# Patient Record
Sex: Female | Born: 1940 | Race: Black or African American | Hispanic: No | Smoking: Former smoker
Health system: Southern US, Community
[De-identification: ages and names within clinical notes are randomized; demographics above are authoritative.]

## PROBLEM LIST (undated history)

## (undated) DIAGNOSIS — R05 Cough: Secondary | ICD-10-CM

## (undated) DIAGNOSIS — F0390 Unspecified dementia without behavioral disturbance: Secondary | ICD-10-CM

## (undated) DIAGNOSIS — T464X5A Adverse effect of angiotensin-converting-enzyme inhibitors, initial encounter: Secondary | ICD-10-CM

## (undated) DIAGNOSIS — M1990 Unspecified osteoarthritis, unspecified site: Secondary | ICD-10-CM

## (undated) DIAGNOSIS — I639 Cerebral infarction, unspecified: Secondary | ICD-10-CM

## (undated) DIAGNOSIS — R911 Solitary pulmonary nodule: Secondary | ICD-10-CM

## (undated) DIAGNOSIS — I10 Essential (primary) hypertension: Secondary | ICD-10-CM

## (undated) DIAGNOSIS — N179 Acute kidney failure, unspecified: Secondary | ICD-10-CM

## (undated) DIAGNOSIS — E785 Hyperlipidemia, unspecified: Secondary | ICD-10-CM

## (undated) DIAGNOSIS — R058 Other specified cough: Secondary | ICD-10-CM

## (undated) DIAGNOSIS — N390 Urinary tract infection, site not specified: Secondary | ICD-10-CM

## (undated) HISTORY — PX: FRACTURE SURGERY: SHX138

## (undated) HISTORY — DX: Essential (primary) hypertension: I10

---

## 1958-09-06 HISTORY — PX: BREAST LUMPECTOMY: SHX2

## 2004-05-17 ENCOUNTER — Emergency Department (HOSPITAL_COMMUNITY): Admission: EM | Admit: 2004-05-17 | Discharge: 2004-05-17 | Payer: Self-pay | Admitting: Emergency Medicine

## 2004-05-20 ENCOUNTER — Emergency Department (HOSPITAL_COMMUNITY): Admission: EM | Admit: 2004-05-20 | Discharge: 2004-05-20 | Payer: Self-pay | Admitting: Emergency Medicine

## 2004-06-18 ENCOUNTER — Encounter: Admission: RE | Admit: 2004-06-18 | Discharge: 2004-07-09 | Payer: Self-pay | Admitting: Orthopedic Surgery

## 2009-11-03 ENCOUNTER — Emergency Department (HOSPITAL_COMMUNITY): Admission: EM | Admit: 2009-11-03 | Discharge: 2009-11-03 | Payer: Self-pay | Admitting: Family Medicine

## 2012-10-09 ENCOUNTER — Emergency Department (INDEPENDENT_AMBULATORY_CARE_PROVIDER_SITE_OTHER)
Admission: EM | Admit: 2012-10-09 | Discharge: 2012-10-09 | Disposition: A | Discharge status: Home / Self Care | Payer: Medicare Other | Source: Home / Self Care | Attending: Emergency Medicine | Admitting: Emergency Medicine

## 2012-10-09 ENCOUNTER — Encounter (HOSPITAL_COMMUNITY): Payer: Self-pay | Admitting: Emergency Medicine

## 2012-10-09 ENCOUNTER — Emergency Department (INDEPENDENT_AMBULATORY_CARE_PROVIDER_SITE_OTHER): Payer: Medicare Other

## 2012-10-09 DIAGNOSIS — J329 Chronic sinusitis, unspecified: Secondary | ICD-10-CM

## 2012-10-09 DIAGNOSIS — J4489 Other specified chronic obstructive pulmonary disease: Secondary | ICD-10-CM

## 2012-10-09 DIAGNOSIS — J449 Chronic obstructive pulmonary disease, unspecified: Secondary | ICD-10-CM

## 2012-10-09 MED ORDER — AMOXICILLIN 500 MG PO CAPS: 500.0000 mg | ORAL_CAPSULE | Freq: Two times a day (BID) | ORAL | Status: DC

## 2012-10-09 MED ORDER — ALBUTEROL SULFATE HFA 108 (90 BASE) MCG/ACT IN AERS: 1.0000 | INHALATION_SPRAY | RESPIRATORY_TRACT | Status: DC | PRN

## 2012-10-09 NOTE — ED Notes (Signed)
Pt c/o cold sx x2 weeks Sx include: productive cough, chest congestion, nauseas, runny nose, nasal congestion Denies: f/v/d Smokes 1-2 cigarettes per day  She is alert w/no signs of acute distress.

## 2012-10-09 NOTE — ED Provider Notes (Signed)
History     CSN: [id number]  Arrival date & time 10/09/12  1022   None     Chief Complaint  Patient presents with  . URI    (Consider location/radiation/quality/duration/timing/severity/associated sxs/prior treatment) HPI Comments: Pt reports doesn't have regular doctor, doesn't usually seek health care, and doesn't have any health problems.  Cough, "cold" for 2.5 weeks. Smoker.  Patient is a 72 y.o. female presenting with cough.  Cough This is a new problem. Episode onset: 2.5 weeks ago. The problem occurs every few minutes. The problem has been gradually worsening. The cough is productive of sputum (sometimes clear, sometimes purulent). There has been no fever. Associated symptoms include rhinorrhea, shortness of breath and wheezing. Pertinent negatives include no chest pain, no chills, no ear congestion, no ear pain, no headaches and no sore throat. She has tried nothing for the symptoms. She is a smoker.    History reviewed. No pertinent past medical history.  History reviewed. No pertinent past surgical history.  History reviewed. No pertinent family history.  History  Substance Use Topics  . Smoking status: Current Every Day Smoker    Types: Cigarettes  . Smokeless tobacco: Not on file  . Alcohol Use: Yes    OB History    Grav Para Term Preterm Abortions TAB SAB Ect Mult Living                  Review of Systems  Constitutional: Negative for fever and chills.  HENT: Positive for congestion, rhinorrhea and sinus pressure. Negative for ear pain, sore throat and postnasal drip.   Respiratory: Positive for cough, shortness of breath and wheezing.   Cardiovascular: Negative for chest pain.  Neurological: Negative for headaches.    Allergies  Review of patient's allergies indicates no known allergies.  Home Medications   Current Outpatient Rx  Name  Route  Sig  Dispense  Refill  . ALBUTEROL SULFATE HFA 108 (90 BASE) MCG/ACT IN AERS   Inhalation   Inhale 1-2  puffs into the lungs every 4 (four) hours as needed for wheezing. Please dispense with spacer and teach patient to use   1 Inhaler   0   . AMOXICILLIN 500 MG PO CAPS   Oral   Take 1 capsule (500 mg total) by mouth 2 (two) times daily.   20 capsule   0     BP 140/75  Pulse 88  Temp 98.4 F (36.9 C) (Oral)  Resp 22  SpO2 93%  Physical Exam  Constitutional: She appears well-developed and well-nourished. No distress.  HENT:  Right Ear: External ear normal.  Left Ear: External ear normal.  Nose: Rhinorrhea present. No mucosal edema. Right sinus exhibits maxillary sinus tenderness. Right sinus exhibits no frontal sinus tenderness. Left sinus exhibits maxillary sinus tenderness. Left sinus exhibits no frontal sinus tenderness.  Mouth/Throat: Oropharynx is clear and moist.       B ear canals with cerumen, unable to see TMs  Cardiovascular: Normal rate and regular rhythm.   Pulmonary/Chest: No respiratory distress. She has rhonchi in the right upper field, the right middle field, the right lower field, the left upper field and the left lower field.    ED Course  Procedures (including critical care time)  Labs Reviewed - No data to display Dg Chest 2 View  10/09/2012  *RADIOLOGY REPORT*  Clinical Data: Cough.  Smoker.  CHEST - 2 VIEW  Comparison: None.  Findings: Trachea is midline.  Heart size normal.  Transverse aorta  appears prominent.  Lungs are hyperinflated but grossly clear.  No pleural fluid.  Old left rib fractures.  IMPRESSION: COPD without acute finding.   Original Report Authenticated By: Leanna Battles, M.D.      1. Sinusitis   2. COPD (chronic obstructive pulmonary disease)       MDM  Pt to find a pcp and f/u about COPD.         Cathlyn Parsons, NP 10/09/12 [phone]

## 2012-10-09 NOTE — ED Provider Notes (Signed)
Medical screening examination/treatment/procedure(s) were performed by non-physician practitioner and as supervising physician I was immediately available for consultation/collaboration.  Leslee Home, M.D.   Reuben Likes, MD 10/09/12 2225

## 2012-10-25 ENCOUNTER — Other Ambulatory Visit (HOSPITAL_COMMUNITY): Payer: Self-pay | Admitting: Internal Medicine

## 2012-10-26 ENCOUNTER — Ambulatory Visit (HOSPITAL_COMMUNITY): Admission: RE | Admit: 2012-10-26 | Payer: Medicare Other | Source: Ambulatory Visit

## 2012-10-30 ENCOUNTER — Encounter (HOSPITAL_COMMUNITY): Payer: Self-pay

## 2012-10-30 ENCOUNTER — Ambulatory Visit (HOSPITAL_COMMUNITY)
Admission: RE | Admit: 2012-10-30 | Discharge: 2012-10-30 | Disposition: A | Discharge status: Home / Self Care | Payer: Medicare Other | Source: Ambulatory Visit | Attending: Internal Medicine | Admitting: Internal Medicine

## 2012-10-30 DIAGNOSIS — R05 Cough: Secondary | ICD-10-CM | POA: Insufficient documentation

## 2012-10-30 DIAGNOSIS — R634 Abnormal weight loss: Secondary | ICD-10-CM | POA: Insufficient documentation

## 2012-10-30 DIAGNOSIS — R059 Cough, unspecified: Secondary | ICD-10-CM | POA: Insufficient documentation

## 2012-10-30 DIAGNOSIS — R911 Solitary pulmonary nodule: Secondary | ICD-10-CM | POA: Insufficient documentation

## 2012-10-30 LAB — COMPREHENSIVE METABOLIC PANEL
ALT: 6 U/L (ref 0–35)
AST: 17 U/L (ref 0–37)
Albumin: 3.7 g/dL (ref 3.5–5.2)
Alkaline Phosphatase: 64 U/L (ref 39–117)
BUN: 15 mg/dL (ref 6–23)
CO2: 25 mEq/L (ref 19–32)
Creatinine, Ser: 0.78 mg/dL (ref 0.50–1.10)
Glucose, Bld: 79 mg/dL (ref 70–99)
Total Bilirubin: 0.5 mg/dL (ref 0.3–1.2)

## 2012-10-30 LAB — CBC
HCT: 33.7 % — ABNORMAL LOW (ref 36.0–46.0)
Hemoglobin: 11.6 g/dL — ABNORMAL LOW (ref 12.0–15.0)
MCV: 81.8 fL (ref 78.0–100.0)
RDW: 14.9 % (ref 11.5–15.5)
WBC: 3.2 10*3/uL — ABNORMAL LOW (ref 4.0–10.5)

## 2012-10-30 LAB — LIPID PANEL
Triglycerides: 53 mg/dL (ref <150)
VLDL: 11 mg/dL (ref 0–40)

## 2012-10-30 MED ORDER — IOHEXOL 300 MG/ML  SOLN
80.0000 mL | Freq: Once | INTRAMUSCULAR | Status: AC | PRN
  Administered 2012-10-30: 80 mL via INTRAVENOUS

## 2012-11-03 ENCOUNTER — Encounter: Payer: Self-pay | Admitting: Internal Medicine

## 2012-11-03 ENCOUNTER — Ambulatory Visit (INDEPENDENT_AMBULATORY_CARE_PROVIDER_SITE_OTHER): Payer: Medicare Other | Admitting: Internal Medicine

## 2012-11-03 VITALS — BP 120/80 | HR 57 | Temp 97.0°F | Ht 64.0 in | Wt 116.0 lb

## 2012-11-03 DIAGNOSIS — I10 Essential (primary) hypertension: Secondary | ICD-10-CM

## 2012-11-03 DIAGNOSIS — R059 Cough, unspecified: Secondary | ICD-10-CM

## 2012-11-03 DIAGNOSIS — R911 Solitary pulmonary nodule: Secondary | ICD-10-CM

## 2012-11-03 MED ORDER — LOSARTAN POTASSIUM 50 MG PO TABS: 50.0000 mg | ORAL_TABLET | Freq: Every day | ORAL | Status: DC

## 2012-11-03 NOTE — Patient Instructions (Addendum)
Stop lisinopril  Losartan 50 mg one daily in place of lisinopril  - ok to break in half if too light headed standing  Please schedule a follow up visit in 3 months but call sooner if needed with pft's and cxr

## 2012-11-03 NOTE — Progress Notes (Signed)
  Subjective:    Patient ID: Kendra Nixon, female    DOB: November 03, 1940   MRN: [id number]  HPI  79 yobf quit smoking 10/27/12 p onset of cough Feb 2014 referred by Dr Lovell Sheehan 11/03/2012 to pulmonary clinic for ct chest which was done to w/u the cough and showed a RUL nodule   11/03/2012 1st pulmonary eval cc acute onset cough early February 2013 rx with purulent sputum (never bloody)  But since abx rx developed dry hacking cough while on lisinopril which was subsequently started though not sure of the timing. Now has sense of constant daytime tickle but not a nocturnal complaint. Not limited  By sob but not very active  No obvious daytime variabilty or assoc sob  cp or chest tightness, subjective wheeze overt sinus or hb symptoms. No unusual exp hx or h/o childhood pna/ asthma or premature birth to her knowledge.   Sleeping ok without nocturnal  or early am exacerbation  of respiratory  c/o's or need for noct saba. Also denies any obvious fluctuation of symptoms with weather or environmental changes or other aggravating or alleviating factors except as outlined above   Review of Systems  Constitutional: Positive for unexpected weight change. Negative for fever and chills.  HENT: Positive for rhinorrhea. Negative for ear pain, nosebleeds, congestion, sore throat, sneezing, trouble swallowing, dental problem, voice change, postnasal drip and sinus pressure.   Eyes: Negative for visual disturbance.  Respiratory: Positive for cough. Negative for choking and shortness of breath.   Cardiovascular: Negative for chest pain and leg swelling.  Gastrointestinal: Negative for vomiting, abdominal pain and diarrhea.  Genitourinary: Negative for difficulty urinating.  Musculoskeletal: Negative for arthralgias.  Skin: Negative for rash.  Neurological: Negative for tremors, syncope and headaches.  Hematological: Does not bruise/bleed easily.       Objective:   Physical Exam  amb thin bf nad Wt Readings  from Last 3 Encounters:  11/03/12 116 lb (52.617 kg)    HEENT mild turbinate edema.  Oropharynx no thrush or excess pnd or cobblestoning.  No JVD or cervical adenopathy. Mild accessory muscle hypertrophy. Trachea midline, nl thryroid. Chest was hyperinflated by percussion with diminished breath sounds and moderate increased exp time without wheeze. Hoover sign positive at mid inspiration. Regular rate and rhythm without murmur gallop or rub or increase P2 or edema.  Abd: no hsm, nl excursion. Ext warm without cyanosis or clubbing.    CT 10/26/11 1. Irregular scar like density in the right apex is noted. I  would advise short-term follow-up examination in 3 months to  confirm stability of this abnormality.  2. 4 mm right upper lobe nodule. Attention on follow-up exam is  advised.        Assessment & Plan:

## 2012-11-04 NOTE — Assessment & Plan Note (Signed)
-   See CT chest  10/26/11 not viz on plain cxr  Although there are clearly abnormalities on CT scan, they should probably be considered "microscopic" since not obvious on plain cxr .     In the setting of obvious "macroscopic" health issues,  I am very reluctatnt to embark on an invasive w/u at this point but will arrange consevative  follow up and in the meantime see what we can do to address the patient's subjective concerns (persistent daytime dry cough, discussed separately)

## 2012-11-04 NOTE — Assessment & Plan Note (Signed)
ACE inhibitors are problematic in  pts with airway complaints because  even experienced pulmonologists can't always distinguish ace effects from copd/asthma.  By themselves they don't actually cause a problem, much like oxygen can't by itself start a fire, but they certainly serve as a powerful catalyst or enhancer for any "fire"  or inflammatory process in the upper airway, be it caused by an ET  tube or more commonly reflux (especially in the obese or pts with known GERD or who are on biphoshonates).    In the era of ARB near equivalency until we have a better handle on the reversibility of the airway problem, it just makes sense to avoid ACEI  entirely in the short run and then decide later, having established a level of airway control using a reasonable limited regimen, whether to add back ace but even then being very careful to observe the pt for worsening airway control and number of meds used/ needed to control symptoms.    See instructions for specific recommendations which were reviewed directly with the patient who was given a copy with highlighter outlining the key components.

## 2012-11-04 NOTE — Assessment & Plan Note (Addendum)
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   These are the same pts (now being labeled as having "irritable larynx syndrome" by some cough centers) who not infrequently have a history of having failed to tolerate ace inhibitors (as may be the case here)  dry powder inhalers or biphosphonates or report having atypical reflux symptoms that don't respond to standard doses of PPI , and are easily confused as having aecopd or asthma flares by even experienced allergists/ pulmonologists.   Will try off ace first then regroup

## 2013-01-10 ENCOUNTER — Telehealth: Payer: Self-pay | Admitting: Internal Medicine

## 2013-01-10 NOTE — Telephone Encounter (Signed)
Called pt x's 3 to make next ov per recall.  Pt never returned calls.  Mailed recall letter 01/10/13. Kendra Nixon °

## 2013-01-29 ENCOUNTER — Emergency Department (HOSPITAL_COMMUNITY): Payer: Medicare Other

## 2013-01-29 ENCOUNTER — Encounter (HOSPITAL_COMMUNITY): Payer: Self-pay | Admitting: Emergency Medicine

## 2013-01-29 ENCOUNTER — Observation Stay (HOSPITAL_COMMUNITY)
Admission: EM | Admit: 2013-01-29 | Discharge: 2013-01-31 | Disposition: A | Discharge status: Home / Self Care | Payer: Medicare Other | Attending: Internal Medicine | Admitting: Internal Medicine

## 2013-01-29 DIAGNOSIS — E785 Hyperlipidemia, unspecified: Secondary | ICD-10-CM | POA: Insufficient documentation

## 2013-01-29 DIAGNOSIS — I498 Other specified cardiac arrhythmias: Secondary | ICD-10-CM | POA: Insufficient documentation

## 2013-01-29 DIAGNOSIS — Z8673 Personal history of transient ischemic attack (TIA), and cerebral infarction without residual deficits: Secondary | ICD-10-CM

## 2013-01-29 DIAGNOSIS — Z9181 History of falling: Secondary | ICD-10-CM | POA: Insufficient documentation

## 2013-01-29 DIAGNOSIS — R911 Solitary pulmonary nodule: Secondary | ICD-10-CM | POA: Diagnosis present

## 2013-01-29 DIAGNOSIS — R001 Bradycardia, unspecified: Secondary | ICD-10-CM | POA: Diagnosis present

## 2013-01-29 DIAGNOSIS — I440 Atrioventricular block, first degree: Secondary | ICD-10-CM | POA: Diagnosis present

## 2013-01-29 DIAGNOSIS — F17200 Nicotine dependence, unspecified, uncomplicated: Secondary | ICD-10-CM | POA: Insufficient documentation

## 2013-01-29 DIAGNOSIS — I10 Essential (primary) hypertension: Secondary | ICD-10-CM | POA: Diagnosis present

## 2013-01-29 DIAGNOSIS — I491 Atrial premature depolarization: Secondary | ICD-10-CM | POA: Diagnosis present

## 2013-01-29 DIAGNOSIS — R55 Syncope and collapse: Principal | ICD-10-CM | POA: Diagnosis present

## 2013-01-29 HISTORY — DX: Adverse effect of angiotensin-converting-enzyme inhibitors, initial encounter: T46.4X5A

## 2013-01-29 HISTORY — DX: Solitary pulmonary nodule: R91.1

## 2013-01-29 HISTORY — DX: Hyperlipidemia, unspecified: E78.5

## 2013-01-29 HISTORY — DX: Other specified cough: R05.8

## 2013-01-29 HISTORY — DX: Cough: R05

## 2013-01-29 LAB — CBC WITH DIFFERENTIAL/PLATELET
Eosinophils Absolute: 0.1 10*3/uL (ref 0.0–0.7)
Eosinophils Relative: 2 % (ref 0–5)
HCT: 40.2 % (ref 36.0–46.0)
Hemoglobin: 13.7 g/dL (ref 12.0–15.0)
MCHC: 34.1 g/dL (ref 30.0–36.0)
MCV: 83.4 fL (ref 78.0–100.0)
Monocytes Absolute: 0.3 10*3/uL (ref 0.1–1.0)
Platelets: 153 10*3/uL (ref 150–400)
RDW: 14.5 % (ref 11.5–15.5)

## 2013-01-29 LAB — COMPREHENSIVE METABOLIC PANEL
ALT: 7 U/L (ref 0–35)
AST: 19 U/L (ref 0–37)
Albumin: 3.5 g/dL (ref 3.5–5.2)
BUN: 23 mg/dL (ref 6–23)
Calcium: 9.7 mg/dL (ref 8.4–10.5)
Glucose, Bld: 92 mg/dL (ref 70–99)
Total Bilirubin: 0.3 mg/dL (ref 0.3–1.2)

## 2013-01-29 LAB — TROPONIN I: Troponin I: <0.3 ng/mL (ref <0.30)

## 2013-01-29 LAB — PROTIME-INR: INR: 0.96 (ref 0.00–1.49)

## 2013-01-29 MED ORDER — ASPIRIN EC 81 MG PO TBEC
81.0000 mg | DELAYED_RELEASE_TABLET | Freq: Every day | ORAL | Status: DC
  Administered 2013-01-30 – 2013-01-31 (×2): 81 mg via ORAL
  Filled 2013-01-29 (×2): qty 1

## 2013-01-29 MED ORDER — SODIUM CHLORIDE 0.9 % IJ SOLN: 3.0000 mL | Freq: Two times a day (BID) | INTRAMUSCULAR | Status: DC

## 2013-01-29 MED ORDER — ATORVASTATIN CALCIUM 20 MG PO TABS: 20.0000 mg | ORAL_TABLET | Freq: Every day | ORAL | Status: DC

## 2013-01-29 MED ORDER — ASPIRIN 81 MG PO CHEW: 81.0000 mg | CHEWABLE_TABLET | Freq: Once | ORAL | Status: DC

## 2013-01-29 MED ORDER — LOSARTAN POTASSIUM 50 MG PO TABS: 50.0000 mg | ORAL_TABLET | Freq: Every day | ORAL | Status: DC

## 2013-01-29 MED ORDER — ASPIRIN 325 MG PO TABS
325.0000 mg | ORAL_TABLET | Freq: Once | ORAL | Status: AC
  Administered 2013-01-29: 325 mg via ORAL
  Filled 2013-01-29: qty 1

## 2013-01-29 MED ORDER — ENOXAPARIN SODIUM 40 MG/0.4ML ~~LOC~~ SOLN
40.0000 mg | SUBCUTANEOUS | Status: DC
  Administered 2013-01-30: 40 mg via SUBCUTANEOUS
  Filled 2013-01-29 (×2): qty 0.4

## 2013-01-29 MED ORDER — SODIUM CHLORIDE 0.9 % IV SOLN: 1000.0000 mL | INTRAVENOUS | Status: DC

## 2013-01-29 MED ORDER — SODIUM CHLORIDE 0.9 % IJ SOLN
3.0000 mL | Freq: Two times a day (BID) | INTRAMUSCULAR | Status: DC
  Administered 2013-01-29 – 2013-01-30 (×2): 3 mL via INTRAVENOUS

## 2013-01-29 MED ORDER — ENOXAPARIN SODIUM 40 MG/0.4ML ~~LOC~~ SOLN
40.0000 mg | SUBCUTANEOUS | Status: DC
  Administered 2013-01-29: 40 mg via SUBCUTANEOUS
  Filled 2013-01-29: qty 0.4

## 2013-01-29 MED ORDER — SODIUM CHLORIDE 0.9 % IV SOLN: 250.0000 mL | INTRAVENOUS | Status: DC | PRN

## 2013-01-29 MED ORDER — SODIUM CHLORIDE 0.9 % IJ SOLN: 3.0000 mL | INTRAMUSCULAR | Status: DC | PRN

## 2013-01-29 MED ORDER — ATORVASTATIN CALCIUM 20 MG PO TABS
20.0000 mg | ORAL_TABLET | Freq: Every day | ORAL | Status: DC
  Administered 2013-01-30 – 2013-01-31 (×3): 20 mg via ORAL
  Filled 2013-01-29 (×3): qty 1

## 2013-01-29 MED ORDER — SODIUM CHLORIDE 0.9 % IV SOLN
1000.0000 mL | INTRAVENOUS | Status: DC
  Administered 2013-01-29: 1000 mL via INTRAVENOUS

## 2013-01-29 NOTE — ED Notes (Signed)
Pt was sitting outside and had a syncopal episode.  Neighbor reports she fell over and was shaking.  Unsure about LOC.  Pt does not remember the event. Unsure of medications and history.

## 2013-01-29 NOTE — ED Provider Notes (Signed)
History     CSN: [id number]  Arrival date & time 01/29/13  1501   None     Chief Complaint  Patient presents with  . Near Syncope    (Consider location/radiation/quality/duration/timing/severity/associated sxs/prior treatment) Patient is a 72 y.o. female presenting with syncope. The history is provided by the patient. No language interpreter was used.  Loss of Consciousness Episode history:  Single (Patient is a 72 year old woman who says that she was going down some stairs, got" half blind", and "went out.". Neighbors called EMS and she was brought to the EP. She says initially she felt nausea when she woke up but now feels all right.) Most recent episode:  Today Duration: Unknown duration of unconsciousness. Timing:  Unable to specify Progression:  Resolved Chronicity:  New Context comment:  No known precipitating cause. Witnessed: Neighbors first noticed her fainting spell, but did not accompany her to the hospital.   Relieved by:  Nothing Worsened by:  Nothing tried Associated symptoms: no chest pain, no fever and no palpitations   Risk factors comment:  Patient was transported to [facility] by EMS.   Past Medical History  Diagnosis Date  . Hypertension     History reviewed. No pertinent past surgical history.  Family History  Problem Relation Age of Onset  . Breast cancer Sister     History  Substance Use Topics  . Smoking status: Former Smoker -- 0.25 packs/day for 30 years    Types: Cigarettes    Quit date: 10/27/2012  . Smokeless tobacco: Never Used  . Alcohol Use: No    OB History   Grav Para Term Preterm Abortions TAB SAB Ect Mult Living                  Review of Systems  Constitutional: Negative for fever and chills.  HENT: Negative.   Eyes: Negative.   Respiratory: Negative.   Cardiovascular: Positive for syncope. Negative for chest pain and palpitations.       Syncope  Gastrointestinal: Negative.   Genitourinary: Negative.    Musculoskeletal: Negative.   Skin: Negative.   Neurological: Positive for syncope.  Psychiatric/Behavioral: Negative.     Allergies  Review of patient's allergies indicates no known allergies.  Home Medications   Current Outpatient Rx  Name  Route  Sig  Dispense  Refill  . albuterol (PROVENTIL HFA;VENTOLIN HFA) 108 (90 BASE) MCG/ACT inhaler   Inhalation   Inhale 1-2 puffs into the lungs every 4 (four) hours as needed for wheezing. Please dispense with spacer and teach patient to use   1 Inhaler   0   . losartan (COZAAR) 50 MG tablet   Oral   Take 1 tablet (50 mg total) by mouth daily.   30 tablet   11     BP 142/76  Pulse 65  Resp 17  Physical Exam  Nursing note and vitals reviewed. Constitutional: She is oriented to person, place, and time.  Slender elderly woman in no distress. She has no recall of her syncope.  HENT:  Head: Normocephalic and atraumatic.  Right Ear: External ear normal.  Left Ear: External ear normal.  Mouth/Throat: Oropharynx is clear and moist.  Eyes: Conjunctivae and EOM are normal. Pupils are equal, round, and reactive to light.  Neck: Normal range of motion. Neck supple.  Cardiovascular: Normal rate, regular rhythm and normal heart sounds.   Pulmonary/Chest: Effort normal and breath sounds normal.  Abdominal: Soft. Bowel sounds are normal.  Musculoskeletal: Normal range  of motion.  Neurological: She is alert and oriented to person, place, and time.  No sensory or motor deficit.  Skin: Skin is warm and dry.  Psychiatric: She has a normal mood and affect. Her behavior is normal.    ED Course  Procedures (including critical care time)  3:25 PM  Date: 01/29/2013  Rate: 65  Rhythm: normal sinus rhythm  QRS Axis: left  Intervals: PR prolonged QRS:  Poor R wave progression in precordial leads suggests possible old anterior myocardial infarction.   ST/T Wave abnormalities: normal  Conduction Disutrbances:none  Narrative Interpretation:  Abnormal EKG  Old EKG Reviewed: none available   3:39 PM Pt was seen and had physical exam.  Lab workup was ordered.   7:28 PM Results for orders placed during the hospital encounter of 01/29/13  CBC WITH DIFFERENTIAL      Result Value Range   WBC 5.0  4.0 - 10.5 K/uL   RBC 4.82  3.87 - 5.11 MIL/uL   Hemoglobin 13.7  12.0 - 15.0 g/dL   HCT 40.9  81.1 - 91.4 %   MCV 83.4  78.0 - 100.0 fL   MCH 28.4  26.0 - 34.0 pg   MCHC 34.1  30.0 - 36.0 g/dL   RDW 78.2  95.6 - 21.3 %   Platelets 153  150 - 400 K/uL   Neutrophils Relative % 74  43 - 77 %   Neutro Abs 3.7  1.7 - 7.7 K/uL   Lymphocytes Relative 18  12 - 46 %   Lymphs Abs 0.9  0.7 - 4.0 K/uL   Monocytes Relative 6  3 - 12 %   Monocytes Absolute 0.3  0.1 - 1.0 K/uL   Eosinophils Relative 2  0 - 5 %   Eosinophils Absolute 0.1  0.0 - 0.7 K/uL   Basophils Relative 0  0 - 1 %   Basophils Absolute 0.0  0.0 - 0.1 K/uL  COMPREHENSIVE METABOLIC PANEL      Result Value Range   Sodium 140  135 - 145 mEq/L   Potassium 4.1  3.5 - 5.1 mEq/L   Chloride 102  96 - 112 mEq/L   CO2 25  19 - 32 mEq/L   Glucose, Bld 92  70 - 99 mg/dL   BUN 23  6 - 23 mg/dL   Creatinine, Ser 0.86  0.50 - 1.10 mg/dL   Calcium 9.7  8.4 - 57.8 mg/dL   Total Protein 7.2  6.0 - 8.3 g/dL   Albumin 3.5  3.5 - 5.2 g/dL   AST 19  0 - 37 U/L   ALT 7  0 - 35 U/L   Alkaline Phosphatase 84  39 - 117 U/L   Total Bilirubin 0.3  0.3 - 1.2 mg/dL   GFR calc non Af Amer 80 (*) >90 mL/min   GFR calc Af Amer >90  >90 mL/min  PROTIME-INR      Result Value Range   Prothrombin Time 12.7  11.6 - 15.2 seconds   INR 0.96  0.00 - 1.49  POCT I-STAT TROPONIN I      Result Value Range   Troponin i, poc 0.00  0.00 - 0.08 ng/mL   Comment 3            Dg Chest 2 View  01/29/2013   *RADIOLOGY REPORT*  Clinical Data: Near-syncope.  CHEST - 2 VIEW  Comparison: 10/09/2012.  Findings: The cardiac silhouette, mediastinal and hilar contours are within normal limits and  stable.  There is  tortuosity of the thoracic aorta.  Stable apical scarring changes on the right.  No acute pulmonary findings.  No pleural effusion.  The bony thorax is intact.  IMPRESSION: No acute cardiopulmonary findings.   Original Report Authenticated By: Rudie Meyer, M.D.   Ct Head Wo Contrast  01/29/2013   *RADIOLOGY REPORT*  Clinical Data: Near-syncope  CT HEAD WITHOUT CONTRAST  Technique:  Contiguous axial images were obtained from the base of the skull through the vertex without contrast.  Comparison: None.  Findings: No evidence of parenchymal hemorrhage or extra-axial fluid collection. No mass lesion, mass effect, or midline shift.  No CT evidence of acute infarction.  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  Extensive small vessel ischemic changes.  Mild intracranial atherosclerosis.  The visualized paranasal sinuses are essentially clear. The mastoid air cells are unopacified.  No evidence of calvarial fracture.  IMPRESSION: No evidence of acute intracranial abnormality.  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  Atrophy with extensive small vessel ischemic changes.   Original Report Authenticated By: Charline Bills, M.D.   7:28 PM Lab tests were reassuringly negative.  Pt is awake and alert, with no complaints.  I will ask unassigned medicine to admit her for observation post syncope.  7:40 PM Case discussed with Dr. Dierdre Searles, who will see pt.  1. Syncope           Carleene Cooper III, MD 01/29/13 [phone]

## 2013-01-29 NOTE — H&P (Signed)
Date: 01/29/2013               Patient Name:  Kendra Nixon MRN: [id number]  DOB: 02-05-1941 Age / Sex: 72 y.o., female   PCP: [name], MD              Medical Service: Internal Medicine Teaching Service              Attending Physician: Dr. Rogelia Boga    First Contact: Dr. Shirlee Latch Pager: [phone]  Second Contact: Dr. Dierdre Searles Pager: [phone]            After Hours (After 5p/  First Contact Pager: [phone]  weekends / holidays): Second Contact Pager: [phone]      Chief Complaint: Syncope  History of Present Illness: Patient is a 72 y.o. female with a PMHx of hypertension, who presents to [facility] for evaluation of syncope. Patient indicates that she was outside at a cookout for approximately 2 hours (she ate well, however, did not drink any fluids during that time). Pt went upstairs to put some items into her apartment. She walked downstairs, started to experience some blurriness of vision without dizziness, lightheadedness, chest pain, palpitations, shortness of breath. The patient sat down and was found to have passed out for approximately 2 minutes, noted by friends. Immediately after she came to, the patient felt back to her baseline. After the episode, the patient experienced some nausea without vomiting, chest pain, shortness of breath. She has otherwise been well without fevers, chills.   Review of Systems: Constitutional:  denies fever, chills, diaphoresis, appetite change and fatigue.  HEENT: admits to blurriness of vision. Denies eye pain, hearing loss, ear pain, ear discharge, nasal congestion, sore throat.  Respiratory: denies SOB, DOE, cough, chest tightness, and wheezing.  Cardiovascular: denies chest pain, palpitations and leg swelling.  Gastrointestinal: admits to nausea. Denies vomiting, abdominal pain, diarrhea, constipation, blood in stool.  Genitourinary: denies dysuria, urgency, frequency, hematuria, flank pain.  Musculoskeletal: admits to chronic  arthralgias in her BL knees and ankles (recently increased her exercise) Denies myalgias, back pain, joint swelling, and gait problem.   Skin: denies rash and wound.  Neurological: admits to syncope on day of admission without recurrence. Denies dizziness, seizures, weakness, light-headedness, numbness and headaches.   Hematological: denies easy bruising, personal or family bleeding history.  Psychiatric: denies anxiety, depression, suicidal / homicidal ideation.    Current Outpatient Medications: Medication Sig  . losartan (COZAAR) 50 MG tablet Take 50 mg by mouth daily.    Allergies: No Known Allergies   Past Medical History: Past Medical History  Diagnosis Date  . Hypertension   . ACE-inhibitor cough   . History of CVA (cerebrovascular accident)     Old left basal ganglion lacunar infarct noted per CT head (01/2013)  . Solitary lung nodule     4 mm right upper lobe nodule  . Hyperlipidemia LDL goal < 100     Past Surgical History: Past Surgical History  Procedure Laterality Date  . Breast lumpectomy Bilateral age 23    Family History: Family History  Problem Relation Age of Onset  . Breast cancer Sister 58  . Hypertension    . Alzheimer's disease Maternal Grandmother   . Alzheimer's disease Maternal Aunt     Social History: History   Social History  . Marital Status: Single    Spouse Name: N/A    Number of Children: 3  . Years of Education: 11th grade   Occupational History  .  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 Smoking status: Former Smoker -- 0.25 packs/day for 30 years    Types: Cigarettes    Quit date: 10/27/2012  . Smokeless tobacco: Never Used  . Alcohol Use: No  . Drug Use: No  . Sexually Active: Not on file   Other Topics Concern  . Not on file   Social History Narrative   Lives in [place] alone in an independent living center. Is able to perform all ADLS and IADLs  independently.     Vital Signs: Blood pressure 139/78, pulse 56, temperature 98.3 F (36.8 C), temperature source Oral, resp. rate 18, height 5\' 6"  (1.676 m), weight 114 lb 10.2 oz (52 kg), SpO2 98.00%.  Physical Exam: General: Vital signs reviewed and noted. Well-developed, well-nourished, in no acute distress; alert, appropriate and cooperative throughout examination. Family at bedside  Head: Normocephalic, atraumatic.  Eyes: PERRL, EOMI, No signs of anemia or jaundince.  Nose: Mucous membranes moist, not inflammed, nonerythematous.  Throat: Oropharynx nonerythematous, no exudate appreciated.   Neck: No deformities, masses, or tenderness noted. Supple, No carotid Bruits, no JVD.  Lungs:  Normal respiratory effort. Clear to auscultation BL without crackles or wheezes.  Heart: RRR. S1 and S2 normal without gallop, murmur, or rubs.  Abdomen:  BS normoactive. Soft, Nondistended, non-tender.  No masses or organomegaly.  Extremities: No pretibial edema.  Neurologic: A&O X3, CN II - XII are grossly intact. Motor strength is 5/5 in the all 4 extremities, Sensations intact to light touch, Cerebellar signs negative.  Skin: No visible rashes, scars.    Lab results:  CURRENT LABS: CBC:    Component Value Date/Time   WBC 5.0 01/29/2013 1714   HGB 13.7 01/29/2013 1714   HCT 40.2 01/29/2013 1714   PLT 153 01/29/2013 1714   MCV 83.4 01/29/2013 1714   NEUTROABS 3.7 01/29/2013 1714   LYMPHSABS 0.9 01/29/2013 1714   MONOABS 0.3 01/29/2013 1714   EOSABS 0.1 01/29/2013 1714   BASOSABS 0.0 01/29/2013 1714     Metabolic Panel:    Component Value Date/Time   NA 140 01/29/2013 1714   K 4.1 01/29/2013 1714   CL 102 01/29/2013 1714   CO2 25 01/29/2013 1714   BUN 23 01/29/2013 1714   CREATININE 0.79 01/29/2013 1714   GLUCOSE 92 01/29/2013 1714   CALCIUM 9.7 01/29/2013 1714   AST 19 01/29/2013 1714   ALT 7 01/29/2013 1714   ALKPHOS 84 01/29/2013 1714   BILITOT 0.3 01/29/2013 1714   PROT 7.2 01/29/2013 1714    ALBUMIN 3.5 01/29/2013 1714     Troponin (Point of Care Test)  Recent Labs  01/29/13 1737  TROPIPOC 0.00    Recent Labs Lab 01/29/13 2116  TROPONINI <0.30  No results found for this basename: PROBNP,  in the last 168 hours   HISTORICAL LABS: Lipid Panel Lab Results  Component Value Date   CHOL 268* 10/30/2012   HDL 68 10/30/2012   LDLCALC 189* 10/30/2012   TRIG 53 10/30/2012   CHOLHDL 3.9 10/30/2012    Lab Results  Component Value Date   TSH 1.573 10/30/2012     Imaging results:   Dg Chest 2 View (01/29/2013) - No acute cardiopulmonary findings.   Original Report Authenticated By: Rudie Meyer, M.D.   Ct Head Wo Contrast (01/29/2013) -  No evidence of acute intracranial abnormality.  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  Atrophy with extensive small vessel ischemic changes.   Original Report Authenticated By: Charline Bills, M.D.    Assessment & Plan:  Pt is a 72 y.o. yo female with a PMHx of hypertension, apparent remote left lacunar BG infarct per CT who was admitted on 01/29/2013 with episodes of syncope on the day of admission.     Principal Problem:   Syncope Active Problems:   Solitary pulmonary nodule   Hypertension   Sinus bradycardia   History of CVA (cerebrovascular accident)   First degree heart block    1) Syncope - exact cause unclear, likely secondary to orthostasis sustained due to volume depletion (no fluid intake on day of admission, outside for several hours, eating a lot of high salt foods) while still taking her BP medication (apparently was noted by EMS to be hypotensive). However, pt is also noted to have significant asymptomatic bradycardia during the ED course (to 48 bpm) with 1st degree AV block noted on admission - which of concern. Therefore, will need to be monitored overnight on telemetry to assess for progression of this heart block. Lastly, given her age, she may have baseline mild autonomic instability that may have been worsened 2/2  volume depletion / hypotension.  Plan: - Admit to telemetry. - Cycle cardiac enzymes. - Check AM EKG. - Orthostatic vital signs. - Given 1 L IVF in ED, continued on 125 cc/hr, will now dc as pt becoming hypertensive.  2) First degree heart block and asymptomatic bradycardia - unknown baseline, not on any medications to contribute to this. May be related to age, however, with her recent syncopal event, we must monitor closely for progression of heart block. - Avoid AV nodal blocking agents. - Consider cardiology consult if dynamic changes. - Check 2D echo.   3) Hypertension - hypotension noted on EMS arrival (per nursing note). Given initial concern for volume depletion, will hold antihypertensive initially, monitor closely. - DC IVF for now. - If remains elevated after IVF discontinued, will consider resume her ARB. - Hold any AV nodal blocking agents in setting of #2.  4) Apparent history of CVA - CT head shows remote left BG lacunar infarcts and extensive microvascular ischemic changes, although pt is unaware of this history and does not have significant residual deficits - however, she does have to intermittently use a cane. - Start aspirin. - Start statin therapy, particularly given recent LDL of 168. - Prior to discharge, consider PT / OT  to evaluate baseline deficits, need for equipment (as pt does not live on 1st floor, occasionally requires cane, lives alone) - defer until evaluation of #2. - Will need to discuss the CT findings with the pt tomorrow and her willingness to continue statin/ aspirin at discharge.  5) History of RUL solitary lung nodule - has been evaluated by Dr. Sherene Sires as recently as 10/2012 with decision to defer invasive workup unless large health issues arise. Pulmonary status stable per pt. - Continue followup with Dr. Sherene Sires.     DVT PPX - low molecular weight heparin  CODE STATUS - Full  CONSULTS PLACED - None  DISPO - Disposition is deferred at this  time, awaiting evaluation of cause of syncope.   Anticipated discharge in approximately 1-2 day(s).   The patient does have a current PCP (Harvette Velora Heckler, MD) and does not need an [facility] hospital follow-up appointment after discharge.    Is the [facility] hospital follow-up appointment a one-time only appointment? not applicable.  Does the patient have  transportation limitations that hinder transportation to clinic appointments? no.   Signed: Dow Adolph, MD  PGY-1, Internal Medicine Resident Pager: [phone] (7PM-7AM) 01/29/2013, 11:52 PM

## 2013-01-29 NOTE — ED Notes (Signed)
Attempted to call report x 1  

## 2013-01-30 DIAGNOSIS — R55 Syncope and collapse: Principal | ICD-10-CM

## 2013-01-30 DIAGNOSIS — I491 Atrial premature depolarization: Secondary | ICD-10-CM | POA: Diagnosis present

## 2013-01-30 LAB — URINALYSIS, ROUTINE W REFLEX MICROSCOPIC
Glucose, UA: NEGATIVE mg/dL
Ketones, ur: NEGATIVE mg/dL
Leukocytes, UA: NEGATIVE
Protein, ur: NEGATIVE mg/dL

## 2013-01-30 LAB — COMPREHENSIVE METABOLIC PANEL
ALT: 7 U/L (ref 0–35)
AST: 17 U/L (ref 0–37)
Alkaline Phosphatase: 72 U/L (ref 39–117)
CO2: 22 mEq/L (ref 19–32)
Calcium: 8.9 mg/dL (ref 8.4–10.5)
Chloride: 106 mEq/L (ref 96–112)
GFR calc Af Amer: >90 mL/min (ref >90)
GFR calc non Af Amer: 85 mL/min — ABNORMAL LOW (ref >90)
Glucose, Bld: 92 mg/dL (ref 70–99)
Sodium: 138 mEq/L (ref 135–145)
Total Bilirubin: 0.3 mg/dL (ref 0.3–1.2)

## 2013-01-30 LAB — CBC
Hemoglobin: 12.3 g/dL (ref 12.0–15.0)
MCH: 28.5 pg (ref 26.0–34.0)
MCHC: 34.5 g/dL (ref 30.0–36.0)
Platelets: 153 10*3/uL (ref 150–400)
RDW: 14.5 % (ref 11.5–15.5)

## 2013-01-30 LAB — TROPONIN I: Troponin I: <0.3 ng/mL (ref <0.30)

## 2013-01-30 NOTE — Evaluation (Signed)
Occupational Therapy Evaluation Patient Details Name: Selda Jalbert MRN: [id number] DOB: 12/28/1940 Today's Date: 01/30/2013 Time: 4098-1191 OT Time Calculation (min): 24 min  OT Assessment / Plan / Recommendation Clinical Impression  Pt is a 72 yr old female admitted after syncopal episode of unknown origin.  Pt currently resides in a senior living apartment at [facility].  She presents at a modified independent to independent level for selfcare tasks and functional transfers.  Pt did not exhibit any episodes of dizziness or unsteadiness with simulated activites.  No further OT needs.    OT Assessment  Patient does not need any further OT services    Follow Up Recommendations  No OT follow up       Equipment Recommendations  None recommended by OT          Precautions / Restrictions Precautions Precautions: None Restrictions Weight Bearing Restrictions: No   Pertinent Vitals/Pain Pt with no report of pain O2 sats 99% and HR 66 BPM    ADL  Eating/Feeding: Simulated;Independent Where Assessed - Eating/Feeding: Edge of bed Grooming: Simulated;Independent Where Assessed - Grooming: Unsupported standing Upper Body Bathing: Simulated Where Assessed - Upper Body Bathing: Unsupported sitting Lower Body Bathing: Modified independent Where Assessed - Lower Body Bathing: Unsupported sit to stand Upper Body Dressing: Simulated;Independent Where Assessed - Upper Body Dressing: Unsupported sitting Lower Body Dressing: Simulated;Modified independent Where Assessed - Lower Body Dressing: Unsupported sit to stand Toilet Transfer: Performed;Modified independent Toilet Transfer Method: Other (comment) (ambulate without assistive device) Toilet Transfer Equipment: Comfort height toilet;Grab bars Toileting - Clothing Manipulation and Hygiene: Simulated;Modified independent Where Assessed - Toileting Clothing Manipulation and Hygiene: Sit to stand from 3-in-1 or toilet Tub/Shower Transfer:  Simulated;Modified independent Tub/Shower Transfer Method: Other (comment) (stepping over edge of simulated tub) Equipment Used: Gait belt Transfers/Ambulation Related to ADLs: Pt overall modified independent for mobility without use of assistive device.  Moves slightly slower secondary to not having her shoes on and just the gripper socks per her report. ADL Comments: Pt overall independent to modified independent for selfcare tasks and functional transfers.  Has handicapped apartment with grab bars at toilet and in the tub.  Pt with no symptoms of dizziness or syncope during session.  Feel she is at her functional baseline for selfcare tasks.         Visit Information  Last OT Received On: 01/30/13 Assistance Needed: +1    Subjective Data  Subjective: I just want to get out of here.   Prior Functioning     Home Living Lives With: Other (Comment) [facility]) Available Help at Discharge: Other (Comment) (has a friend but nobody consistently avaiable) Type of Home: Apartment Home Access: Elevator Home Layout: One level Bathroom Shower/Tub: Engineer, manufacturing systems: Standard Home Adaptive Equipment: Grab bars in shower;Straight cane Additional Comments: occasionally used cane but not often Prior Function Level of Independence: Independent Able to Take Stairs?: No Driving: No Vocation: Retired Musician: No difficulties Dominant Hand: Left         Vision/Perception Vision - History Baseline Vision: Wears glasses only for reading Patient Visual Report: No change from baseline Vision - Assessment Eye Alignment: Within Functional Limits Vision Assessment: Vision not tested Perception Perception: Within Functional Limits Praxis Praxis: Intact   Cognition  Cognition Arousal/Alertness: Awake/alert Behavior During Therapy: WFL for tasks assessed/performed Overall Cognitive Status: Within Functional Limits for tasks assessed     Extremity/Trunk Assessment Right Upper Extremity Assessment RUE ROM/Strength/Tone: Within functional levels RUE Sensation: WFL - Light  Touch RUE Coordination: WFL - gross/fine motor Left Upper Extremity Assessment LUE ROM/Strength/Tone: Within functional levels LUE Sensation: WFL - Light Touch LUE Coordination: WFL - gross/fine motor Trunk Assessment Trunk Assessment: Normal     Mobility Bed Mobility Bed Mobility: Supine to Sit Supine to Sit: 6: Modified independent (Device/Increase time);HOB elevated Transfers Transfers: Sit to Stand;Stand to Sit Sit to Stand: 6: Modified independent (Device/Increase time);With upper extremity assist;From toilet Stand to Sit: 6: Modified independent (Device/Increase time);With upper extremity assist;To toilet        Balance Balance Balance Assessed: Yes Static Standing Balance Static Standing - Balance Support: No upper extremity supported Static Standing - Level of Assistance: 7: Independent Dynamic Standing Balance Dynamic Standing - Balance Support: No upper extremity supported Dynamic Standing - Level of Assistance: 6: Modified independent (Device/Increase time) High Level Balance High Level Balance Activites: Backward walking;Sudden stops;Head turns High Level Balance Comments: Pt able to retrieve items from the floor without difficulty.    End of Session OT - End of Session Equipment Utilized During Treatment: Gait belt Activity Tolerance: Patient tolerated treatment well Patient left: in bed;with call bell/phone within reach;with family/visitor present Nurse Communication: Mobility status  GO Functional Assessment Tool Used: clinical judgement Functional Limitation: Self care Self Care Current Status (W1191): 0 percent impaired, limited or restricted Self Care Goal Status (Y7829): 0 percent impaired, limited or restricted Self Care Discharge Status (F6213): 0 percent impaired, limited or restricted   Chenoa Luddy OTR/L Pager  number [phone] 01/30/2013, 2:42 PM

## 2013-01-30 NOTE — Progress Notes (Signed)
Subjective: Pt denies lightheadedness or dizziness.  She denies chest pain or sob. She is feeling better and ambulated in the hallway with OT without any issues.  She is hungry and ready to go home.  Objective: Vital signs in last 24 hours: Filed Vitals:   01/30/13 0501 01/30/13 0503 01/30/13 0506 01/30/13 1433  BP: 124/82 125/79 134/77   Pulse: 68 86 80 66  Temp:      TempSrc:      Resp:      Height:      Weight:      SpO2:    99%   Weight change:   Intake/Output Summary (Last 24 hours) at 01/30/13 1659 Last data filed at 01/30/13 1218  Gross per 24 hour  Intake    360 ml  Output    500 ml  Net   -140 ml   Vitals reviewed. General: resting in bed, NAD, alert and oriented x 3  HEENT: [place]/at, no scleral icterus Cardiac: RRR, no rubs, murmurs or gallops Pulm: clear to auscultation bilaterally, no wheezes, rales, or rhonchi Abd: soft, nontender, nondistended, BS present Ext: warm and well perfused, no pedal edema Neuro: alert and oriented X3, grossly neurologically intact moving all 4 extremities   Lab Results: Basic Metabolic Panel:  Recent Labs Lab 01/29/13 1714 01/30/13 0500  NA 140 138  K 4.1 4.0  CL 102 106  CO2 25 22  GLUCOSE 92 92  BUN 23 19  CREATININE 0.79 0.66  CALCIUM 9.7 8.9   Liver Function Tests:  Recent Labs Lab 01/29/13 1714 01/30/13 0500  AST 19 17  ALT 7 7  ALKPHOS 84 72  BILITOT 0.3 0.3  PROT 7.2 6.3  ALBUMIN 3.5 3.1*   CBC:  Recent Labs Lab 01/29/13 1714 01/30/13 0500  WBC 5.0 3.5*  NEUTROABS 3.7  --   HGB 13.7 12.3  HCT 40.2 35.7*  MCV 83.4 82.8  PLT 153 153   Cardiac Enzymes:  Recent Labs Lab 01/29/13 2116 01/30/13 0519 01/30/13 0800  TROPONINI <0.30 <0.30 <0.30   Coagulation:  Recent Labs Lab 01/29/13 1714  LABPROT 12.7  INR 0.96   Urinalysis:  Recent Labs Lab 01/30/13 0322  COLORURINE YELLOW  LABSPEC 1.027  PHURINE 6.0  GLUCOSEU NEGATIVE  HGBUR NEGATIVE  BILIRUBINUR SMALL*  KETONESUR NEGATIVE   PROTEINUR NEGATIVE  UROBILINOGEN 1.0  NITRITE NEGATIVE  LEUKOCYTESUR NEGATIVE   Misc. Labs: None   Micro Results: No results found for this or any previous visit (from the past 240 hour(s)). Studies/Results: Dg Chest 2 View  01/29/2013   *RADIOLOGY REPORT*  Clinical Data: Near-syncope.  CHEST - 2 VIEW  Comparison: 10/09/2012.  Findings: The cardiac silhouette, mediastinal and hilar contours are within normal limits and stable.  There is tortuosity of the thoracic aorta.  Stable apical scarring changes on the right.  No acute pulmonary findings.  No pleural effusion.  The bony thorax is intact.  IMPRESSION: No acute cardiopulmonary findings.   Original Report Authenticated By: Rudie Meyer, M.D.   Ct Head Wo Contrast  01/29/2013   *RADIOLOGY REPORT*  Clinical Data: Near-syncope  CT HEAD WITHOUT CONTRAST  Technique:  Contiguous axial images were obtained from the base of the skull through the vertex without contrast.  Comparison: None.  Findings: No evidence of parenchymal hemorrhage or extra-axial fluid collection. No mass lesion, mass effect, or midline shift.  No CT evidence of acute infarction.  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  Extensive small vessel ischemic changes.  Mild intracranial atherosclerosis.  The visualized paranasal sinuses are essentially clear. The mastoid air cells are unopacified.  No evidence of calvarial fracture.  IMPRESSION: No evidence of acute intracranial abnormality.  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  Atrophy with extensive small vessel ischemic changes.   Original Report Authenticated By: Charline Bills, M.D.   Medications:  Scheduled Meds: . aspirin EC  81 mg Oral Daily  . atorvastatin  20 mg Oral q1800  . enoxaparin (LOVENOX) injection  40 mg Subcutaneous Q24H  . sodium chloride  3 mL Intravenous Q12H   Continuous Infusions:  PRN Meds:.sodium chloride, sodium chloride Assessment/Plan: Principal Problem:   Syncope Active Problems:    Solitary pulmonary nodule   Hypertension   Sinus bradycardia   History of CVA (cerebrovascular accident)   First degree heart block   PAC (premature atrial contraction)  1. Syncope  -Etiology unclear, could be secondary to arrhythmia as patient had ekg with 1st degree AV block, blocked PACs, short burst of atrial tachycardia vs atrial flutter 3:1 block.  Orthostatics negative. -will monitor via telemetry -Dr. Mayford Knife with cardiology consulted and following. She has pending 2D echo and will be NPO for nuclear stress test in the am.  She will need lifewatch monitor for 4 weeks for arrhythmias  2. First degree heart block and asymptomatic bradycardia  - unknown baseline.  - Avoid AV nodal blocking agents.  - cardiology consulted and made recommendations.  See #1  3. Hypertension history with hypotension  - hypotension noted on EMS arrival (per nursing note).  Holding antihypertensives (i.e Cozaar 50 mg qd) -Monitor VS.  BP is trending back up 131/80.    4. Apparent history of basal ganglia infarcts - CT head shows remote left BG lacunar infarcts and extensive microvascular ischemic changes -aspirin 81 mg qd, Lipitor 20 mg qhs  (recent LDL of 168) -PT/OT consulted   5. History of RUL solitary lung nodule  - has been evaluated by Dr. Sherene Sires as recently as 10/2012 with decision to defer invasive workup unless large health issues arise. Pulmonary status stable per pt.  - Continue followup with Dr. Sherene Sires  6. Tobacco abuse -smoking cessation   7. F/E/N -cardiac diet then NPO s/p MN for nuclear stress test   8. DVT Px -Lovenox    Dispo: Disposition is deferred at this time, awaiting improvement of current medical problems.  Anticipated discharge in approximately 1 day(s).   The patient does have a current PCP Della Goo C, MD), therefore will not be requiring OPC follow-up after discharge.   Unknown if the patient has transportation limitations that hinder transportation to  clinic appointments.  .Services Needed at time of discharge: Y = Yes, Blank = No PT: None   OT:   RN:   Equipment:   Other:     LOS: 1 day   Annett Gula [phone] 01/30/2013, 4:59 PM

## 2013-01-30 NOTE — Consult Note (Signed)
CARDIOLOGY CONSULT NOTE   Kendra Nixon MRN: [id number] DOB/AGE: 74-Dec-1942 74 y.o. Admit date: 01/29/2013  Referring Physician:  Dr. Marlana Salvage- Thad Ranger  Primary Cardiologist: unassigned  Reason for consultation:  Syncope, bradycardia  HPI:  Pt is a 72 yo woman prior smoker with HTN, CVA, HLD who presents with syncope.  She was at a BBQ today and went to sit down in between 2 people with her food.  After sitting, she doesn't remember anything but waking up and the ambulance was there.  On waking, she did feel as if she had some vomit that wanted to come up, but she did not actually throw up.  She felt normal today and ate breakfast and lunch.  She was not standing for prolonged periods today.  She was told by the people sitting next to her that she was out for about 2 mins and she was sweaty.  She denies any chest pain, SOB, PND, orthopnea, palpitations.  She felt normal when she came to.  She states that for the past month she has had an episode of lightheadedness about once per week.  She usually just lays in bed and it improves.  She has never had syncope prior to today.  She denies any cardiac hx.    Review of systems: A review of 10 organ systems was done and is negative except as stated above in HPI  Past Medical History  Diagnosis Date  . Hypertension   . ACE-inhibitor cough   . History of CVA (cerebrovascular accident)     Old left basal ganglion lacunar infarct noted per CT head (01/2013)  . Solitary lung nodule     4 mm right upper lobe nodule  . Hyperlipidemia LDL goal < 100    Past Surgical History  Procedure Laterality Date  . Breast lumpectomy Bilateral age 60   History   Social History  . Marital Status: Single    Spouse Name: N/A    Number of Children: 3  . Years of Education: 11th grade   Occupational History  . 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 Smoking status: Former Smoker -- 0.25 packs/day  for 30 years    Types: Cigarettes    Quit date: 10/27/2012  . Smokeless tobacco: Never Used  . Alcohol Use: No  . Drug Use: No  . Sexually Active: Not on file   Other Topics Concern  . Not on file   Social History Narrative   Lives in [place] alone in an independent living center. Is able to perform all ADLS and IADLs independently.    Family History  Problem Relation Age of Onset  . Breast cancer Sister 49  . Hypertension    . Alzheimer's disease Maternal Grandmother   . Alzheimer's disease Maternal Aunt      No Known Allergies  Prescriptions prior to admission  Medication Sig Dispense Refill  . losartan (COZAAR) 50 MG tablet Take 50 mg by mouth daily.        Current facility-administered medications:0.9 %  sodium chloride infusion, 250 mL, Intravenous, PRN, Maitri S Kalia-Reynolds, DO;  aspirin EC tablet 81 mg, 81 mg, Oral, Daily, Maitri S Kalia-Reynolds, DO;  atorvastatin (LIPITOR) tablet 20 mg, 20 mg, Oral, q1800, Maitri S Kalia-Reynolds, DO, 20 mg at 01/30/13 0025;  enoxaparin (LOVENOX) injection 40 mg, 40 mg, Subcutaneous, Q24H, Maitri S Kalia-Reynolds, DO sodium chloride 0.9 % injection 3 mL,  3 mL, Intravenous, Q12H, Maitri S Kalia-Reynolds, DO, 3 mL at 01/29/13 2349;  sodium chloride 0.9 % injection 3 mL, 3 mL, Intravenous, PRN, Priscella Mann, DO  Physical Exam: Blood pressure 139/78, pulse 56, temperature 98.3 F (36.8 C), temperature source Oral, resp. rate 18, height 5\' 6"  (1.676 m), weight 52 kg (114 lb 10.2 oz), SpO2 98.00%.; Body mass index is 18.51 kg/(m^2). Temp:  [97.9 F (36.6 C)-98.3 F (36.8 C)] 98.3 F (36.8 C) (05/26 2334) Pulse Rate:  [55-81] 56 (05/26 2334) Resp:  [13-21] 18 (05/26 2334) BP: (134-180)/(74-103) 139/78 mmHg (05/26 2334) SpO2:  [96 %-100 %] 98 % (05/26 2334) Weight:  [52 kg (114 lb 10.2 oz)] 52 kg (114 lb 10.2 oz) (05/26 2200)   Intake/Output Summary (Last 24 hours) at 01/30/13 0258 Last data filed at 01/29/13 2238   Gross per 24 hour  Intake      0 ml  Output    300 ml  Net   -300 ml   General: NAD Heent: MMM Neck: No JVD  CV:RRR, no m Lungs: Clear to auscultation bilaterally with normal respiratory effort Abdomen: Soft, nontender, nondistended Extremities: No clubbing or cyanosis.  No pedal edema Skin: Intact without lesions or rashes  Neurologic: Alert and oriented x 3, grossly nonfocal  Psych: Normal mood and affect    Labs:  Recent Labs  01/29/13 2116  TROPONINI <0.30   Lab Results  Component Value Date   WBC 5.0 01/29/2013   HGB 13.7 01/29/2013   HCT 40.2 01/29/2013   MCV 83.4 01/29/2013   PLT 153 01/29/2013    Recent Labs Lab 01/29/13 1714  NA 140  K 4.1  CL 102  CO2 25  BUN 23  CREATININE 0.79  CALCIUM 9.7  PROT 7.2  BILITOT 0.3  ALKPHOS 84  ALT 7  AST 19  GLUCOSE 92   Lab Results  Component Value Date   CHOL 268* 10/30/2012   HDL 68 10/30/2012   LDLCALC 189* 10/30/2012   TRIG 53 10/30/2012       EKG:  Sinus, prolonged PR, wenckebach, narrow QRS Tele at 3 am with sinus, rate 59 Tele prior to that with wenckebach  Radiology:  Dg Chest 2 View  01/29/2013   *RADIOLOGY REPORT*  Clinical Data: Near-syncope.  CHEST - 2 VIEW  Comparison: 10/09/2012.  Findings: The cardiac silhouette, mediastinal and hilar contours are within normal limits and stable.  There is tortuosity of the thoracic aorta.  Stable apical scarring changes on the right.  No acute pulmonary findings.  No pleural effusion.  The bony thorax is intact.  IMPRESSION: No acute cardiopulmonary findings.   Original Report Authenticated By: Rudie Meyer, M.D.   Ct Head Wo Contrast  01/29/2013   *RADIOLOGY REPORT*  Clinical Data: Near-syncope  CT HEAD WITHOUT CONTRAST  Technique:  Contiguous axial images were obtained from the base of the skull through the vertex without contrast.  Comparison: None.  Findings: No evidence of parenchymal hemorrhage or extra-axial fluid collection. No mass lesion, mass effect, or  midline shift.  No CT evidence of acute infarction.  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  Extensive small vessel ischemic changes.  Mild intracranial atherosclerosis.  The visualized paranasal sinuses are essentially clear. The mastoid air cells are unopacified.  No evidence of calvarial fracture.  IMPRESSION: No evidence of acute intracranial abnormality.  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  Atrophy with extensive small vessel ischemic changes.   Original Report Authenticated By: Charline Bills,  M.D.    ASSESSMENT:  Pt is a 72 yo woman prior smoker with HTN, CVA, HLD who presents with syncope.  EKG shows wenckebach, current tele with sinus brady.  RECOMMENDATIONS:  - check echo in am - trend enzymes - monitor on tele - daily EKGs - avoid AV nodal blockers - if continues to have wenckebach and no other arrhythmia, no acute indication for pacemaker - pt has had some recent episodes of lightheadedness prior to this and so she should be sent out with an extended cardiac monitor  Thank you for this consultation.  Recs relayed to primary team.  Will continue to follow.  Signed: Hilary Hertz, MD Cardiology Fellow 01/30/2013, 2:58 AM

## 2013-01-30 NOTE — Progress Notes (Signed)
PT Cancellation Note  Patient Details Name: Kendra Nixon MRN: [id number] DOB: 07-23-1941   Cancelled Treatment:    Reason Eval/Treat Not Completed: Other (comment) had noted order date to start tomorrow.  Spoke with resident who wanted to see pt prior to being seen for therapy today.  Noted OT note with pt able to mobilize independently.  Spoke with OT who agrees okay to defer PT eval due to pt independent.  Please re-order PT if acute needs identified.  Thanks   Shirlie Enck,CYNDI 01/30/2013, 3:11 PM

## 2013-01-30 NOTE — Progress Notes (Addendum)
SUBJECTIVE:  No further syncope - tele reviewed and no evidence of wenkebach block only blocked PAC's and one episode of what appears to be atrial tachcyardia with 3:1 block  OBJECTIVE:   Vitals:   Filed Vitals:   01/30/13 0500 01/30/13 0501 01/30/13 0503 01/30/13 0506  BP: 131/80 124/82 125/79 134/77  Pulse: 70 68 86 80  Temp: 98.3 F (36.8 C)     TempSrc: Oral     Resp: 18     Height:      Weight: 52 kg (114 lb 10.2 oz)     SpO2: 100%      I&O's:   Intake/Output Summary (Last 24 hours) at 01/30/13 4098 Last data filed at 01/29/13 2238  Gross per 24 hour  Intake      0 ml  Output    300 ml  Net   -300 ml   TELEMETRY: Reviewed telemetry pt in NSR with blocked PAC's and one episode of brief atrial tachcyardia with 3:1 block    PHYSICAL EXAM General: Well developed, well nourished, in no acute distress Head: Eyes PERRLA, No xanthomas.   Normal cephalic and atramatic  Lungs:   Clear bilaterally to auscultation and percussion. Heart:   HRRR S1 S2 Pulses are 2+ & equal. Abdomen: Bowel sounds are positive, abdomen soft and non-tender without masses  Extremities:   No clubbing, cyanosis or edema.  DP +1 Neuro: Alert and oriented X 3. Psych:  Good affect, responds appropriately   LABS: Basic Metabolic Panel:  Recent Labs  11/91/47 1714 01/30/13 0500  NA 140 138  K 4.1 4.0  CL 102 106  CO2 25 22  GLUCOSE 92 92  BUN 23 19  CREATININE 0.79 0.66  CALCIUM 9.7 8.9   Liver Function Tests:  Recent Labs  01/29/13 1714 01/30/13 0500  AST 19 17  ALT 7 7  ALKPHOS 84 72  BILITOT 0.3 0.3  PROT 7.2 6.3  ALBUMIN 3.5 3.1*   No results found for this basename: LIPASE, AMYLASE,  in the last 72 hours CBC:  Recent Labs  01/29/13 1714 01/30/13 0500  WBC 5.0 3.5*  NEUTROABS 3.7  --   HGB 13.7 12.3  HCT 40.2 35.7*  MCV 83.4 82.8  PLT 153 153   Cardiac Enzymes:  Recent Labs  01/29/13 2116 01/30/13 0519 01/30/13 0800  TROPONINI <0.30 <0.30 <0.30   Coag Panel:    Lab Results  Component Value Date   INR 0.96 01/29/2013    RADIOLOGY: Dg Chest 2 View  01/29/2013   *RADIOLOGY REPORT*  Clinical Data: Near-syncope.  CHEST - 2 VIEW  Comparison: 10/09/2012.  Findings: The cardiac silhouette, mediastinal and hilar contours are within normal limits and stable.  There is tortuosity of the thoracic aorta.  Stable apical scarring changes on the right.  No acute pulmonary findings.  No pleural effusion.  The bony thorax is intact.  IMPRESSION: No acute cardiopulmonary findings.   Original Report Authenticated By: Rudie Meyer, M.D.   Ct Head Wo Contrast  01/29/2013   *RADIOLOGY REPORT*  Clinical Data: Near-syncope  CT HEAD WITHOUT CONTRAST  Technique:  Contiguous axial images were obtained from the base of the skull through the vertex without contrast.  Comparison: None.  Findings: No evidence of parenchymal hemorrhage or extra-axial fluid collection. No mass lesion, mass effect, or midline shift.  No CT evidence of acute infarction.  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  Extensive small vessel ischemic changes.  Mild intracranial atherosclerosis.  The visualized  paranasal sinuses are essentially clear. The mastoid air cells are unopacified.  No evidence of calvarial fracture.  IMPRESSION: No evidence of acute intracranial abnormality.  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  Atrophy with extensive small vessel ischemic changes.   Original Report Authenticated By: Charline Bills, M.D.      ASSESSMENT:  1.  Syncope ? Etiology - heart monitor has shown blocked PAC's and no AV block.  There was one short burst of atrial tachycardia vs. Atrial flutter with 3:1 block 2.  History of CVA 3.  HTN  PLAN:   1.  Check 2D echo to assess LVF 2.  NPO after midnight for nuclear stress test 3.  Will need outpt Lifewatch monitor for 4 weeks to assess for arrhythmias  Quintella Reichert, MD  01/30/2013  9:39 AM

## 2013-01-30 NOTE — H&P (Signed)
Internal Medicine Teaching Service Attending Note Date: 01/30/2013  Patient name: Kendra Nixon  Medical record number: [id number]  Date of birth: [birth year]   I have seen and evaluated Kendra Nixon and discussed their care with the Residency Team. Kendra Nixon was admitted for a syncope which is a new problem for her. She was in her usual state of health and was at a cook out. She had on a sweater as it was a little cool. She went upstairs (elevator) to her appt and came back down and sat down and "passed out." She did not fall out of her chair but slumped onto the lady beside her. She states she then came to and they said she was out for couple of min and was sweating. Afterwards, she felt nauseated and wanted to throw up bit didn't. She has had no CP, SOB, weakness, HA. She wants to return home.  Physical Exam: Blood pressure 134/77, pulse 80, temperature 98.3 F (36.8 C), temperature source Oral, resp. rate 18, height 5\' 6"  (1.676 m), weight 114 lb 10.2 oz (52 kg), SpO2 100.00%. General appearance: alert, cooperative, appears stated age and no distress Head: Normocephalic, without obvious abnormality, atraumatic Eyes: negative Lungs: clear to auscultation bilaterally Heart: regular rate and rhythm, S1, S2 normal, no murmur, click, rub or gallop and no rub Abdomen: soft, non-tender; bowel sounds normal; no masses,  no organomegaly Extremities: extremities normal, atraumatic, no cyanosis or edema Pulses: 2+ and symmetric Skin: Skin color, texture, turgor normal. No rashes or lesions Neurologic: Grossly normal  Lab results: Results for orders placed during the hospital encounter of 01/29/13 (from the past 24 hour(s))  CBC WITH DIFFERENTIAL     Status: None   Collection Time    01/29/13  5:14 PM      Result Value Range   WBC 5.0  4.0 - 10.5 K/uL   RBC 4.82  3.87 - 5.11 MIL/uL   Hemoglobin 13.7  12.0 - 15.0 g/dL   HCT 08.6  57.8 - 46.9 %   MCV 83.4  78.0 - 100.0 fL   MCH 28.4  26.0 - 34.0  pg   MCHC 34.1  30.0 - 36.0 g/dL   RDW 62.9  52.8 - 41.3 %   Platelets 153  150 - 400 K/uL   Neutrophils Relative % 74  43 - 77 %   Neutro Abs 3.7  1.7 - 7.7 K/uL   Lymphocytes Relative 18  12 - 46 %   Lymphs Abs 0.9  0.7 - 4.0 K/uL   Monocytes Relative 6  3 - 12 %   Monocytes Absolute 0.3  0.1 - 1.0 K/uL   Eosinophils Relative 2  0 - 5 %   Eosinophils Absolute 0.1  0.0 - 0.7 K/uL   Basophils Relative 0  0 - 1 %   Basophils Absolute 0.0  0.0 - 0.1 K/uL  COMPREHENSIVE METABOLIC PANEL     Status: Abnormal   Collection Time    01/29/13  5:14 PM      Result Value Range   Sodium 140  135 - 145 mEq/L   Potassium 4.1  3.5 - 5.1 mEq/L   Chloride 102  96 - 112 mEq/L   CO2 25  19 - 32 mEq/L   Glucose, Bld 92  70 - 99 mg/dL   BUN 23  6 - 23 mg/dL   Creatinine, Ser 2.44  0.50 - 1.10 mg/dL   Calcium 9.7  8.4 - 01.0 mg/dL   Total Protein  7.2  6.0 - 8.3 g/dL   Albumin 3.5  3.5 - 5.2 g/dL   AST 19  0 - 37 U/L   ALT 7  0 - 35 U/L   Alkaline Phosphatase 84  39 - 117 U/L   Total Bilirubin 0.3  0.3 - 1.2 mg/dL   GFR calc non Af Amer 80 (*) >90 mL/min   GFR calc Af Amer >90  >90 mL/min  PROTIME-INR     Status: None   Collection Time    01/29/13  5:14 PM      Result Value Range   Prothrombin Time 12.7  11.6 - 15.2 seconds   INR 0.96  0.00 - 1.49  POCT I-STAT TROPONIN I     Status: None   Collection Time    01/29/13  5:37 PM      Result Value Range   Troponin i, poc 0.00  0.00 - 0.08 ng/mL   Comment 3           TROPONIN I     Status: None   Collection Time    01/29/13  9:16 PM      Result Value Range   Troponin I <0.30  <0.30 ng/mL  URINALYSIS, ROUTINE W REFLEX MICROSCOPIC     Status: Abnormal   Collection Time    01/30/13  3:22 AM      Result Value Range   Color, Urine YELLOW  YELLOW   APPearance CLEAR  CLEAR   Specific Gravity, Urine 1.027  1.005 - 1.030   pH 6.0  5.0 - 8.0   Glucose, UA NEGATIVE  NEGATIVE mg/dL   Hgb urine dipstick NEGATIVE  NEGATIVE   Bilirubin Urine SMALL  (*) NEGATIVE   Ketones, ur NEGATIVE  NEGATIVE mg/dL   Protein, ur NEGATIVE  NEGATIVE mg/dL   Urobilinogen, UA 1.0  0.0 - 1.0 mg/dL   Nitrite NEGATIVE  NEGATIVE   Leukocytes, UA NEGATIVE  NEGATIVE  COMPREHENSIVE METABOLIC PANEL     Status: Abnormal   Collection Time    01/30/13  5:00 AM      Result Value Range   Sodium 138  135 - 145 mEq/L   Potassium 4.0  3.5 - 5.1 mEq/L   Chloride 106  96 - 112 mEq/L   CO2 22  19 - 32 mEq/L   Glucose, Bld 92  70 - 99 mg/dL   BUN 19  6 - 23 mg/dL   Creatinine, Ser 4.09  0.50 - 1.10 mg/dL   Calcium 8.9  8.4 - 81.1 mg/dL   Total Protein 6.3  6.0 - 8.3 g/dL   Albumin 3.1 (*) 3.5 - 5.2 g/dL   AST 17  0 - 37 U/L   ALT 7  0 - 35 U/L   Alkaline Phosphatase 72  39 - 117 U/L   Total Bilirubin 0.3  0.3 - 1.2 mg/dL   GFR calc non Af Amer 85 (*) >90 mL/min   GFR calc Af Amer >90  >90 mL/min  CBC     Status: Abnormal   Collection Time    01/30/13  5:00 AM      Result Value Range   WBC 3.5 (*) 4.0 - 10.5 K/uL   RBC 4.31  3.87 - 5.11 MIL/uL   Hemoglobin 12.3  12.0 - 15.0 g/dL   HCT 91.4 (*) 78.2 - 95.6 %   MCV 82.8  78.0 - 100.0 fL   MCH 28.5  26.0 - 34.0 pg   MCHC 34.5  30.0 - 36.0 g/dL   RDW 09.8  11.9 - 14.7 %   Platelets 153  150 - 400 K/uL  TROPONIN I     Status: None   Collection Time    01/30/13  5:19 AM      Result Value Range   Troponin I <0.30  <0.30 ng/mL  TROPONIN I     Status: None   Collection Time    01/30/13  8:00 AM      Result Value Range   Troponin I <0.30  <0.30 ng/mL    Imaging results:  Dg Chest 2 View  01/29/2013   *RADIOLOGY REPORT*  Clinical Data: Near-syncope.  CHEST - 2 VIEW  Comparison: 10/09/2012.  Findings: The cardiac silhouette, mediastinal and hilar contours are within normal limits and stable.  There is tortuosity of the thoracic aorta.  Stable apical scarring changes on the right.  No acute pulmonary findings.  No pleural effusion.  The bony thorax is intact.  IMPRESSION: No acute cardiopulmonary findings.    Original Report Authenticated By: Rudie Meyer, M.D.   Ct Head Wo Contrast  01/29/2013   *RADIOLOGY REPORT*  Clinical Data: Near-syncope  CT HEAD WITHOUT CONTRAST  Technique:  Contiguous axial images were obtained from the base of the skull through the vertex without contrast.  Comparison: None.  Findings: No evidence of parenchymal hemorrhage or extra-axial fluid collection. No mass lesion, mass effect, or midline shift.  No CT evidence of acute infarction.  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  Extensive small vessel ischemic changes.  Mild intracranial atherosclerosis.  The visualized paranasal sinuses are essentially clear. The mastoid air cells are unopacified.  No evidence of calvarial fracture.  IMPRESSION: No evidence of acute intracranial abnormality.  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  Atrophy with extensive small vessel ischemic changes.   Original Report Authenticated By: Charline Bills, M.D.    Assessment and Plan: I agree with the formulated Assessment and Plan with the following changes:   1. Syncope - This is a new problem. Likely cause is arrythmia and Dr Mayford Knife is planning for stress test in AM, ECHO has been ordered, and an outpt monitor. She had transient Type I Wenkebach on admit. She has R/O for AMI with Trop I. Her Ct showed no acute changes but an old basal ganglia. There were no electrolyte abnl. Will await cardiac W/U.  2. Remote CVA - this was only seen on CT. Agree with ASA. Agree with statin since LDL was 189 in Feb 2014. Goal would be < 100 if can achieve without sig side effects.   3. Fall risk - she has had no falls but recently started an exercise program at her ILF which will be very helpful.  Likely D/C tomorrow is cards W/U OK.  Burns Spain, MD 5/27/20142:22 PM

## 2013-01-31 ENCOUNTER — Observation Stay (HOSPITAL_COMMUNITY): Payer: Medicare Other

## 2013-01-31 LAB — CBC
HCT: 35.5 % — ABNORMAL LOW (ref 36.0–46.0)
Hemoglobin: 12.3 g/dL (ref 12.0–15.0)
RBC: 4.27 MIL/uL (ref 3.87–5.11)
RDW: 14.4 % (ref 11.5–15.5)
WBC: 3.1 10*3/uL — ABNORMAL LOW (ref 4.0–10.5)

## 2013-01-31 MED ORDER — REGADENOSON 0.4 MG/5ML IV SOLN
INTRAVENOUS | Status: AC
  Filled 2013-01-31: qty 5

## 2013-01-31 MED ORDER — TECHNETIUM TC 99M SESTAMIBI GENERIC - CARDIOLITE
10.0000 | Freq: Once | INTRAVENOUS | Status: AC | PRN
Start: 2013-01-31 — End: 2013-01-31
  Administered 2013-01-31: 10 via INTRAVENOUS

## 2013-01-31 MED ORDER — TECHNETIUM TC 99M SESTAMIBI GENERIC - CARDIOLITE
30.0000 | Freq: Once | INTRAVENOUS | Status: AC | PRN
Start: 2013-01-31 — End: 2013-01-31
  Administered 2013-01-31: 30 via INTRAVENOUS

## 2013-01-31 MED ORDER — ASPIRIN 81 MG PO TBEC: 81.0000 mg | DELAYED_RELEASE_TABLET | Freq: Every day | ORAL | Status: DC

## 2013-01-31 MED ORDER — LOVASTATIN 40 MG PO TABS: 40.0000 mg | ORAL_TABLET | Freq: Every day | ORAL | Status: DC

## 2013-01-31 MED ORDER — REGADENOSON 0.4 MG/5ML IV SOLN
0.4000 mg | Freq: Once | INTRAVENOUS | Status: AC
  Administered 2013-01-31: 0.4 mg via INTRAVENOUS
  Filled 2013-01-31: qty 5

## 2013-01-31 NOTE — Progress Notes (Signed)
Physical Therapy Evaluation Patient Details Name: Kendra Nixon MRN: [id number] DOB: 10-02-1940 Today's Date: 01/31/2013 Time: 1211-1225 PT Time Calculation (min): 14 min  PT Assessment / Plan / Recommendation Clinical Impression  Pt s/p syncope at baseline status.  No PT needs.  Will sign off.     PT Assessment  Patent does not need any further PT services    Follow Up Recommendations  No PT follow up                Equipment Recommendations  None recommended by PT               Precautions / Restrictions Precautions Precautions: None Restrictions Weight Bearing Restrictions: No   Pertinent Vitals/Pain Vss, no pain      Mobility  Bed Mobility Bed Mobility: Supine to Sit Supine to Sit: 6: Modified independent (Device/Increase time);HOB elevated Transfers Sit to Stand: 6: Modified independent (Device/Increase time);With upper extremity assist;From toilet Stand to Sit: 6: Modified independent (Device/Increase time);With upper extremity assist;To toilet Ambulation/Gait Ambulation/Gait Assistance: 7: Independent Ambulation Distance (Feet): 450 Feet Assistive device: None Ambulation/Gait Assistance Details: Pt occasionally veers left and right but self corrects.  Pt states she is at baseline.   Gait Pattern: Step-through pattern;Decreased stride length;Wide base of support Gait velocity: decreased Stairs: No Wheelchair Mobility Wheelchair Mobility: No     PT Goals  N/A  Visit Information  Last PT Received On: 01/31/13 Assistance Needed: +1    Subjective Data  Subjective: "I feel so good.  I will be fine at home." Patient Stated Goal: Go home today.   Prior Functioning  Home Living Lives With: Other (Comment) [facility]) Available Help at Discharge: Other (Comment) (has a friend but nobody consistently avaiable) Type of Home: Apartment Home Access: Elevator Home Layout: One level Bathroom Shower/Tub: Engineer, manufacturing systems:  Standard Home Adaptive Equipment: Grab bars in shower;Straight cane Additional Comments: occasionally used cane but not often Prior Function Level of Independence: Independent Able to Take Stairs?: No Driving: No Vocation: Retired Musician: No difficulties Dominant Hand: Left    Cognition  Cognition Arousal/Alertness: Awake/alert Behavior During Therapy: WFL for tasks assessed/performed Overall Cognitive Status: Within Functional Limits for tasks assessed    Extremity/Trunk Assessment Right Lower Extremity Assessment RLE ROM/Strength/Tone: WFL for tasks assessed Left Lower Extremity Assessment LLE ROM/Strength/Tone: WFL for tasks assessed Trunk Assessment Trunk Assessment: Normal   Balance Static Standing Balance Static Standing - Balance Support: No upper extremity supported Static Standing - Level of Assistance: 7: Independent Dynamic Standing Balance Dynamic Standing - Balance Support: No upper extremity supported Dynamic Standing - Level of Assistance: 7: Independent Standardized Balance Assessment Standardized Balance Assessment: Dynamic Gait Index Dynamic Gait Index Level Surface: Normal Change in Gait Speed: Normal Gait with Horizontal Head Turns: Normal Gait with Vertical Head Turns: Normal Gait and Pivot Turn: Normal Step Over Obstacle: Mild Impairment Step Around Obstacles: Mild Impairment Steps: Moderate Impairment Total Score: 20 High Level Balance High Level Balance Activites: Backward walking;Sudden stops;Head turns High Level Balance Comments: Pt able to retrieve items from the floor without difficulty.   End of Session PT - End of Session Equipment Utilized During Treatment: Gait belt Activity Tolerance: Patient tolerated treatment well Patient left: in bed;with call bell/phone within reach Nurse Communication: Mobility status  GP Functional Assessment Tool Used: clinical judgment Functional Limitation: Mobility: Walking and  moving around Mobility: Walking and Moving Around Current Status (F6213): 0 percent impaired, limited or restricted Mobility: Walking and Moving Around Goal  Status [phone]): 0 percent impaired, limited or restricted Mobility: Walking and Moving Around Discharge Status [phone]): 0 percent impaired, limited or restricted   Kendra Nixon,Kendra Nixon 01/31/2013, 12:47 PM [facility] [phone] (pager)

## 2013-01-31 NOTE — Progress Notes (Signed)
  I discussed with Dr. Eldridge Dace who reviewed patient's myoview and 2D echo. Dr. Eldridge Dace indicated that patient can be discharged home tonight from cardiology standpoint and follow up with [facility] cardiology for the lifewatch monitor as outpatient. Dr. Eldridge Dace agrees with the discharge plan to start ASA and Statin, and continue home ARB. Patient will follow up with her PCP as well.   Of note, I have personally reviewed patient's rhythm monitor since her admission and No arrhythmia or significant pause (> 3 secs) are noted.

## 2013-01-31 NOTE — Progress Notes (Signed)
  Echocardiogram 2D Echocardiogram has been performed.  [name] 01/31/2013, 11:15 AM

## 2013-01-31 NOTE — Progress Notes (Signed)
Call report received from radiology regarding stress test results. Dr Eldridge Dace made aware.

## 2013-01-31 NOTE — Progress Notes (Addendum)
SUBJECTIVE:  No further syncope or lightheadedness.  No chest pain.  Did well with stress test today.  OBJECTIVE:   Vitals:   Filed Vitals:   01/31/13 0851 01/31/13 0903 01/31/13 0905 01/31/13 0907  BP: 168/78 185/104 152/81 150/80  Pulse: 55 69 87 83  Temp:      TempSrc:      Resp:      Height:      Weight:      SpO2:       I&O's:   Intake/Output Summary (Last 24 hours) at 01/31/13 1029 Last data filed at 01/30/13 1218  Gross per 24 hour  Intake    360 ml  Output      0 ml  Net    360 ml        PHYSICAL EXAM General: Well developed, well nourished, in no acute distress Head:  Normal cephalic and atramatic  Lungs:   Clear bilaterally to auscultation and percussion. Heart:   HRRR S1 S2  Abdomen:  abdomen soft and non-tender Msk:   Normal strength and tone for age. Extremities:   No  edema.   Neuro: Alert and oriented X 3. Psych:  Normal affect, responds appropriately   LABS: Basic Metabolic Panel:  Recent Labs  16/10/96 1714 01/30/13 0500  NA 140 138  K 4.1 4.0  CL 102 106  CO2 25 22  GLUCOSE 92 92  BUN 23 19  CREATININE 0.79 0.66  CALCIUM 9.7 8.9   Liver Function Tests:  Recent Labs  01/29/13 1714 01/30/13 0500  AST 19 17  ALT 7 7  ALKPHOS 84 72  BILITOT 0.3 0.3  PROT 7.2 6.3  ALBUMIN 3.5 3.1*   No results found for this basename: LIPASE, AMYLASE,  in the last 72 hours CBC:  Recent Labs  01/29/13 1714 01/30/13 0500 01/31/13 0505  WBC 5.0 3.5* 3.1*  NEUTROABS 3.7  --   --   HGB 13.7 12.3 12.3  HCT 40.2 35.7* 35.5*  MCV 83.4 82.8 83.1  PLT 153 153 156   Cardiac Enzymes:  Recent Labs  01/29/13 2116 01/30/13 0519 01/30/13 0800  TROPONINI <0.30 <0.30 <0.30   BNP: No components found with this basename: POCBNP,  D-Dimer: No results found for this basename: DDIMER,  in the last 72 hours Hemoglobin A1C: No results found for this basename: HGBA1C,  in the last 72 hours Fasting Lipid Panel: No results found for this basename:  CHOL, HDL, LDLCALC, TRIG, CHOLHDL, LDLDIRECT,  in the last 72 hours Thyroid Function Tests: No results found for this basename: TSH, T4TOTAL, FREET3, T3FREE, THYROIDAB,  in the last 72 hours Anemia Panel: No results found for this basename: VITAMINB12, FOLATE, FERRITIN, TIBC, IRON, RETICCTPCT,  in the last 72 hours Coag Panel:   Lab Results  Component Value Date   INR 0.96 01/29/2013    RADIOLOGY: Dg Chest 2 View  01/29/2013   *RADIOLOGY REPORT*  Clinical Data: Near-syncope.  CHEST - 2 VIEW  Comparison: 10/09/2012.  Findings: The cardiac silhouette, mediastinal and hilar contours are within normal limits and stable.  There is tortuosity of the thoracic aorta.  Stable apical scarring changes on the right.  No acute pulmonary findings.  No pleural effusion.  The bony thorax is intact.  IMPRESSION: No acute cardiopulmonary findings.   Original Report Authenticated By: Rudie Meyer, M.D.   Ct Head Wo Contrast  01/29/2013   *RADIOLOGY REPORT*  Clinical Data: Near-syncope  CT HEAD WITHOUT CONTRAST  Technique:  Contiguous  axial images were obtained from the base of the skull through the vertex without contrast.  Comparison: None.  Findings: No evidence of parenchymal hemorrhage or extra-axial fluid collection. No mass lesion, mass effect, or midline shift.  No CT evidence of acute infarction.  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  Extensive small vessel ischemic changes.  Mild intracranial atherosclerosis.  The visualized paranasal sinuses are essentially clear. The mastoid air cells are unopacified.  No evidence of calvarial fracture.  IMPRESSION: No evidence of acute intracranial abnormality.  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  Atrophy with extensive small vessel ischemic changes.   Original Report Authenticated By: Charline Bills, M.D.      ASSESSMENT: Syncope  PLAN:  Unclear etiology.  Await stress test results.  If stress test negative for ischemia, would plan on OP Lifewatch monitor to  looj for arrhythmia.  Corky Crafts., MD  01/31/2013  10:29 AM   Addendum: Echocardiogram showed normal left ventricle function.  Nuclear stress test showed the following:Stress images demonstrate subtle possible area of reversibility  involving the apical segment anteroseptal wall.  No large area of ischemia.  The patient has not had any type of anginal symptoms.  Would not pursue angiography given her lack of symptoms.  She is scheduled to receive an outpatient heart monitor for further rhythm monitoring.  Okay to discharge today from a cardiac standpoint.  Discussed with Dr. Shirlee Latch.

## 2013-01-31 NOTE — Progress Notes (Signed)
Pt provided with dc instructions and education. Pt verbalized understanding. All medications reviewed with patient. No questions at this time. IV removed with tip intact. Heart monitor cleaned and returned to front. Levonne Spiller, RN

## 2013-01-31 NOTE — Progress Notes (Signed)
Subjective: Patient denies chest pain, palpitations, lightheadedness.  She is ready for d/c though test results not complete   Objective: Vital signs in last 24 hours: Filed Vitals:   01/31/13 0905 01/31/13 0907 01/31/13 1052 01/31/13 1359  BP: 152/81 150/80 159/91 138/69  Pulse: 87 83 62 64  Temp:   97.4 F (36.3 C) 98.5 F (36.9 C)  TempSrc:      Resp:   18 18  Height:      Weight:      SpO2:   100% 100%   Weight change: 2 lb 3.3 oz (1 kg) No intake or output data in the 24 hours ending 01/31/13 1433 Vitals reviewed. General: resting in bed, NAD, alert and oriented x 3  HEENT: [place]/at, no scleral icterus Cardiac: RRR, no rubs, murmurs or gallops Pulm: clear to auscultation bilaterally, no wheezes, rales, or rhonchi Abd: soft, nontender, nondistended, BS present Ext: warm and well perfused, no pedal edema Neuro: alert and oriented X3, grossly neurologically intact moving all 4 extremities   Lab Results: Basic Metabolic Panel:  Recent Labs Lab 01/29/13 1714 01/30/13 0500  NA 140 138  K 4.1 4.0  CL 102 106  CO2 25 22  GLUCOSE 92 92  BUN 23 19  CREATININE 0.79 0.66  CALCIUM 9.7 8.9   Liver Function Tests:  Recent Labs Lab 01/29/13 1714 01/30/13 0500  AST 19 17  ALT 7 7  ALKPHOS 84 72  BILITOT 0.3 0.3  PROT 7.2 6.3  ALBUMIN 3.5 3.1*   CBC:  Recent Labs Lab 01/29/13 1714 01/30/13 0500 01/31/13 0505  WBC 5.0 3.5* 3.1*  NEUTROABS 3.7  --   --   HGB 13.7 12.3 12.3  HCT 40.2 35.7* 35.5*  MCV 83.4 82.8 83.1  PLT 153 153 156   Cardiac Enzymes:  Recent Labs Lab 01/29/13 2116 01/30/13 0519 01/30/13 0800  TROPONINI <0.30 <0.30 <0.30   Coagulation:  Recent Labs Lab 01/29/13 1714  LABPROT 12.7  INR 0.96   Urinalysis:  Recent Labs Lab 01/30/13 0322  COLORURINE YELLOW  LABSPEC 1.027  PHURINE 6.0  GLUCOSEU NEGATIVE  HGBUR NEGATIVE  BILIRUBINUR SMALL*  KETONESUR NEGATIVE  PROTEINUR NEGATIVE  UROBILINOGEN 1.0  NITRITE NEGATIVE   LEUKOCYTESUR NEGATIVE   Misc. Labs: None   Micro Results: No results found for this or any previous visit (from the past 240 hour(s)). Studies/Results: Dg Chest 2 View  01/29/2013   *RADIOLOGY REPORT*  Clinical Data: Near-syncope.  CHEST - 2 VIEW  Comparison: 10/09/2012.  Findings: The cardiac silhouette, mediastinal and hilar contours are within normal limits and stable.  There is tortuosity of the thoracic aorta.  Stable apical scarring changes on the right.  No acute pulmonary findings.  No pleural effusion.  The bony thorax is intact.  IMPRESSION: No acute cardiopulmonary findings.   Original Report Authenticated By: Rudie Meyer, M.D.   Ct Head Wo Contrast  01/29/2013   *RADIOLOGY REPORT*  Clinical Data: Near-syncope  CT HEAD WITHOUT CONTRAST  Technique:  Contiguous axial images were obtained from the base of the skull through the vertex without contrast.  Comparison: None.  Findings: No evidence of parenchymal hemorrhage or extra-axial fluid collection. No mass lesion, mass effect, or midline shift.  No CT evidence of acute infarction.  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  Extensive small vessel ischemic changes.  Mild intracranial atherosclerosis.  The visualized paranasal sinuses are essentially clear. The mastoid air cells are unopacified.  No evidence of calvarial fracture.  IMPRESSION: No  evidence of acute intracranial abnormality.  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  Atrophy with extensive small vessel ischemic changes.   Original Report Authenticated By: Charline Bills, M.D.   Nm Myocar Multi W/spect W/wall Motion / Ef  01/31/2013   *RADIOLOGY REPORT*  Clinical Data:  Hypertension.  Hyper lipidemia.  Syncope.  MYOCARDIAL IMAGING WITH SPECT (REST AND PHARMACOLOGIC-STRESS) GATED LEFT VENTRICULAR WALL MOTION STUDY LEFT VENTRICULAR EJECTION FRACTION  Standard myocardial SPECT imaging was performed after resting intravenous injection of 10. Tc-89m tetrofosmin.  Subsequently,  intravenous infusion of Lexiscan  was performed under the supervision of cardiology staff.  At peak effect of the drug, of Tc-49m tetrofosmin was injected intravenously and standard myocardial SPECT imaging was performed.  Quantitative gated imaging was also performed to evaluate left ventricular wall motion and estimated left ventricular ejection fraction.  Comparison:  Plain film chest of 01/29/2013  Findings:  Rest images demonstrate normal left ventricular cavity size.  No fixed defect.  Stress images demonstrate subtle possible area of reversibility involving the apical segment anteroseptal wall.  Equivocal decreased uptake on stress images involving the mid to basilar segment of the inferior lateral wall.  Favored to be related to diaphragmatic attenuation.  Most apparent on short axis images.  Evaluation of wall motion demonstrates normal left ventricular wall motion and thickening.  Ejection fraction is estimated at 83%.  End diastolic volume of 53 cc.  End systolic volume of  9 cc.  IMPRESSION:  1.  Possible area of reversibility involving the apical segment of the anteroseptal wall.  Mild inducible ischemia cannot be excluded. 2.  Favor diaphragmatic attenuation artifact involving the mid to basilar segment of the inferolateral wall. 3.  Normal left ventricular wall motion and thickening, with an ejection fraction estimated at 83%.   Original Report Authenticated By: Jeronimo Greaves, M.D.   Medications:  Scheduled Meds: . aspirin EC  81 mg Oral Daily  . atorvastatin  20 mg Oral q1800  . enoxaparin (LOVENOX) injection  40 mg Subcutaneous Q24H  . regadenoson      . sodium chloride  3 mL Intravenous Q12H   Continuous Infusions:  PRN Meds:.sodium chloride, sodium chloride Assessment/Plan: Principal Problem:   Syncope Active Problems:   Solitary pulmonary nodule   Hypertension   Sinus bradycardia   History of CVA (cerebrovascular accident)   First degree heart block   PAC (premature  atrial contraction)  1. Syncope  -Etiology unclear likely cardiac -5/28 Nuclear study:   1.  Possible area of reversibility involving the apical segment of the anteroseptal wall.  Mild inducible ischemia cannot be excluded. 2.  Favor diaphragmatic attenuation artifact involving the mid to basilar segment of the inferolateral wall. 3.  Normal left ventricular wall motion and thickening, with an ejection fraction estimated at 83%.  -will monitor via telemetry -Dr. Mayford Knife with cardiology consulted and following. She has pending 2D echo to be read.  She will need lifewatch monitor for 4 weeks for arrhythmias -pending further tx recs from cardiology  2. First degree heart block and asymptomatic bradycardia  - unknown baseline.  - Avoid AV nodal blocking agents.  - cardiology consulted and made recommendations.  See #1 will need further w/u  3. Hypertension history with hypotension  - hypotension noted on EMS arrival (per nursing note).  Holding antihypertensives (i.e Cozaar 50 mg qd) -Monitor VS.  BP is trending back up 138/69.    4. Apparent history of basal ganglia infarcts - CT head shows remote left BG  lacunar infarcts and extensive microvascular ischemic changes -aspirin 81 mg qd, Lipitor 20 mg qhs  (recent LDL of 168) -PT/OT consulted   5. History of RUL solitary lung nodule  - has been evaluated by Dr. Sherene Sires as recently as 10/2012 with decision to defer invasive workup unless large health issues arise. Pulmonary status stable per pt.  - Continue followup with Dr. Sherene Sires  6. Tobacco abuse -smoking cessation   7. F/E/N -cardiac diet   8. DVT Px -Lovenox    Dispo: Disposition is deferred at this time, awaiting improvement of current medical problems.  Anticipated discharge in approximately 1-2 day(s).   The patient does have a current PCP Della Goo C, MD), therefore will not be requiring OPC follow-up after discharge.   Unknown if the patient has transportation  limitations that hinder transportation to clinic appointments.  .Services Needed at time of discharge: Y = Yes, Blank = No PT: None   OT: None  RN:   Equipment:   Other:     LOS: 2 days   Annett Gula [phone] 01/31/2013, 2:33 PM

## 2013-02-02 NOTE — Discharge Summary (Signed)
Internal Medicine Teaching [facility] Discharge Note  Name: Kendra Nixon MRN: [id number] DOB: [birth year] 72 y.o.  Date of Admission: 01/29/2013  3:01 PM Date of Discharge: 01/31/2013 Attending Physician: Dr. Rogelia Boga  Discharge Diagnosis: 1. Syncope  2. First degree heart block and asymptomatic bradycardia  3. Hypertension history with hypotension  4. Apparent history of basal ganglia infarcts  5. History of right upper lung solitary lung nodule  6. Tobacco abuse   Discharge Medications:   Medication List    TAKE these medications       aspirin 81 MG EC tablet  Take 1 tablet (81 mg total) by mouth daily.     losartan 50 MG tablet  Commonly known as:  COZAAR  Take 50 mg by mouth daily.     lovastatin 40 MG tablet  Commonly known as:  MEVACOR  Take 1 tablet (40 mg total) by mouth at bedtime.        Disposition and follow-up:   [name] was discharged from [facility] in stable condition.  At the hospital follow up visit please address  1) further syncope work up via cardiology  2) medication compliance   Follow-up Appointments:     Follow-up Information   Follow up with [name], MD In 1 week.   Contact information:   [address] [place] [address] [phone]       Follow up with Corky Crafts., MD. Call in 1 week. (please call Dr. Eldridge Dace office for follow up appts. )    Contact information:   [address] [place] [address] [phone]        Consultations: Treatment Team:  Quintella Reichert, MD-[facility]   Procedures Performed:  Dg Chest 2 View  01/29/2013   *RADIOLOGY REPORT*  Clinical Data: Near-syncope.  CHEST - 2 VIEW  Comparison: 10/09/2012.  Findings: The cardiac silhouette, mediastinal and hilar contours are within normal limits and stable.  There is tortuosity of the thoracic aorta.  Stable apical scarring changes on the right.  No acute pulmonary  findings.  No pleural effusion.  The bony thorax is intact.  IMPRESSION: No acute cardiopulmonary findings.   Original Report Authenticated By: Rudie Meyer, M.D.   Ct Head Wo Contrast  01/29/2013   *RADIOLOGY REPORT*  Clinical Data: Near-syncope  CT HEAD WITHOUT CONTRAST  Technique:  Contiguous axial images were obtained from the base of the skull through the vertex without contrast.  Comparison: None.  Findings: No evidence of parenchymal hemorrhage or extra-axial fluid collection. No mass lesion, mass effect, or midline shift.  No CT evidence of acute infarction.  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  Extensive small vessel ischemic changes.  Mild intracranial atherosclerosis.  The visualized paranasal sinuses are essentially clear. The mastoid air cells are unopacified.  No evidence of calvarial fracture.  IMPRESSION: No evidence of acute intracranial abnormality.  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  Atrophy with extensive small vessel ischemic changes.   Original Report Authenticated By: Charline Bills, M.D.   Nm Myocar Multi W/spect W/wall Motion / Ef  01/31/2013   *RADIOLOGY REPORT*  Clinical Data:  Hypertension.  Hyper lipidemia.  Syncope.  MYOCARDIAL IMAGING WITH SPECT (REST AND PHARMACOLOGIC-STRESS) GATED LEFT VENTRICULAR WALL MOTION STUDY LEFT VENTRICULAR EJECTION FRACTION  Standard myocardial SPECT imaging was performed after resting intravenous injection of 10. Tc-32m tetrofosmin.  Subsequently, intravenous infusion of Lexiscan  was performed under the supervision of cardiology staff.  At  peak effect of the drug, of Tc-54m tetrofosmin was injected intravenously and standard myocardial SPECT imaging was performed.  Quantitative gated imaging was also performed to evaluate left ventricular wall motion and estimated left ventricular ejection fraction.  Comparison:  Plain film chest of 01/29/2013  Findings:  Rest images demonstrate normal left ventricular cavity size.  No fixed  defect.  Stress images demonstrate subtle possible area of reversibility involving the apical segment anteroseptal wall.  Equivocal decreased uptake on stress images involving the mid to basilar segment of the inferior lateral wall.  Favored to be related to diaphragmatic attenuation.  Most apparent on short axis images.  Evaluation of wall motion demonstrates normal left ventricular wall motion and thickening.  Ejection fraction is estimated at 83%.  End diastolic volume of 53 cc.  End systolic volume of  9 cc.  IMPRESSION:  1.  Possible area of reversibility involving the apical segment of the anteroseptal wall.  Mild inducible ischemia cannot be excluded. 2.  Favor diaphragmatic attenuation artifact involving the mid to basilar segment of the inferolateral wall. 3.  Normal left ventricular wall motion and thickening, with an ejection fraction estimated at 83%.   Original Report Authenticated By: Jeronimo Greaves, M.D.    2D Echo:  01/31/13   Study Conclusions  - Left ventricle: The cavity size was normal. There was mild focal basal hypertrophy of the septum. Systolic function was vigorous. The estimated ejection fraction was in the range of 65% to 70%. Wall motion was normal; there were no regional wall motion abnormalities. - Aortic valve: Trivial regurgitation. - Mitral valve: Moderate prolapse, involving the anterior leaflet. Mild regurgitation. - Pulmonary arteries: PA peak pressure: 33mm Hg (S).   Cardiac Cath: none   Admission HPI:  Chief Complaint: Syncope  History of Present Illness:  Patient is a 72 y.o. female with a PMHx of hypertension, who presents to [facility] for evaluation of syncope. Patient indicates that she was outside at a cookout for approximately 2 hours (she ate well, however, did not drink any fluids during that time). Pt went upstairs to put some items into her apartment. She walked downstairs, started to experience some blurriness of vision without dizziness, lightheadedness,  chest pain, palpitations, shortness of breath. The patient sat down and was found to have passed out for approximately 2 minutes, noted by friends. Immediately after she came to, the patient felt back to her baseline. After the episode, the patient experienced some nausea without vomiting, chest pain, shortness of breath. She has otherwise been well without fevers, chills.  Review of Systems:  Constitutional:  denies fever, chills, diaphoresis, appetite change and fatigue.   HEENT:  admits to blurriness of vision.  Denies eye pain, hearing loss, ear pain, ear discharge, nasal congestion, sore throat.   Respiratory:  denies SOB, DOE, cough, chest tightness, and wheezing.   Cardiovascular:  denies chest pain, palpitations and leg swelling.   Gastrointestinal:  admits to nausea.  Denies vomiting, abdominal pain, diarrhea, constipation, blood in stool.   Genitourinary:  denies dysuria, urgency, frequency, hematuria, flank pain.   Musculoskeletal:  admits to chronic arthralgias in her BL knees and ankles (recently increased her exercise)  Denies myalgias, back pain, joint swelling, and gait problem.   Skin:  denies rash and wound.   Neurological:  admits to syncope on day of admission without recurrence.  Denies dizziness, seizures, weakness, light-headedness, numbness and headaches.   Hematological:  denies easy bruising, personal or family bleeding history.   Psychiatric:  denies anxiety, depression, suicidal / homicidal ideation.    Vital Signs:  Blood pressure 139/78, pulse 56, temperature 98.3 F (36.8 C), temperature source Oral, resp. rate 18, height 5\' 6"  (1.676 m), weight 114 lb 10.2 oz (52 kg), SpO2 98.00%.  Physical Exam:  General:  Vital signs reviewed and noted. Well-developed, well-nourished, in no acute distress; alert, appropriate and cooperative throughout examination. Family at bedside   Head:  Normocephalic, atraumatic.   Eyes:  PERRL, EOMI, No signs of anemia or jaundince.    Nose:  Mucous membranes moist, not inflammed, nonerythematous.   Throat:  Oropharynx nonerythematous, no exudate appreciated.   Neck:  No deformities, masses, or tenderness noted. Supple, No carotid Bruits, no JVD.   Lungs:  Normal respiratory effort. Clear to auscultation BL without crackles or wheezes.   Heart:  RRR. S1 and S2 normal without gallop, murmur, or rubs.   Abdomen:  BS normoactive. Soft, Nondistended, non-tender. No masses or organomegaly.   Extremities:  No pretibial edema.   Neurologic:  A&O X3, CN II - XII are grossly intact. Motor strength is 5/5 in the all 4 extremities, Sensations intact to light touch, Cerebellar signs negative.   Skin:  No visible rashes, scars.      Hospital Course by problem list: 1. Syncope  2. First degree heart block and asymptomatic bradycardia  3. Hypertension history with hypotension  4. Apparent history of basal ganglia infarcts  5. History of right upper lung solitary lung nodule  6. Tobacco abuse   1. Syncope  This a new problem.  Etiology unclear likely cardiac.  Cardiac enzymes were negative x 3.  5/28 Nuclear study with possible area of reversibility involving the apical segment of the anteroseptal wall, mild inducible ischemia cannot be excluded.  Favor diaphragmatic attenuation artifact involving the mid to basilar segment of the inferolateral wall. Normal left ventricular wall motion and thickening, with an ejection fraction estimated at 83%. Turner with cardiology consulted and followed. She had 2D echo with mild focal basal hypertrophy of the septum. Systolic function was vigorous. The estimated ejection fraction was in the range of 65% to 70%. Wall motion was normal; there were no regional wall motion abnormalities.  Aortic valve: Trivial regurgitation. Mitral valve: Moderate prolapse, involving the anterior leaflet. Mild regurgitation.  Pulmonary arteries: PA peak pressure: 33mm Hg (S).  After results cardiology (Dr. Eldridge Dace) felt  the patient was stable for discharge as she was not habing anginal symptoms so no further work up at this time with angiography.  She will need lifewatch monitor for 4 weeks for arrhythmias which will be set up outpatient.  We resumed her Cozaar and discharge on her on Aspirin 81 mg daily, Lovastatin 40 mg daily.  She will follow up with [facility] cardiology outpatient 02/09/13 at 10 am [phone].     2. First degree heart block and asymptomatic bradycardia  1st degree AV block noted on EKG and transient Wenkebach.  Also noted were blocked premature atrial contractions, short burst of atrial tachycardia versus atrial flutter 3:1 block. Orthostatics negative.  One should avoid AV nodal blocking agents.  Cardiology consulted and made recommendations for life watch monitor outpatient. See #1.  She will follow up with [facility] cardiology outpatient 02/09/13 at 10 am [phone].    3. Hypertension history with hypotension  Hypotension noted by EMS on arrival (per nursing note). Holding antihypertensives (i.e Cozaar 50 mg daily).  Blood pressure trended up by discharge.  Resumed home dose of Cozaar at discharge.  Consider lower dose in the future if patient still hypotensive.    4. Apparent history of basal ganglia infarcts  CT head shows remote left basal ganglia lacunar infarcts and extensive microvascular ischemic changes.  The patient did not know about this stroke.  She will continue Aspirin 81 mg qd, Lipitor 20 mg qhs (recent LDL of 189 10/30/12).  PT/OT consulted with no outpatient recommendations.   5. History of right upper lung solitary lung nodule  Evaluated by Dr. Sherene Sires as recently as 10/2012 with decision to defer invasive workup unless large health issues arise.  Pulmonary status stable. Continue followup with Dr. Sherene Sires outpatient.   6. Tobacco abuse  She had smoking cessation counseling prior to discharge.   She had Lovenox for DVT prophylaxis   Discharge Vitals:  BP 138/69  Pulse 64  Temp(Src) 98.5  F (36.9 C) (Oral)  Resp 18  Ht 5\' 6"  (1.676 m)  Wt 116 lb 13.5 oz (53 kg)  BMI 18.87 kg/m2  SpO2 100%  Discharge physical exam General: resting in bed, NAD, alert and oriented x 3  HEENT: Francis/at, no scleral icterus  Cardiac: RRR, no rubs, murmurs or gallops  Pulm: clear to auscultation bilaterally, no wheezes, rales, or rhonchi  Abd: soft, nontender, nondistended, BS present  Ext: warm and well perfused, no pedal edema  Neuro: alert and oriented X3, grossly neurologically intact moving all 4 extremities   Discharge Labs:  Results for Kendra Nixon, Kendra Nixon (MRN [id number]) as of 02/02/2013 16:31  Ref. Range 01/29/2013 17:14 01/30/2013 05:00  Sodium Latest Range: 135-145 mEq/L 140 138  Potassium Latest Range: 3.5-5.1 mEq/L 4.1 4.0  Chloride Latest Range: 96-112 mEq/L 102 106  CO2 Latest Range: 19-32 mEq/L 25 22  BUN Latest Range: 6-23 mg/dL 23 19  Creatinine Latest Range: 0.50-1.10 mg/dL 5.62 1.30  Calcium Latest Range: 8.4-10.5 mg/dL 9.7 8.9  GFR calc non Af Amer Latest Range: >90 mL/min 80 (L) 85 (L)  GFR calc Af Amer Latest Range: >90 mL/min >90 >90  Glucose Latest Range: 70-99 mg/dL 92 92  Alkaline Phosphatase Latest Range: 39-117 U/L 84 72  Albumin Latest Range: 3.5-5.2 g/dL 3.5 3.1 (L)  AST Latest Range: 0-37 U/L 19 17  ALT Latest Range: 0-35 U/L 7 7  Total Protein Latest Range: 6.0-8.3 g/dL 7.2 6.3  Total Bilirubin Latest Range: 0.3-1.2 mg/dL 0.3 0.3   Results for Kendra Nixon, Kendra Nixon (MRN [id number]) as of 02/02/2013 16:31  Ref. Range 01/29/2013 17:37 01/29/2013 21:16 01/30/2013 05:19 01/30/2013 08:00  Troponin I Latest Range: <0.30 ng/mL  <0.30 <0.30 <0.30  Troponin i, poc Latest Range: 0.00-0.08 ng/mL 0.00      Results for Kendra Nixon, Kendra Nixon (MRN [id number]) as of 02/02/2013 16:31  Ref. Range 01/29/2013 17:14 01/30/2013 05:00 01/31/2013 05:05  WBC Latest Range: 4.0-10.5 K/uL 5.0 3.5 (L) 3.1 (L)  RBC Latest Range: 3.87-5.11 MIL/uL 4.82 4.31 4.27  Hemoglobin Latest Range: 12.0-15.0 g/dL 44.0 10.2  72.5  HCT Latest Range: 36.0-46.0 % 40.2 35.7 (L) 35.5 (L)  MCV Latest Range: 78.0-100.0 fL 83.4 82.8 83.1  MCH Latest Range: 26.0-34.0 pg 28.4 28.5 28.8  MCHC Latest Range: 30.0-36.0 g/dL 36.6 44.0 34.7  RDW Latest Range: 11.5-15.5 % 14.5 14.5 14.4  Platelets Latest Range: 150-400 K/uL 153 153 156  Neutrophils Relative % Latest Range: 43-77 % 74    Lymphocytes Relative Latest Range: 12-46 % 18    Monocytes Relative Latest Range: 3-12 % 6    Eosinophils Relative Latest Range: 0-5 % 2    Basophils  Relative Latest Range: 0-1 % 0    NEUT# Latest Range: 1.7-7.7 K/uL 3.7    Lymphocytes Absolute Latest Range: 0.7-4.0 K/uL 0.9    Monocytes Absolute Latest Range: 0.1-1.0 K/uL 0.3    Eosinophils Absolute Latest Range: 0.0-0.7 K/uL 0.1    Basophils Absolute Latest Range: 0.0-0.1 K/uL 0.0      Results for Kendra Nixon, Kendra Nixon (MRN [id number]) as of 02/02/2013 16:31  Ref. Range 01/29/2013 17:14  Prothrombin Time Latest Range: 11.6-15.2 seconds 12.7  INR Latest Range: 0.00-1.49  0.96   Results for Kendra Nixon, Kendra Nixon (MRN [id number]) as of 02/02/2013 16:31  Ref. Range 01/30/2013 03:22  Color, Urine Latest Range: YELLOW  YELLOW  APPearance Latest Range: CLEAR  CLEAR  Specific Gravity, Urine Latest Range: 1.005-1.030  1.027  pH Latest Range: 5.0-8.0  6.0  Glucose Latest Range: NEGATIVE mg/dL NEGATIVE  Bilirubin Urine Latest Range: NEGATIVE  SMALL (A)  Ketones, ur Latest Range: NEGATIVE mg/dL NEGATIVE  Protein Latest Range: NEGATIVE mg/dL NEGATIVE  Urobilinogen, UA Latest Range: 0.0-1.0 mg/dL 1.0  Nitrite Latest Range: NEGATIVE  NEGATIVE  Leukocytes, UA Latest Range: NEGATIVE  NEGATIVE  Hgb urine dipstick Latest Range: NEGATIVE  NEGATIVE    Signed: Annett Gula 02/02/2013, 4:30 PM   Time Spent on Discharge: >30 minutes  Services Ordered on Discharge: none Equipment Ordered on Discharge: none

## 2014-06-03 ENCOUNTER — Ambulatory Visit (HOSPITAL_COMMUNITY): Payer: Medicare Other | Attending: Physician Assistant

## 2014-06-03 ENCOUNTER — Emergency Department (INDEPENDENT_AMBULATORY_CARE_PROVIDER_SITE_OTHER)
Admission: EM | Admit: 2014-06-03 | Discharge: 2014-06-03 | Disposition: A | Discharge status: Home / Self Care | Payer: Medicare Other | Source: Home / Self Care | Attending: Emergency Medicine | Admitting: Emergency Medicine

## 2014-06-03 ENCOUNTER — Encounter (HOSPITAL_COMMUNITY): Payer: Self-pay | Admitting: Emergency Medicine

## 2014-06-03 DIAGNOSIS — R05 Cough: Secondary | ICD-10-CM | POA: Diagnosis present

## 2014-06-03 DIAGNOSIS — J449 Chronic obstructive pulmonary disease, unspecified: Secondary | ICD-10-CM | POA: Diagnosis not present

## 2014-06-03 DIAGNOSIS — J441 Chronic obstructive pulmonary disease with (acute) exacerbation: Secondary | ICD-10-CM

## 2014-06-03 DIAGNOSIS — J4489 Other specified chronic obstructive pulmonary disease: Secondary | ICD-10-CM | POA: Insufficient documentation

## 2014-06-03 DIAGNOSIS — R059 Cough, unspecified: Secondary | ICD-10-CM | POA: Diagnosis present

## 2014-06-03 MED ORDER — BENZONATATE 100 MG PO CAPS: 100.0000 mg | ORAL_CAPSULE | Freq: Three times a day (TID) | ORAL | Status: DC | PRN

## 2014-06-03 MED ORDER — PREDNISONE 10 MG PO TABS: 30.0000 mg | ORAL_TABLET | Freq: Every day | ORAL | Status: DC

## 2014-06-03 MED ORDER — ALBUTEROL SULFATE HFA 108 (90 BASE) MCG/ACT IN AERS: 1.0000 | INHALATION_SPRAY | Freq: Four times a day (QID) | RESPIRATORY_TRACT | Status: DC | PRN

## 2014-06-03 NOTE — ED Notes (Signed)
Radiology called and made aware patient was on the way

## 2014-06-03 NOTE — ED Notes (Signed)
Pt  Reports  Symptoms  Of  A  Non  Productive   Cough  X  10  Days    She  Is  Ambulatory  To  Exam  Room  In  No  Acute dsrtress  Speaking in  Complete  sentances

## 2014-06-03 NOTE — ED Provider Notes (Signed)
CSN: [id number]     Arrival date & time 06/03/14  4098 History   First MD Initiated Contact with Patient 06/03/14 [phone]     Chief Complaint  Patient presents with  . Cough   (Consider location/radiation/quality/duration/timing/severity/associated sxs/prior Treatment) Patient is a 73 y.o. female presenting with cough. The history is provided by the patient.  Cough Cough characteristics:  Dry Severity:  Moderate Onset quality:  Gradual Duration:  2 weeks Progression:  Waxing and waning Chronicity:  New Smoker: yes   Associated symptoms: no chest pain, no chills, no diaphoresis, no ear fullness, no ear pain, no eye discharge, no fever, no headaches, no myalgias, no rash, no rhinorrhea, no shortness of breath, no sinus congestion, no sore throat, no weight loss and no wheezing   Associated symptoms comment:  Denies heartburn, chest pain, hemoptysis or acid reflux   Past Medical History  Diagnosis Date  . Hypertension   . ACE-inhibitor cough   . History of CVA (cerebrovascular accident)     Old left basal ganglion lacunar infarct noted per CT head (01/2013)  . Solitary lung nodule     4 mm right upper lobe nodule  . Hyperlipidemia LDL goal < 100    Past Surgical History  Procedure Laterality Date  . Breast lumpectomy Bilateral age 78   Family History  Problem Relation Age of Onset  . Breast cancer Sister 25  . Hypertension    . Alzheimer's disease Maternal Grandmother   . Alzheimer's disease Maternal Aunt    History  Substance Use Topics  . Smoking status: Former Smoker -- 0.25 packs/day for 30 years    Types: Cigarettes    Quit date: 10/27/2012  . Smokeless tobacco: Never Used  . Alcohol Use: No   OB History   Grav Para Term Preterm Abortions TAB SAB Ect Mult Living                 Review of Systems  Constitutional: Negative for fever, chills, weight loss and diaphoresis.  HENT: Negative for ear pain, rhinorrhea and sore throat.   Eyes: Negative for discharge.   Respiratory: Positive for cough. Negative for shortness of breath and wheezing.   Cardiovascular: Negative for chest pain.  Musculoskeletal: Negative for myalgias.  Skin: Negative for rash.  Neurological: Negative for headaches.    Allergies  Review of patient's allergies indicates no known allergies.  Home Medications   Prior to Admission medications   Medication Sig Start Date End Date Taking? Authorizing Provider  albuterol (PROVENTIL HFA;VENTOLIN HFA) 108 (90 BASE) MCG/ACT inhaler Inhale 1-2 puffs into the lungs every 6 (six) hours as needed for wheezing or shortness of breath. 06/03/14   [name] Viona Hosking, PA  aspirin EC 81 MG EC tablet Take 1 tablet (81 mg total) by mouth daily. 01/31/13   Annett Gula, MD  benzonatate (TESSALON) 100 MG capsule Take 1 capsule (100 mg total) by mouth 3 (three) times daily as needed for cough. 06/03/14   [name] Makinzy Cleere, PA  losartan (COZAAR) 50 MG tablet Take 50 mg by mouth daily.    Historical Provider, MD  lovastatin (MEVACOR) 40 MG tablet Take 1 tablet (40 mg total) by mouth at bedtime. 01/31/13   Annett Gula, MD  predniSONE (DELTASONE) 10 MG tablet Take 3 tablets (30 mg total) by mouth daily. X 3 days 06/03/14   [name] Faizah Kandler, PA   BP 174/92  Pulse 61  Temp(Src) 97.1 F (36.2 C) (Oral)  Resp 14  SpO2 96% Physical Exam  Nursing note and vitals reviewed. Constitutional: She is oriented to person, place, and time. She appears well-developed and well-nourished. No distress.  HENT:  Head: Normocephalic and atraumatic.  Right Ear: Hearing, tympanic membrane, external ear and ear canal normal.  Left Ear: Hearing, tympanic membrane, external ear and ear canal normal.  Nose: Nose normal.  Mouth/Throat: Uvula is midline, oropharynx is clear and moist and mucous membranes are normal.  Eyes: Conjunctivae are normal. No scleral icterus.  Neck: Normal range of motion. Neck supple.  Cardiovascular: Normal rate, regular rhythm  and normal heart sounds.   Pulmonary/Chest: Effort normal and breath sounds normal. No respiratory distress. She has no wheezes.  Abdominal: Soft. Bowel sounds are normal. She exhibits no distension. There is no tenderness.  Musculoskeletal: Normal range of motion.  Lymphadenopathy:    She has no cervical adenopathy.  Neurological: She is alert and oriented to person, place, and time.  Skin: Skin is warm and dry. No rash noted. No erythema.  Psychiatric: She has a normal mood and affect. Her behavior is normal.    ED Course  Procedures (including critical care time) Labs Review Labs Reviewed - No data to display  Imaging Review Dg Chest 2 View  06/03/2014   CLINICAL DATA:  Cough.  EXAM: CHEST  2 VIEW  COMPARISON:  01/29/2013  FINDINGS: There is hyperinflation of the lungs compatible with COPD. Heart and mediastinal contours are within normal limits. No focal opacities or effusions. No acute bony abnormality.  IMPRESSION: COPD.  No active disease.   Electronically Signed   By: Charlett Nose M.D.   On: 06/03/2014 11:28     MDM   1. COPD exacerbation    CXR unremarkable for acute disease. I suspect that this patient is have a very mild COPD exacerbation from recent URI. Will treat with short course of prednisone, bronchodilator and tessalon for cough suppression and advise close follow up with her PCP if symptoms persist or worsen. Patient understands that if symptoms become suddenly worse or severe, she is to seek treatment at her nearest ER.    [name], [place] 06/03/14 1137

## 2014-06-03 NOTE — Discharge Instructions (Signed)
Your chest xray was without evidence of pneumonia. Please use medications as prescribed for cough and if symptoms do not improve, please follow up with your doctor. If symptoms become suddenly worse or severe, please seek treatment at your nearest emergency room.  Chronic Asthmatic Bronchitis Chronic asthmatic bronchitis is a complication of persistent asthma. After a period of time with asthma, some people develop airflow obstruction that is present all the time, even when not having an asthma attack.There is also persistent inflammation of the airways, and the bronchial tubes produce more mucus. Chronic asthmatic bronchitis usually is a permanent problem with the lungs. CAUSES  Chronic asthmatic bronchitis happens most often in people who have asthma and also smoke cigarettes. Occasionally, it can happen to a person with long-standing or severe asthma even if the person is not a smoker. SIGNS AND SYMPTOMS  Chronic asthmatic bronchitis usually causes symptoms of both asthma and chronic bronchitis, including:   Coughing.  Increased sputum production.  Wheezing and shortness of breath.  Chest discomfort.  Recurring infections. DIAGNOSIS  Your health care provider will take a medical history and perform a physical exam. Chronic asthmatic bronchitis is suspected when a person with asthma has abnormal results on breathing tests (pulmonary function tests) even when breathing symptoms are at their best. Other tests, such as a chest X-ray, may be performed to rule out other conditions.  TREATMENT  Treatment involves controlling symptoms with medicine and lifestyle changes.  Your health care provider may prescribe asthma medicines, including inhaler and nebulizer medicines.  Infection can be treated with medicine to kill germs (antibiotics). Serious infections may require hospitalization. These can include:  Pneumonia.  Sinus infections.  Acute bronchitis.   Preventing infection and  hospitalization is very important. Get an influenza vaccination every year as directed by your health care provider. Ask your health care provider whether you need a pneumonia vaccine.  Ask your health care provider whether you would benefit from a pulmonary rehabilitation program. HOME CARE INSTRUCTIONS  Take medicines only as directed by your health care provider.  If you are a cigarette smoker, the most important thing that you can do is quit. Talk to your health care provider for help with quitting smoking.  Avoid pollen, dust, animal dander, molds, smoke, and other things that cause attacks.  Regular exercise is very important to help you feel better. Discuss possible exercise routines with your health care provider.  If animal dander is the cause of asthma, you may not be able to keep pets.  It is important that you:  Become educated about your medical condition.  Participate in maintaining wellness.  Seek medical care as directed. Delay in seeking medical care could cause permanent injury and may be a risk to your life. SEEK MEDICAL CARE IF:  You have wheezing and shortness of breath even if taking medicine to prevent attacks.  You have muscle aches, chest pain, or thickening of sputum.  Your sputum changes from clear or white to yellow, green, gray, or bloody. SEEK IMMEDIATE MEDICAL CARE IF:  Your usual medicines do not stop your wheezing.  You have increased coughing or shortness of breath or both.  You have increased difficulty breathing.  You have any problems from the medicine you are taking, such as a rash, itching, swelling, or trouble breathing. MAKE SURE YOU:   Understand these instructions.  Will watch your condition.  Will get help right away if you are not doing well or get worse. Document Released: 06/10/2006 Document Revised:  01/07/2014 Document Reviewed: 10/01/2013 [employer] Patient Information 2015 [place], [place]. This information is not intended  to replace advice given to you by your health care provider. Make sure you discuss any questions you have with your health care provider.  Chronic Obstructive Pulmonary Disease Chronic obstructive pulmonary disease (COPD) is a common lung condition in which airflow from the lungs is limited. COPD is a general term that can be used to describe many different lung problems that limit airflow, including both chronic bronchitis and emphysema. If you have COPD, your lung function will probably never return to normal, but there are measures you can take to improve lung function and make yourself feel better.  CAUSES   Smoking (common).   Exposure to secondhand smoke.   Genetic problems.  Chronic inflammatory lung diseases or recurrent infections. SYMPTOMS   Shortness of breath, especially with physical activity.   Deep, persistent (chronic) cough with a large amount of thick mucus.   Wheezing.   Rapid breaths (tachypnea).   Gray or bluish discoloration (cyanosis) of the skin, especially in fingers, toes, or lips.   Fatigue.   Weight loss.   Frequent infections or episodes when breathing symptoms become much worse (exacerbations).   Chest tightness. DIAGNOSIS  Your health care provider will take a medical history and perform a physical examination to make the initial diagnosis. Additional tests for COPD may include:   Lung (pulmonary) function tests.  Chest X-ray.  CT scan.  Blood tests. TREATMENT  Treatment available to help you feel better when you have COPD includes:   Inhaler and nebulizer medicines. These help manage the symptoms of COPD and make your breathing more comfortable.  Supplemental oxygen. Supplemental oxygen is only helpful if you have a low oxygen level in your blood.   Exercise and physical activity. These are beneficial for nearly all people with COPD. Some people may also benefit from a pulmonary rehabilitation program. HOME CARE  INSTRUCTIONS   Take all medicines (inhaled or pills) as directed by your health care provider.  Avoid over-the-counter medicines or cough syrups that dry up your airway (such as antihistamines) and slow down the elimination of secretions unless instructed otherwise by your health care provider.   If you are a smoker, the most important thing that you can do is stop smoking. Continuing to smoke will cause further lung damage and breathing trouble. Ask your health care provider for help with quitting smoking. He or she can direct you to community resources or hospitals that provide support.  Avoid exposure to irritants such as smoke, chemicals, and fumes that aggravate your breathing.  Use oxygen therapy and pulmonary rehabilitation if directed by your health care provider. If you require home oxygen therapy, ask your health care provider whether you should purchase a pulse oximeter to measure your oxygen level at home.   Avoid contact with individuals who have a contagious illness.  Avoid extreme temperature and humidity changes.  Eat healthy foods. Eating smaller, more frequent meals and resting before meals may help you maintain your strength.  Stay active, but balance activity with periods of rest. Exercise and physical activity will help you maintain your ability to do things you want to do.  Preventing infection and hospitalization is very important when you have COPD. Make sure to receive all the vaccines your health care provider recommends, especially the pneumococcal and influenza vaccines. Ask your health care provider whether you need a pneumonia vaccine.  Learn and use relaxation techniques to manage stress.  Learn and use controlled breathing techniques as directed by your health care provider. Controlled breathing techniques include:   Pursed lip breathing. Start by breathing in (inhaling) through your nose for 1 second. Then, purse your lips as if you were going to whistle  and breathe out (exhale) through the pursed lips for 2 seconds.   Diaphragmatic breathing. Start by putting one hand on your abdomen just above your waist. Inhale slowly through your nose. The hand on your abdomen should move out. Then purse your lips and exhale slowly. You should be able to feel the hand on your abdomen moving in as you exhale.   Learn and use controlled coughing to clear mucus from your lungs. Controlled coughing is a series of short, progressive coughs. The steps of controlled coughing are:  1. Lean your head slightly forward.  2. Breathe in deeply using diaphragmatic breathing.  3. Try to hold your breath for 3 seconds.  4. Keep your mouth slightly open while coughing twice.  5. Spit any mucus out into a tissue.  6. Rest and repeat the steps once or twice as needed. SEEK MEDICAL CARE IF:   You are coughing up more mucus than usual.   There is a change in the color or thickness of your mucus.   Your breathing is more labored than usual.   Your breathing is faster than usual.  SEEK IMMEDIATE MEDICAL CARE IF:   You have shortness of breath while you are resting.   You have shortness of breath that prevents you from:  Being able to talk.   Performing your usual physical activities.   You have chest pain lasting longer than 5 minutes.   Your skin color is more cyanotic than usual.  You measure low oxygen saturations for longer than 5 minutes with a pulse oximeter. MAKE SURE YOU:   Understand these instructions.  Will watch your condition.  Will get help right away if you are not doing well or get worse. Document Released: 06/02/2005 Document Revised: 01/07/2014 Document Reviewed: 04/19/2013 [facility] Patient Information 2015 [place], [place]. This information is not intended to replace advice given to you by your health care provider. Make sure you discuss any questions you have with your health care provider.  Cough, Adult  A cough is a  reflex that helps clear your throat and airways. It can help heal the body or may be a reaction to an irritated airway. A cough may only last 2 or 3 weeks (acute) or may last more than 8 weeks (chronic).  CAUSES Acute cough:  Viral or bacterial infections. Chronic cough:  Infections.  Allergies.  Asthma.  Post-nasal drip.  Smoking.  Heartburn or acid reflux.  Some medicines.  Chronic lung problems (COPD).  Cancer. SYMPTOMS   Cough.  Fever.  Chest pain.  Increased breathing rate.  High-pitched whistling sound when breathing (wheezing).  Colored mucus that you cough up (sputum). TREATMENT   A bacterial cough may be treated with antibiotic medicine.  A viral cough must run its course and will not respond to antibiotics.  Your caregiver may recommend other treatments if you have a chronic cough. HOME CARE INSTRUCTIONS   Only take over-the-counter or prescription medicines for pain, discomfort, or fever as directed by your caregiver. Use cough suppressants only as directed by your caregiver.  Use a cold steam vaporizer or humidifier in your bedroom or home to help loosen secretions.  Sleep in a semi-upright position if your cough is worse at night.  Rest as needed.  Stop smoking if you smoke. SEEK IMMEDIATE MEDICAL CARE IF:   You have pus in your sputum.  Your cough starts to worsen.  You cannot control your cough with suppressants and are losing sleep.  You begin coughing up blood.  You have difficulty breathing.  You develop pain which is getting worse or is uncontrolled with medicine.  You have a fever. MAKE SURE YOU:   Understand these instructions.  Will watch your condition.  Will get help right away if you are not doing well or get worse. Document Released: 02/19/2011 Document Revised: 11/15/2011 Document Reviewed: 02/19/2011 [facility] Patient Information 2015 [place], [place]. This information is not intended to replace advice given to  you by your health care provider. Make sure you discuss any questions you have with your health care provider.  How to Use an Inhaler Proper inhaler technique is very important. Good technique ensures that the medicine reaches the lungs. Poor technique results in depositing the medicine on the tongue and back of the throat rather than in the airways. If you do not use the inhaler with good technique, the medicine will not help you. STEPS TO FOLLOW IF USING AN INHALER WITHOUT AN EXTENSION TUBE 1. Remove the cap from the inhaler. 2. If you are using the inhaler for the first time, you will need to prime it. Shake the inhaler for 5 seconds and release four puffs into the air, away from your face. Ask your health care provider or pharmacist if you have questions about priming your inhaler. 3. Shake the inhaler for 5 seconds before each breath in (inhalation). 4. Position the inhaler so that the top of the canister faces up. 5. Put your index finger on the top of the medicine canister. Your thumb supports the bottom of the inhaler. 6. Open your mouth. 7. Either place the inhaler between your teeth and place your lips tightly around the mouthpiece, or hold the inhaler 1-2 inches away from your open mouth. If you are unsure of which technique to use, ask your health care provider. 8. Breathe out (exhale) normally and as completely as possible. 9. Press the canister down with your index finger to release the medicine. 10. At the same time as the canister is pressed, inhale deeply and slowly until your lungs are completely filled. This should take 4-6 seconds. Keep your tongue down. 11. Hold the medicine in your lungs for 5-10 seconds (10 seconds is best). This helps the medicine get into the small airways of your lungs. 12. Breathe out slowly, through pursed lips. Whistling is an example of pursed lips. 13. Wait at least 15-30 seconds between puffs. Continue with the above steps until you have taken the  number of puffs your health care provider has ordered. Do not use the inhaler more than your health care provider tells you. 14. Replace the cap on the inhaler. 15. Follow the directions from your health care provider or the inhaler insert for cleaning the inhaler. STEPS TO FOLLOW IF USING AN INHALER WITH AN EXTENSION (SPACER) 1. Remove the cap from the inhaler. 2. If you are using the inhaler for the first time, you will need to prime it. Shake the inhaler for 5 seconds and release four puffs into the air, away from your face. Ask your health care provider or pharmacist if you have questions about priming your inhaler. 3. Shake the inhaler for 5 seconds before each breath in (inhalation). 4. Place the open end of the spacer  onto the mouthpiece of the inhaler. 5. Position the inhaler so that the top of the canister faces up and the spacer mouthpiece faces you. 6. Put your index finger on the top of the medicine canister. Your thumb supports the bottom of the inhaler and the spacer. 7. Breathe out (exhale) normally and as completely as possible. 8. Immediately after exhaling, place the spacer between your teeth and into your mouth. Close your lips tightly around the spacer. 9. Press the canister down with your index finger to release the medicine. 10. At the same time as the canister is pressed, inhale deeply and slowly until your lungs are completely filled. This should take 4-6 seconds. Keep your tongue down and out of the way. 11. Hold the medicine in your lungs for 5-10 seconds (10 seconds is best). This helps the medicine get into the small airways of your lungs. Exhale. 12. Repeat inhaling deeply through the spacer mouthpiece. Again hold that breath for up to 10 seconds (10 seconds is best). Exhale slowly. If it is difficult to take this second deep breath through the spacer, breathe normally several times through the spacer. Remove the spacer from your mouth. 13. Wait at least 15-30 seconds  between puffs. Continue with the above steps until you have taken the number of puffs your health care provider has ordered. Do not use the inhaler more than your health care provider tells you. 14. Remove the spacer from the inhaler, and place the cap on the inhaler. 15. Follow the directions from your health care provider or the inhaler insert for cleaning the inhaler and spacer. If you are using different kinds of inhalers, use your quick relief medicine to open the airways 10-15 minutes before using a steroid if instructed to do so by your health care provider. If you are unsure which inhalers to use and the order of using them, ask your health care provider, nurse, or respiratory therapist. If you are using a steroid inhaler, always rinse your mouth with water after your last puff, then gargle and spit out the water. Do not swallow the water. AVOID:  Inhaling before or after starting the spray of medicine. It takes practice to coordinate your breathing with triggering the spray.  Inhaling through the nose (rather than the mouth) when triggering the spray. HOW TO DETERMINE IF YOUR INHALER IS FULL OR NEARLY EMPTY You cannot know when an inhaler is empty by shaking it. A few inhalers are now being made with dose counters. Ask your health care provider for a prescription that has a dose counter if you feel you need that extra help. If your inhaler does not have a counter, ask your health care provider to help you determine the date you need to refill your inhaler. Write the refill date on a calendar or your inhaler canister. Refill your inhaler 7-10 days before it runs out. Be sure to keep an adequate supply of medicine. This includes making sure it is not expired, and that you have a spare inhaler.  SEEK MEDICAL CARE IF:   Your symptoms are only partially relieved with your inhaler.  You are having trouble using your inhaler.  You have some increase in phlegm. SEEK IMMEDIATE MEDICAL CARE IF:    You feel little or no relief with your inhalers. You are still wheezing and are feeling shortness of breath or tightness in your chest or both.  You have dizziness, headaches, or a fast heart rate.  You have chills, fever, or night  sweats.  You have a noticeable increase in phlegm production, or there is blood in the phlegm. MAKE SURE YOU:   Understand these instructions.  Will watch your condition.  Will get help right away if you are not doing well or get worse. Document Released: 08/20/2000 Document Revised: 06/13/2013 Document Reviewed: 03/22/2013 [facility] Patient Information 2015 [place], [place]. This information is not intended to replace advice given to you by your health care provider. Make sure you discuss any questions you have with your health care provider.

## 2014-06-05 NOTE — ED Provider Notes (Signed)
Medical screening examination/treatment/procedure(s) were performed by non-physician practitioner and as supervising physician I was immediately available for consultation/collaboration.  Jerri Glauser, M.D.  Keisean Skowron C Irby Fails, MD 06/05/14 0757 

## 2014-08-15 ENCOUNTER — Inpatient Hospital Stay (HOSPITAL_COMMUNITY)
Admission: EM | Admit: 2014-08-15 | Discharge: 2014-08-19 | DRG: 481 | Disposition: A | Discharge status: Skilled Nursing Facility | Payer: Medicare Other | Attending: Family Medicine | Admitting: Family Medicine

## 2014-08-15 ENCOUNTER — Encounter (HOSPITAL_COMMUNITY): Payer: Self-pay | Admitting: Emergency Medicine

## 2014-08-15 ENCOUNTER — Emergency Department (HOSPITAL_COMMUNITY): Payer: Medicare Other

## 2014-08-15 DIAGNOSIS — W03XXXA Other fall on same level due to collision with another person, initial encounter: Secondary | ICD-10-CM | POA: Diagnosis present

## 2014-08-15 DIAGNOSIS — E440 Moderate protein-calorie malnutrition: Secondary | ICD-10-CM | POA: Insufficient documentation

## 2014-08-15 DIAGNOSIS — E785 Hyperlipidemia, unspecified: Secondary | ICD-10-CM | POA: Diagnosis present

## 2014-08-15 DIAGNOSIS — R911 Solitary pulmonary nodule: Secondary | ICD-10-CM | POA: Diagnosis present

## 2014-08-15 DIAGNOSIS — M79604 Pain in right leg: Secondary | ICD-10-CM | POA: Diagnosis present

## 2014-08-15 DIAGNOSIS — S7291XA Unspecified fracture of right femur, initial encounter for closed fracture: Secondary | ICD-10-CM

## 2014-08-15 DIAGNOSIS — I10 Essential (primary) hypertension: Secondary | ICD-10-CM | POA: Diagnosis not present

## 2014-08-15 DIAGNOSIS — Z8673 Personal history of transient ischemic attack (TIA), and cerebral infarction without residual deficits: Secondary | ICD-10-CM | POA: Diagnosis not present

## 2014-08-15 DIAGNOSIS — S72141A Displaced intertrochanteric fracture of right femur, initial encounter for closed fracture: Secondary | ICD-10-CM | POA: Diagnosis not present

## 2014-08-15 DIAGNOSIS — Y92129 Unspecified place in nursing home as the place of occurrence of the external cause: Secondary | ICD-10-CM | POA: Diagnosis not present

## 2014-08-15 DIAGNOSIS — W19XXXA Unspecified fall, initial encounter: Secondary | ICD-10-CM

## 2014-08-15 DIAGNOSIS — Z7982 Long term (current) use of aspirin: Secondary | ICD-10-CM | POA: Diagnosis not present

## 2014-08-15 DIAGNOSIS — Z681 Body mass index (BMI) 19 or less, adult: Secondary | ICD-10-CM

## 2014-08-15 DIAGNOSIS — Z720 Tobacco use: Secondary | ICD-10-CM | POA: Diagnosis not present

## 2014-08-15 DIAGNOSIS — S7290XA Unspecified fracture of unspecified femur, initial encounter for closed fracture: Secondary | ICD-10-CM | POA: Insufficient documentation

## 2014-08-15 DIAGNOSIS — T148XXA Other injury of unspecified body region, initial encounter: Secondary | ICD-10-CM

## 2014-08-15 DIAGNOSIS — Z01810 Encounter for preprocedural cardiovascular examination: Secondary | ICD-10-CM

## 2014-08-15 LAB — BASIC METABOLIC PANEL
ANION GAP: 13 (ref 5–15)
BUN: 15 mg/dL (ref 6–23)
CALCIUM: 9.6 mg/dL (ref 8.4–10.5)
CO2: 26 mEq/L (ref 19–32)
Chloride: 104 mEq/L (ref 96–112)
Creatinine, Ser: 0.92 mg/dL (ref 0.50–1.10)
GFR, EST AFRICAN AMERICAN: 69 mL/min — AB (ref >90)
GFR, EST NON AFRICAN AMERICAN: 59 mL/min — AB (ref >90)
Glucose, Bld: 98 mg/dL (ref 70–99)
Potassium: 4.6 mEq/L (ref 3.7–5.3)
SODIUM: 143 meq/L (ref 137–147)

## 2014-08-15 LAB — PROTIME-INR
INR: 0.96 (ref 0.00–1.49)
PROTHROMBIN TIME: 12.8 s (ref 11.6–15.2)

## 2014-08-15 LAB — CBC
HCT: 40.8 % (ref 36.0–46.0)
Hemoglobin: 14.1 g/dL (ref 12.0–15.0)
MCH: 28.8 pg (ref 26.0–34.0)
MCHC: 34.6 g/dL (ref 30.0–36.0)
MCV: 83.3 fL (ref 78.0–100.0)
PLATELETS: 183 10*3/uL (ref 150–400)
RBC: 4.9 MIL/uL (ref 3.87–5.11)
RDW: 14.5 % (ref 11.5–15.5)
WBC: 5.2 10*3/uL (ref 4.0–10.5)

## 2014-08-15 MED ORDER — HEPARIN SODIUM (PORCINE) 5000 UNIT/ML IJ SOLN
5000.0000 [IU] | Freq: Three times a day (TID) | INTRAMUSCULAR | Status: DC
  Administered 2014-08-16 – 2014-08-19 (×8): 5000 [IU] via SUBCUTANEOUS
  Filled 2014-08-15 (×13): qty 1

## 2014-08-15 MED ORDER — LOSARTAN POTASSIUM 50 MG PO TABS
50.0000 mg | ORAL_TABLET | Freq: Every day | ORAL | Status: DC
  Administered 2014-08-16 – 2014-08-19 (×4): 50 mg via ORAL
  Filled 2014-08-15 (×4): qty 1

## 2014-08-15 MED ORDER — PRAVASTATIN SODIUM 40 MG PO TABS
40.0000 mg | ORAL_TABLET | Freq: Every day | ORAL | Status: DC
  Administered 2014-08-17 – 2014-08-18 (×2): 40 mg via ORAL
  Filled 2014-08-15 (×4): qty 1

## 2014-08-15 MED ORDER — MORPHINE SULFATE 4 MG/ML IJ SOLN
4.0000 mg | Freq: Once | INTRAMUSCULAR | Status: AC
  Administered 2014-08-15: 4 mg via INTRAVENOUS
  Filled 2014-08-15: qty 1

## 2014-08-15 MED ORDER — MORPHINE SULFATE 2 MG/ML IJ SOLN: 0.5000 mg | INTRAMUSCULAR | Status: DC | PRN

## 2014-08-15 MED ORDER — ALBUTEROL SULFATE (2.5 MG/3ML) 0.083% IN NEBU: 2.5000 mg | INHALATION_SOLUTION | Freq: Four times a day (QID) | RESPIRATORY_TRACT | Status: DC | PRN

## 2014-08-15 MED ORDER — ONDANSETRON HCL 4 MG/2ML IJ SOLN
4.0000 mg | Freq: Once | INTRAMUSCULAR | Status: AC
  Administered 2014-08-15: 4 mg via INTRAVENOUS
  Filled 2014-08-15: qty 2

## 2014-08-15 MED ORDER — HYDROCODONE-ACETAMINOPHEN 5-325 MG PO TABS
1.0000 | ORAL_TABLET | Freq: Four times a day (QID) | ORAL | Status: DC | PRN
  Administered 2014-08-19: 1 via ORAL
  Administered 2014-08-19: 2 via ORAL
  Filled 2014-08-15: qty 2
  Filled 2014-08-15: qty 1

## 2014-08-15 MED ORDER — ASPIRIN EC 81 MG PO TBEC
81.0000 mg | DELAYED_RELEASE_TABLET | Freq: Every day | ORAL | Status: DC
  Filled 2014-08-15: qty 1

## 2014-08-15 NOTE — ED Notes (Signed)
[facility] reports a little over an hour ETA.
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
## 2014-08-15 NOTE — ED Notes (Signed)
MD at bedside. 
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
## 2014-08-15 NOTE — Consult Note (Signed)
I have reviewed the films and she will likely need IM nail of her right hip. [facility]  Please let me know if there are clearance issues  I will perform a full consult in the am   Kendra RanaMURPHY, Carmelia Tiner, D Cell: [phone]
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
## 2014-08-15 NOTE — ED Notes (Signed)
Pts pain 5/10. BP 172/90, Hr 92 and sats 100%
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
## 2014-08-16 ENCOUNTER — Inpatient Hospital Stay (HOSPITAL_COMMUNITY): Payer: Medicare Other

## 2014-08-16 ENCOUNTER — Inpatient Hospital Stay (HOSPITAL_COMMUNITY): Payer: Medicare Other | Admitting: Certified Registered"

## 2014-08-16 ENCOUNTER — Encounter (HOSPITAL_COMMUNITY): Payer: Self-pay | Admitting: Anesthesiology

## 2014-08-16 ENCOUNTER — Encounter (HOSPITAL_COMMUNITY)
Admission: EM | Disposition: A | Discharge status: Skilled Nursing Facility | Payer: Self-pay | Source: Home / Self Care | Attending: Family Medicine

## 2014-08-16 DIAGNOSIS — S72141S Displaced intertrochanteric fracture of right femur, sequela: Secondary | ICD-10-CM

## 2014-08-16 HISTORY — PX: FEMUR IM NAIL: SHX1597

## 2014-08-16 LAB — BASIC METABOLIC PANEL
ANION GAP: 14 (ref 5–15)
BUN: 13 mg/dL (ref 6–23)
CALCIUM: 8.8 mg/dL (ref 8.4–10.5)
CO2: 24 mEq/L (ref 19–32)
Chloride: 103 mEq/L (ref 96–112)
Creatinine, Ser: 0.81 mg/dL (ref 0.50–1.10)
GFR, EST AFRICAN AMERICAN: 80 mL/min — AB (ref >90)
GFR, EST NON AFRICAN AMERICAN: 69 mL/min — AB (ref >90)
GLUCOSE: 88 mg/dL (ref 70–99)
POTASSIUM: 4.3 meq/L (ref 3.7–5.3)
Sodium: 141 mEq/L (ref 137–147)

## 2014-08-16 LAB — APTT: APTT: 28 s (ref 24–37)

## 2014-08-16 LAB — MRSA PCR SCREENING: MRSA BY PCR: NEGATIVE

## 2014-08-16 SURGERY — INSERTION, INTRAMEDULLARY ROD, FEMUR
Anesthesia: General | Laterality: Right

## 2014-08-16 MED ORDER — HYDROCODONE-ACETAMINOPHEN 5-325 MG PO TABS: 1.0000 | ORAL_TABLET | ORAL | Status: DC | PRN

## 2014-08-16 MED ORDER — CEFAZOLIN SODIUM-DEXTROSE 2-3 GM-% IV SOLR
2.0000 g | Freq: Four times a day (QID) | INTRAVENOUS | Status: DC
  Filled 2014-08-16 (×2): qty 50

## 2014-08-16 MED ORDER — ASPIRIN EC 325 MG PO TBEC
325.0000 mg | DELAYED_RELEASE_TABLET | Freq: Every day | ORAL | Status: DC
  Administered 2014-08-17 – 2014-08-19 (×3): 325 mg via ORAL
  Filled 2014-08-16 (×4): qty 1

## 2014-08-16 MED ORDER — ASPIRIN EC 325 MG PO TBEC: 325.0000 mg | DELAYED_RELEASE_TABLET | Freq: Every day | ORAL | Status: DC

## 2014-08-16 MED ORDER — GLYCOPYRROLATE 0.2 MG/ML IJ SOLN
INTRAMUSCULAR | Status: AC
  Filled 2014-08-16: qty 4

## 2014-08-16 MED ORDER — NEOSTIGMINE METHYLSULFATE 10 MG/10ML IV SOLN
INTRAVENOUS | Status: DC | PRN
  Administered 2014-08-16: 3 mg via INTRAVENOUS

## 2014-08-16 MED ORDER — LACTATED RINGERS IV SOLN
INTRAVENOUS | Status: DC | PRN
  Administered 2014-08-16: 07:00:00 via INTRAVENOUS

## 2014-08-16 MED ORDER — PROPOFOL 10 MG/ML IV BOLUS
INTRAVENOUS | Status: AC
  Filled 2014-08-16: qty 20

## 2014-08-16 MED ORDER — NEOSTIGMINE METHYLSULFATE 10 MG/10ML IV SOLN
INTRAVENOUS | Status: AC
  Filled 2014-08-16: qty 1

## 2014-08-16 MED ORDER — GLYCOPYRROLATE 0.2 MG/ML IJ SOLN
INTRAMUSCULAR | Status: DC | PRN
  Administered 2014-08-16: 0.4 mg via INTRAVENOUS

## 2014-08-16 MED ORDER — FENTANYL CITRATE 0.05 MG/ML IJ SOLN
INTRAMUSCULAR | Status: DC | PRN
  Administered 2014-08-16 (×5): 50 ug via INTRAVENOUS

## 2014-08-16 MED ORDER — ONDANSETRON HCL 4 MG/2ML IJ SOLN
INTRAMUSCULAR | Status: DC | PRN
  Administered 2014-08-16: 4 mg via INTRAVENOUS

## 2014-08-16 MED ORDER — ROCURONIUM BROMIDE 50 MG/5ML IV SOLN
INTRAVENOUS | Status: AC
  Filled 2014-08-16: qty 1

## 2014-08-16 MED ORDER — FENTANYL CITRATE 0.05 MG/ML IJ SOLN
INTRAMUSCULAR | Status: AC
  Filled 2014-08-16: qty 5

## 2014-08-16 MED ORDER — HYDRALAZINE HCL 20 MG/ML IJ SOLN: 5.0000 mg | Freq: Four times a day (QID) | INTRAMUSCULAR | Status: DC | PRN

## 2014-08-16 MED ORDER — OXYCODONE HCL 5 MG PO TABS
5.0000 mg | ORAL_TABLET | Freq: Once | ORAL | Status: AC | PRN
  Administered 2014-08-16: 5 mg via ORAL

## 2014-08-16 MED ORDER — DEXTROSE 5 % IV SOLN
10.0000 mg | INTRAVENOUS | Status: DC | PRN
  Administered 2014-08-16: 50 ug/min via INTRAVENOUS

## 2014-08-16 MED ORDER — OXYCODONE HCL 5 MG/5ML PO SOLN: 5.0000 mg | Freq: Once | ORAL | Status: AC | PRN

## 2014-08-16 MED ORDER — ONDANSETRON HCL 4 MG/2ML IJ SOLN: 4.0000 mg | Freq: Once | INTRAMUSCULAR | Status: DC | PRN

## 2014-08-16 MED ORDER — ROCURONIUM BROMIDE 100 MG/10ML IV SOLN
INTRAVENOUS | Status: DC | PRN
  Administered 2014-08-16: 35 mg via INTRAVENOUS

## 2014-08-16 MED ORDER — ACETAMINOPHEN 650 MG RE SUPP: 650.0000 mg | Freq: Four times a day (QID) | RECTAL | Status: DC | PRN

## 2014-08-16 MED ORDER — PHENYLEPHRINE HCL 10 MG/ML IJ SOLN
INTRAMUSCULAR | Status: DC | PRN
  Administered 2014-08-16: 160 ug via INTRAVENOUS
  Administered 2014-08-16 (×2): 120 ug via INTRAVENOUS

## 2014-08-16 MED ORDER — MIDAZOLAM HCL 2 MG/2ML IJ SOLN
INTRAMUSCULAR | Status: AC
  Filled 2014-08-16: qty 2

## 2014-08-16 MED ORDER — BUPIVACAINE HCL (PF) 0.25 % IJ SOLN
INTRAMUSCULAR | Status: AC
  Filled 2014-08-16: qty 30

## 2014-08-16 MED ORDER — ACETAMINOPHEN 325 MG PO TABS
650.0000 mg | ORAL_TABLET | Freq: Four times a day (QID) | ORAL | Status: DC | PRN
  Administered 2014-08-18: 650 mg via ORAL
  Filled 2014-08-16: qty 2

## 2014-08-16 MED ORDER — PHENOL 1.4 % MT LIQD: 1.0000 | OROMUCOSAL | Status: DC | PRN

## 2014-08-16 MED ORDER — HYDROMORPHONE HCL 1 MG/ML IJ SOLN
0.2500 mg | INTRAMUSCULAR | Status: DC | PRN
  Administered 2014-08-16 (×2): 0.25 mg via INTRAVENOUS

## 2014-08-16 MED ORDER — CEFAZOLIN SODIUM-DEXTROSE 2-3 GM-% IV SOLR
2.0000 g | Freq: Three times a day (TID) | INTRAVENOUS | Status: AC
  Administered 2014-08-16 – 2014-08-17 (×3): 2 g via INTRAVENOUS
  Filled 2014-08-16 (×4): qty 50

## 2014-08-16 MED ORDER — OXYCODONE HCL 5 MG PO TABS
ORAL_TABLET | ORAL | Status: AC
  Administered 2014-08-16: 5 mg via ORAL
  Filled 2014-08-16: qty 1

## 2014-08-16 MED ORDER — HYDROMORPHONE HCL 1 MG/ML IJ SOLN
INTRAMUSCULAR | Status: AC
  Administered 2014-08-16: 0.25 mg via INTRAVENOUS
  Filled 2014-08-16: qty 1

## 2014-08-16 MED ORDER — 0.9 % SODIUM CHLORIDE (POUR BTL) OPTIME
TOPICAL | Status: DC | PRN
  Administered 2014-08-16: 1000 mL

## 2014-08-16 MED ORDER — ARTIFICIAL TEARS OP OINT
TOPICAL_OINTMENT | OPHTHALMIC | Status: DC | PRN
  Administered 2014-08-16: 1 via OPHTHALMIC

## 2014-08-16 MED ORDER — BOOST / RESOURCE BREEZE PO LIQD
1.0000 | Freq: Three times a day (TID) | ORAL | Status: DC
  Administered 2014-08-16 – 2014-08-19 (×8): 1 via ORAL

## 2014-08-16 MED ORDER — DOCUSATE SODIUM 100 MG PO CAPS: 100.0000 mg | ORAL_CAPSULE | Freq: Two times a day (BID) | ORAL | Status: DC

## 2014-08-16 MED ORDER — LIDOCAINE HCL (CARDIAC) 20 MG/ML IV SOLN
INTRAVENOUS | Status: DC | PRN
  Administered 2014-08-16: 100 mg via INTRAVENOUS

## 2014-08-16 MED ORDER — CEFAZOLIN SODIUM-DEXTROSE 2-3 GM-% IV SOLR
2.0000 g | Freq: Three times a day (TID) | INTRAVENOUS | Status: DC
  Administered 2014-08-16: 2 g via INTRAVENOUS

## 2014-08-16 MED ORDER — MENTHOL 3 MG MT LOZG
1.0000 | LOZENGE | OROMUCOSAL | Status: DC | PRN
Start: 2014-08-16 — End: 2014-08-19

## 2014-08-16 MED ORDER — MEPERIDINE HCL 25 MG/ML IJ SOLN: 6.2500 mg | INTRAMUSCULAR | Status: DC | PRN

## 2014-08-16 MED ORDER — PROPOFOL 10 MG/ML IV BOLUS
INTRAVENOUS | Status: DC | PRN
  Administered 2014-08-16: 120 mg via INTRAVENOUS

## 2014-08-16 MED ORDER — CEFAZOLIN SODIUM-DEXTROSE 2-3 GM-% IV SOLR
INTRAVENOUS | Status: AC
Start: 2014-08-16 — End: 2014-08-16
  Filled 2014-08-16: qty 50

## 2014-08-16 MED ORDER — MIDAZOLAM HCL 5 MG/5ML IJ SOLN
INTRAMUSCULAR | Status: DC | PRN
  Administered 2014-08-16: 1 mg via INTRAVENOUS

## 2014-08-16 SURGICAL SUPPLY — 42 items
CLOSURE STERI-STRIP 1/2X4 (GAUZE/BANDAGES/DRESSINGS) ×1
CLSR STERI-STRIP ANTIMIC 1/2X4 (GAUZE/BANDAGES/DRESSINGS) ×2 IMPLANT
COVER PERINEAL POST (MISCELLANEOUS) ×3 IMPLANT
COVER SURGICAL LIGHT HANDLE (MISCELLANEOUS) ×3 IMPLANT
DRAPE STERI IOBAN 125X83 (DRAPES) ×3 IMPLANT
DRSG EMULSION OIL 3X3 NADH (GAUZE/BANDAGES/DRESSINGS) IMPLANT
DRSG MEPILEX BORDER 4X4 (GAUZE/BANDAGES/DRESSINGS) ×6 IMPLANT
DRSG TEGADERM 4X4.75 (GAUZE/BANDAGES/DRESSINGS) IMPLANT
DURAPREP 26ML APPLICATOR (WOUND CARE) ×3 IMPLANT
ELECT REM PT RETURN 9FT ADLT (ELECTROSURGICAL) ×3
ELECTRODE REM PT RTRN 9FT ADLT (ELECTROSURGICAL) ×1 IMPLANT
GAUZE SPONGE 4X4 12PLY STRL (GAUZE/BANDAGES/DRESSINGS) IMPLANT
GLOVE BIO SURGEON STRL SZ 6.5 (GLOVE) ×2 IMPLANT
GLOVE BIO SURGEON STRL SZ7.5 (GLOVE) ×3 IMPLANT
GLOVE BIO SURGEONS STRL SZ 6.5 (GLOVE) ×1
GLOVE BIOGEL PI IND STRL 6.5 (GLOVE) ×1 IMPLANT
GLOVE BIOGEL PI IND STRL 8 (GLOVE) ×1 IMPLANT
GLOVE BIOGEL PI INDICATOR 6.5 (GLOVE) ×2
GLOVE BIOGEL PI INDICATOR 8 (GLOVE) ×2
GOWN STRL REUS W/ TWL LRG LVL3 (GOWN DISPOSABLE) ×2 IMPLANT
GOWN STRL REUS W/TWL LRG LVL3 (GOWN DISPOSABLE) ×4
GUIDEROD T2 3X1000 (ROD) ×3 IMPLANT
K-WIRE  3.2X450M STR (WIRE) ×2
K-WIRE 3.2X450M STR (WIRE) ×1
KIT BASIN OR (CUSTOM PROCEDURE TRAY) ×3 IMPLANT
KIT NAIL LONG 10X380MMX125 (Nail) ×3 IMPLANT
KIT ROOM TURNOVER OR (KITS) ×3 IMPLANT
KWIRE 3.2X450M STR (WIRE) ×1 IMPLANT
LINER BOOT UNIVERSAL DISP (MISCELLANEOUS) IMPLANT
MANIFOLD NEPTUNE II (INSTRUMENTS) IMPLANT
NS IRRIG 1000ML POUR BTL (IV SOLUTION) ×3 IMPLANT
PACK GENERAL/GYN (CUSTOM PROCEDURE TRAY) ×3 IMPLANT
PAD ARMBOARD 7.5X6 YLW CONV (MISCELLANEOUS) ×6 IMPLANT
SCREW LAG GAMMA 3 95MM (Screw) ×3 IMPLANT
STAPLER VISISTAT 35W (STAPLE) ×3 IMPLANT
SUT MON AB 2-0 CT1 36 (SUTURE) ×3 IMPLANT
SUT VIC AB 0 CT1 27 (SUTURE) ×2
SUT VIC AB 0 CT1 27XBRD ANBCTR (SUTURE) ×1 IMPLANT
TOWEL OR 17X24 6PK STRL BLUE (TOWEL DISPOSABLE) ×3 IMPLANT
TOWEL OR 17X26 10 PK STRL BLUE (TOWEL DISPOSABLE) ×3 IMPLANT
TOWEL OR NON WOVEN STRL DISP B (DISPOSABLE) ×3 IMPLANT
WATER STERILE IRR 1000ML POUR (IV SOLUTION) IMPLANT

## 2014-08-16 NOTE — Anesthesia Preprocedure Evaluation (Addendum)
Anesthesia Evaluation  Patient identified by MRN, date of birth, ID band Patient awake    Reviewed: Allergy & Precautions, H&P , NPO status , Patient's Chart, lab work & pertinent test results  Airway Mallampati: I  TM Distance: >3 FB Neck ROM: Full    Dental   Pulmonary former smoker,          Cardiovascular hypertension, Pt. on medications     Neuro/Psych CVA    GI/Hepatic   Endo/Other    Renal/GU      Musculoskeletal   Abdominal   Peds  Hematology   Anesthesia Other Findings   Reproductive/Obstetrics                            Anesthesia Physical Anesthesia Plan  ASA: II  Anesthesia Plan: General   Post-op Pain Management:    Induction: Intravenous  Airway Management Planned: Oral ETT  Additional Equipment:   Intra-op Plan:   Post-operative Plan: Extubation in OR  Informed Consent: I have reviewed the patients History and Physical, chart, labs and discussed the procedure including the risks, benefits and alternatives for the proposed anesthesia with the patient or authorized representative who has indicated his/her understanding and acceptance.   Dental advisory given  Plan Discussed with: Surgeon and CRNA  Anesthesia Plan Comments:        Anesthesia Quick Evaluation

## 2014-08-16 NOTE — Discharge Instructions (Signed)
Keep you incisions dry and covered until follow up  Bear weight as tolerated  Take ASA 325 daily for 30 days then go back to a baby aspirin
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
## 2014-08-16 NOTE — Anesthesia Procedure Notes (Signed)
Procedure Name: Intubation Date/Time: 08/16/2014 7:27 AM Performed by: Leonel Ramsay'LAUGHLIN, Tamila Gaulin H Pre-anesthesia Checklist: Patient identified, Timeout performed, Emergency Drugs available, Suction available and Patient being monitored Patient Re-evaluated:Patient Re-evaluated prior to inductionOxygen Delivery Method: Circle system utilized Preoxygenation: Pre-oxygenation with 100% oxygen Intubation Type: IV induction Ventilation: Mask ventilation without difficulty Laryngoscope Size: Mac and 3 Grade View: Grade I Tube type: Oral Tube size: 7.0 mm Number of attempts: 1 Airway Equipment and Method: Stylet Placement Confirmation: ETT inserted through vocal cords under direct vision,  positive ETCO2 and breath sounds checked- equal and bilateral Secured at: 20 cm Tube secured with: Tape Dental Injury: Teeth and Oropharynx as per pre-operative assessment
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
## 2014-08-16 NOTE — Anesthesia Postprocedure Evaluation (Signed)
Anesthesia Post Note  Patient: Kendra Nixon  Procedure(s) Performed: Procedure(s) (LRB): INTRAMEDULLARY (IM) NAIL FEMORAL (Right)  Anesthesia type: general  Patient location: PACU  Post pain: Pain level controlled  Post assessment: Patient's Cardiovascular Status Stable  Last Vitals:  Filed Vitals:   08/16/14 0959  BP: 195/75  Pulse: 52  Temp: 36.7 C  Resp: 14    Post vital signs: Reviewed and stable  Level of consciousness: sedated  Complications: No apparent anesthesia complications

## 2014-08-16 NOTE — Progress Notes (Signed)
Orthopedic Tech Progress Note Patient Details:  Windell Mouldingunice Buchbinder October 14, 1940 [id number] Patient already has overhead frame on bed Patient ID: Windell Mouldingunice Faries, female   DOB: October 14, 1940, 73 y.o.   MRN: [id number]   Jennye MoccasinHughes, Wende Longstreth Craig 08/16/2014, 10:19 AM

## 2014-08-16 NOTE — Interval H&P Note (Signed)
History and Physical Interval Note:  08/16/2014 6:52 AM  Kendra MouldingEunice Hyneman  has presented today for surgery, with the diagnosis of Right femur fracture  The various methods of treatment have been discussed with the patient and family. After consideration of risks, benefits and other options for treatment, the patient has consented to  Procedure(s): INTRAMEDULLARY (IM) NAIL FEMORAL (Right) as a surgical intervention .  The patient's history has been reviewed, patient examined, no change in status, stable for surgery.  I have reviewed the patient's chart and labs.  Questions were answered to the patient's satisfaction.     Kendra Nixon, D

## 2014-08-16 NOTE — Transfer of Care (Signed)
Immediate Anesthesia Transfer of Care Note  Patient: Kendra Nixon  Procedure(s) Performed: Procedure(s): INTRAMEDULLARY (IM) NAIL FEMORAL (Right)  Patient Location: PACU  Anesthesia Type:General  Level of Consciousness: awake, alert  and oriented  Airway & Oxygen Therapy: Patient Spontanous Breathing and Patient connected to nasal cannula oxygen  Post-op Assessment: Report given to PACU RN and Post -op Vital signs reviewed and stable  Post vital signs: Reviewed and stable  Complications: No apparent anesthesia complications
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
## 2014-08-17 DIAGNOSIS — E440 Moderate protein-calorie malnutrition: Secondary | ICD-10-CM | POA: Insufficient documentation
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
## 2014-08-17 NOTE — Progress Notes (Signed)
Subjective: 1 Day Post-Op Procedure(s) (LRB): INTRAMEDULLARY (IM) NAIL FEMORAL (Right) Patient reports pain as 1 on 0-10 scale.  Patient feeling a little lightheaded this am.  No nausea/vomiting.  Tolerating diet.  Objective: Vital signs in last 24 hours: Temp:  [97.9 F (36.6 C)-98.2 F (36.8 C)] 97.9 F (36.6 C) (12/12 0553) Pulse Rate:  [52-67] 57 (12/12 0553) Resp:  [14] 14 (12/12 0553) BP: (152-195)/(75-82) 164/78 mmHg (12/12 0553) SpO2:  [96 %-100 %] 100 % (12/12 0553)  Intake/Output from previous day: 12/11 0701 - 12/12 0700 In: 840 [P.O.:140; I.V.:700] Out: 150 [Blood:150] Intake/Output this shift: Total I/O In: 240 [P.O.:240] Out: -    Recent Labs  08/15/14 1643  HGB 14.1    Recent Labs  08/15/14 1643  WBC 5.2  RBC 4.90  HCT 40.8  PLT 183    Recent Labs  08/15/14 1643 08/15/14 2350  NA 143 141  K 4.6 4.3  CL 104 103  CO2 26 24  BUN 15 13  CREATININE 0.92 0.81  GLUCOSE 98 88  CALCIUM 9.6 8.8    Recent Labs  08/15/14 1643  INR 0.96    Neurologically intact Neurovascular intact Sensation intact distally Intact pulses distally Dorsiflexion/Plantar flexion intact Compartment soft  Negative homans bilaterally  Assessment/Plan: 1 Day Post-Op Procedure(s) (LRB): INTRAMEDULLARY (IM) NAIL FEMORAL (Right) Advance diet Up with therapy  WBAT LLE Dry dressing change prn Continue plan per medicine  ANTON, M. LINDSEY 08/17/2014, 9:17 AM

## 2014-08-17 NOTE — Progress Notes (Signed)
[facility] PROGRESS NOTE  Kendra Nixon WJX:[id number] DOB: 1941-08-04 DOA: 08/15/2014 PCP: Ron ParkerJENKINS,HARVETTE C, MD  Assessment/Plan: Active Problems:     Fracture, intertrochanteric, right femur - Ortho on board - Continue supportive therapy  Pulmonary nodule - Patient to continue routine monitoring after discharge     Hyperlipidemia - Stable continue statin and aspirin    Essential hypertension - We'll add hydralazine when necessary -Continue losartan - Not well controlled today as such had to add hydralazine     Tobacco use - Will continue to encourage cessation  Code Status: Full Family Communication: No family at bedside  Disposition Plan: Pending recommendations from PT and orthopedic surgeons   Consultants:  Orthopedic surgery  Procedures: 1 Day Post-Op Procedure(s) (LRB): INTRAMEDULLARY (IM) NAIL FEMORAL (Right) Antibiotics:  None  HPI/Subjective: No new complaints. No acute issues overnight reported.  Objective: Filed Vitals:   08/17/14 1300  BP: 134/79  Pulse: 64  Temp:   Resp: 16    Intake/Output Summary (Last 24 hours) at 08/17/14 1609 Last data filed at 08/17/14 1300  Gross per 24 hour  Intake    720 ml  Output      0 ml  Net    720 ml   There were no vitals filed for this visit.  Exam:   General:  Patient in no acute distress, alert and awake  Cardiovascular: Regular rate and rhythm, no murmurs rubs  Respiratory: Clear to auscultation bilaterally, no wheezes  Abdomen: Soft, nondistended, nontender  Musculoskeletal: No clubbing   Data Reviewed: Basic Metabolic Panel:  Recent Labs Lab 08/15/14 1643 08/15/14 2350  NA 143 141  K 4.6 4.3  CL 104 103  CO2 26 24  GLUCOSE 98 88  BUN 15 13  CREATININE 0.92 0.81  CALCIUM 9.6 8.8   Liver Function Tests: No results for input(s): AST, ALT, ALKPHOS, BILITOT, PROT, ALBUMIN in the last 168 hours. No results for input(s): LIPASE, AMYLASE in the last 168 hours. No  results for input(s): AMMONIA in the last 168 hours. CBC:  Recent Labs Lab 08/15/14 1643  WBC 5.2  HGB 14.1  HCT 40.8  MCV 83.3  PLT 183   Cardiac Enzymes: No results for input(s): CKTOTAL, CKMB, CKMBINDEX, TROPONINI in the last 168 hours. BNP (last 3 results) No results for input(s): PROBNP in the last 8760 hours. CBG: No results for input(s): GLUCAP in the last 168 hours.  Recent Results (from the past 240 hour(s))  MRSA PCR Screening     Status: None   Collection Time: 08/16/14  1:26 AM  Result Value Ref Range Status   MRSA by PCR NEGATIVE NEGATIVE Final    Comment:        The GeneXpert MRSA Assay (FDA approved for NASAL specimens only), is one component of a comprehensive MRSA colonization surveillance program. It is not intended to diagnose MRSA infection nor to guide or monitor treatment for MRSA infections.      Studies: Dg Hip Operative Right  08/16/2014   CLINICAL DATA:  73 year old for open reduction internal fixation for a right hip fracture  EXAM: DG OPERATIVE RIGHT HIP 1-2 VIEWS  FLUOROSCOPY TIME:  24 seconds.  TECHNIQUE: Fluoroscopic spot image(s) were submitted for interpretation post-operatively.  COMPARISON:  08/15/2014  FINDINGS: Three fluoroscopic images demonstrate interval placement of a intramedullary rod and screw device involving traversing the right femur. Hardware appears radiographically intact. There is no dislocation.  IMPRESSION: Open reduction internal fixation of a right hip fracture as above.  Electronically Signed   By: Fannie KneeKenneth  Crosby   On: 08/16/2014 09:02   Hip Portable 1 View Right  08/16/2014   CLINICAL DATA:  Status post right hip ORIF.  EXAM: PORTABLE RIGHT HIP - 1 VIEW  COMPARISON:  None.  FINDINGS: There is an intra medullary rod and hip screw which reduces the intertrochanteric fracture of the proximal right femur. The distal portion of the medullary rod is not visualized. The hardware components are in anatomic alignment. No  complication.  IMPRESSION: 1. Status post ORIF of the right proximal femur. 2. Nonvisualization of the distal portion of the medullary rod. Consider further evaluation with complete right femur imaging.   Electronically Signed   By: Signa Kellaylor  Stroud M.D.   On: 08/16/2014 09:54    Scheduled Meds: . aspirin EC  325 mg Oral Q breakfast  . feeding supplement (RESOURCE BREEZE)  1 Container Oral TID BM  . heparin  5,000 Units Subcutaneous 3 times per day  . losartan  50 mg Oral Daily  . pravastatin  40 mg Oral q1800   Continuous Infusions:    Time spent: > 35 minutes    Penny PiaVEGA, Martice Doty  [facility] Pager [phone]. If 7PM-7AM, please contact night-coverage at [email or web address], password [email or web address] 08/17/2014, 4:09 PM  LOS: 2 days

## 2014-08-17 NOTE — Progress Notes (Signed)
Physical Therapy Evaluation Patient Details Name: Kendra Nixon MRN: [id number] DOB: 1941-09-05 Today's Date: 08/17/2014   History of Present Illness  73 yo female post fall and R hip fracture on 08/15/14, underwent ORIF for IM nail on 08/16/14.  Clinical Impression  Patient presents with less pain than anticipated, still requires MIN to MOD assist with mobility and short distance gait.  Greatest difficulty with R hip abduction during pivot transfer.  General safety cues for hand placement with RW device.  Depending on patient progress, will benefit from additional therapy, to be determined if CIR is appropriate vs. SNF vs. Home to daughter's house with Home Health (need to verify if this is really an option).  Will continue to follow on skilled services while in hospital.     Follow Up Recommendations CIR;SNF;Other (comment) (Pending progress, possibly home (to daughter) with Home Heal)    Equipment Recommendations  Rolling walker with 5" wheels    Recommendations for Other Services Rehab consult;OT consult     Precautions / Restrictions Precautions Precautions: Fall Restrictions Weight Bearing Restrictions: No RLE Weight Bearing: Weight bearing as tolerated      Mobility  Bed Mobility Overal bed mobility: Needs Assistance Bed Mobility: Rolling;Supine to Sit Rolling: Min assist   Supine to sit: Min assist     General bed mobility comments: Slower pace  Transfers Overall transfer level: Needs assistance Equipment used: Rolling walker (2 wheeled) Transfers: Sit to/from UGI CorporationStand;Stand Pivot Transfers Sit to Stand: Min assist Stand pivot transfers: Mod assist       General transfer comment: Difficulty abducting R LE, with pivot turn.   Ambulation/Gait Ambulation/Gait assistance: Min assist;Mod assist Ambulation Distance (Feet): 10 Feet Assistive device: Rolling walker (2 wheeled) Gait Pattern/deviations: Step-to pattern;Decreased weight shift to right;Antalgic   Gait  velocity interpretation: Below normal speed for age/gender    Stairs            Wheelchair Mobility    Modified Rankin (Stroke Patients Only)       Balance Overall balance assessment: Needs assistance Sitting-balance support: Single extremity supported Sitting balance-Leahy Scale: Fair     Standing balance support: Bilateral upper extremity supported Standing balance-Leahy Scale: Poor                               Pertinent Vitals/Pain Pain Assessment: Faces Faces Pain Scale: Hurts little more Pain Location: R hip Pain Descriptors / Indicators: Penetrating;Sharp;Tender Pain Intervention(s): Limited activity within patient's tolerance;Monitored during session;Repositioned    Home Living Family/patient expects to be discharged to:: Private residence (Skilled nursing vs home with daughter depending on progress.) Living Arrangements: Children Available Help at Discharge: Available PRN/intermittently;Personal care attendant (Aide available 3 hours/day) Type of Home: Apartment Home Access: Elevator     Home Layout: One level Home Equipment: Grab bars - tub/shower;Cane - single point Additional Comments: Uses cane at times.    Prior Function Level of Independence: Independent with assistive device(s)         Comments: Care giver assistance 3hrs/day     Hand Dominance        Extremity/Trunk Assessment   Upper Extremity Assessment: Overall WFL for tasks assessed           Lower Extremity Assessment: RLE deficits/detail RLE Deficits / Details: Weak  and antalgic with Weight bearing tasks       Communication   Communication: No difficulties  Cognition Arousal/Alertness: Awake/alert Behavior During Therapy: WFL for tasks assessed/performed  Overall Cognitive Status: Within Functional Limits for tasks assessed                      General Comments      Exercises General Exercises - Lower Extremity Heel Slides: AAROM;Both;10  reps;Supine Hip ABduction/ADduction: AAROM;Both;10 reps;Supine      Assessment/Plan    PT Assessment Patient needs continued PT services  PT Diagnosis Difficulty walking;Acute pain   PT Problem List Decreased strength;Decreased activity tolerance;Decreased mobility;Decreased balance;Pain  PT Treatment Interventions Gait training;Functional mobility training;Therapeutic activities;Therapeutic exercise;Patient/family education   PT Goals (Current goals can be found in the Care Plan section) Acute Rehab PT Goals Patient Stated Goal: To go home PT Goal Formulation: With patient Time For Goal Achievement: 08/31/14 Potential to Achieve Goals: Good    Frequency Min 4X/week   Barriers to discharge Decreased caregiver support Patient reports possibly going to stay with daughter in her home after hospital at first.    Co-evaluation               End of Session Equipment Utilized During Treatment: Gait belt Activity Tolerance: Patient limited by pain;Patient limited by fatigue Patient left: in chair;with call bell/phone within reach;with family/visitor present Nurse Communication: Mobility status         Time: [id number] PT Time Calculation (min) (ACUTE ONLY): 35 min   Charges:   PT Evaluation $Initial PT Evaluation Tier I: 1 Procedure PT Treatments $Therapeutic Activity: 8-22 mins   PT G Codes:          Perlie Scheuring L 08/17/2014, 2:10 PM

## 2014-08-18 MED ORDER — POLYETHYLENE GLYCOL 3350 17 G PO PACK
17.0000 g | PACK | Freq: Every day | ORAL | Status: DC
  Administered 2014-08-18 – 2014-08-19 (×2): 17 g via ORAL
  Filled 2014-08-18 (×2): qty 1

## 2014-08-18 NOTE — Progress Notes (Signed)
Occupational Therapy Evaluation Patient Details Name: Kendra Nixon MRN: [id number] DOB: February 12, 1941 Today's Date: 08/18/2014    History of Present Illness 73 yo female post fall and R hip fracture on 08/15/14, underwent ORIF for IM nail on 08/16/14.   Clinical Impression   PTA pt lived at home alone and was independent with use of cane for ADLs. Pt reports PCA came for 3 hours/day and assisted with IADLs and occasionally with ADLs. Pt hopes to d/c home with her family, however feel that she is unsafe to d/c home and would benefit from intensive therapy to enable her to return home with 24/7 supervision. Pt will benefit from acute OT to address functional mobility and LB ADLs.     Follow Up Recommendations  CIR;Supervision/Assistance - 24 hour    Equipment Recommendations  3 in 1 bedside comode    Recommendations for Other Services       Precautions / Restrictions Precautions Precautions: Fall Restrictions Weight Bearing Restrictions: Yes RLE Weight Bearing: Weight bearing as tolerated      Mobility Bed Mobility Overal bed mobility: Needs Assistance Bed Mobility: Supine to Sit;Sit to Supine     Supine to sit: Min assist Sit to supine: Min assist   General bed mobility comments: VC's for technique. HOB flat. No use of rails to simulate home environment. Min (A) hand held assist and very slow movement.   Transfers Overall transfer level: Needs assistance Equipment used: Rolling walker (2 wheeled) Transfers: Sit to/from Stand Sit to Stand: Min assist         General transfer comment: Min (A) to power up from bed on lowest level. VC's for hand placement and sequencing.          ADL Overall ADL's : Needs assistance/impaired Eating/Feeding: Independent;Sitting   Grooming: Set up;Sitting   Upper Body Bathing: Set up;Sitting   Lower Body Bathing: Sit to/from stand;Minimal assistance   Upper Body Dressing : Set up;Sitting   Lower Body Dressing: Moderate  assistance;Sit to/from stand Lower Body Dressing Details (indicate cue type and reason): Pt able to sit EOB and adjust L sock by bringing leg up to EOB. Required assistance with RLE.  Toilet Transfer: Minimal assistance;Ambulation;RW Toilet Transfer Details (indicate cue type and reason): sit<>stand from bed and took 2 steps forward and 2 steps backward         Functional mobility during ADLs: Minimal assistance;Rolling walker General ADL Comments: Pt doing well this afternoon and able to get OOB with min (A) for bed mobility and sit<>stand, however significant increased time for movement. Pt feels that she would do better at home in familiar enviroment. Discussed safety concerns at length with pt and she reports concern that she will have to pay for Rehab. Explained process for CIR Vs. SNF. Pt states she will think about this.      Vision  Pt reports no change from baseline.                    Perception Perception Perception Tested?: No   Praxis Praxis Praxis tested?: Within functional limits    Pertinent Vitals/Pain Pain Assessment: No/denies pain Faces Pain Scale: Hurts even more Pain Location: R hip Pain Descriptors / Indicators: Grimacing Pain Intervention(s): Limited activity within patient's tolerance;Monitored during session;Repositioned     Hand Dominance Left   Extremity/Trunk Assessment Upper Extremity Assessment Upper Extremity Assessment: Overall WFL for tasks assessed   Lower Extremity Assessment Lower Extremity Assessment: Defer to PT evaluation   Cervical / Trunk  Assessment Cervical / Trunk Assessment: Normal   Communication Communication Communication: No difficulties   Cognition Arousal/Alertness: Awake/alert Behavior During Therapy: WFL for tasks assessed/performed Overall Cognitive Status: Within Functional Limits for tasks assessed                                Home Living Family/patient expects to be discharged to:: Private  residence Living Arrangements: Children (plans to stay with daughter for 2 weeks) Available Help at Discharge: Available PRN/intermittently;Personal care attendant (aide for 3 hours/day) Type of Home: Apartment Home Access: Elevator     Home Layout: One level     Bathroom Shower/Tub: Chief Strategy OfficerTub/shower unit   Bathroom Toilet: Standard     Home Equipment: Grab bars - tub/shower;Cane - single point   Additional Comments: Uses cane at times.      Prior Functioning/Environment Level of Independence: Independent with assistive device(s)        Comments: PCA assists with IADLs and pt reports occasional assist with ADLs    OT Diagnosis: Generalized weakness;Acute pain   OT Problem List: Decreased strength;Decreased range of motion;Decreased activity tolerance;Impaired balance (sitting and/or standing);Decreased safety awareness;Decreased knowledge of use of DME or AE;Decreased knowledge of precautions;Pain   OT Treatment/Interventions: Self-care/ADL training;Therapeutic exercise;Energy conservation;DME and/or AE instruction;Therapeutic activities;Patient/family education;Balance training    OT Goals(Current goals can be found in the care plan section) Acute Rehab OT Goals Patient Stated Goal: To go home and not fall OT Goal Formulation: With patient Time For Goal Achievement: 09/01/14 Potential to Achieve Goals: Good ADL Goals Pt Will Perform Grooming: standing;with min guard assist Pt Will Perform Lower Body Bathing: with set-up;with supervision;sit to/from stand Pt Will Perform Lower Body Dressing: with set-up;with supervision;sit to/from stand Pt Will Transfer to Toilet: with supervision;ambulating;bedside commode Pt Will Perform Toileting - Clothing Manipulation and hygiene: with supervision;sit to/from stand Pt Will Perform Tub/Shower Transfer: with supervision;ambulating;rolling walker;3 in 1 Additional ADL Goal #1: Pt will perform bed mobility with Supervision to prepare for  ADLs.   OT Frequency: Min 2X/week    End of Session Equipment Utilized During Treatment: Rolling walker;Gait belt  Activity Tolerance: Patient tolerated treatment well Patient left: in bed;with call bell/phone within reach   Time: 1556-1626 OT Time Calculation (min): 30 min Charges:  OT General Charges $OT Visit: 1 Procedure OT Evaluation $Initial OT Evaluation Tier I: 1 Procedure OT Treatments $Self Care/Home Management : 23-37 mins  Rae LipsMiller, Jekhi Bolin M 08/18/2014, 4:36 PM   Yehuda MaoLeeAnn Guido SanderMarie Bradden Tadros, OTR/L Occupational Therapist [phone] (pager)

## 2014-08-18 NOTE — Progress Notes (Signed)
Subjective: 2 Days Post-Op Procedure(s) (LRB): INTRAMEDULLARY (IM) NAIL FEMORAL (Right) Patient reports pain as 1 on 0-10 scale.  No nausea/vomiting, lightheadedness/dizziness, chest pain/sob.  Tolerating diet.  Objective: Vital signs in last 24 hours: Temp:  [98.3 F (36.8 C)] 98.3 F (36.8 C) (12/13 0607) Pulse Rate:  [60-71] 60 (12/13 0607) Resp:  [16] 16 (12/13 0607) BP: (134-157)/(75-79) 157/75 mmHg (12/13 0607) SpO2:  [100 %] 100 % (12/13 0607)  Intake/Output from previous day: 12/12 0701 - 12/13 0700 In: 720 [P.O.:720] Out: -  Intake/Output this shift:     Recent Labs  08/15/14 1643  HGB 14.1    Recent Labs  08/15/14 1643  WBC 5.2  RBC 4.90  HCT 40.8  PLT 183    Recent Labs  08/15/14 1643 08/15/14 2350  NA 143 141  K 4.6 4.3  CL 104 103  CO2 26 24  BUN 15 13  CREATININE 0.92 0.81  GLUCOSE 98 88  CALCIUM 9.6 8.8    Recent Labs  08/15/14 1643  INR 0.96    Neurologically intact Neurovascular intact Sensation intact distally Intact pulses distally Dorsiflexion/Plantar flexion intact Incision: scant drainage No cellulitis present Compartment soft Dressing changed by me today Negative homans bilaterally   Assessment/Plan: 2 Days Post-Op Procedure(s) (LRB): INTRAMEDULLARY (IM) NAIL FEMORAL (Right) Advance diet Up with therapy  WBAT LLE Dry dressing change prn Continue plan per medicine  ANTON, M. LINDSEY 08/18/2014, 8:45 AM

## 2014-08-18 NOTE — Progress Notes (Signed)
Patient's daughter, Gareth MorganMichelle Gladney, would like the social worker to contact her at [phone] to discuss discharge and SNF options.

## 2014-08-18 NOTE — Progress Notes (Signed)
[facility] PROGRESS NOTE  Windell Mouldingunice Bower ZOX:[id number] DOB: 01/09/1941 DOA: 08/15/2014 PCP: Ron ParkerJENKINS,HARVETTE C, MD  Assessment/Plan: Active Problems:     Fracture, intertrochanteric, right femur - Ortho on board - Continue supportive therapy - PT for further recommendations.  Pulmonary nodule - Patient to continue routine monitoring after discharge    Hyperlipidemia - Stable continue statin and aspirin    Essential hypertension - We'll add hydralazine when necessary -Continue losartan - Not well controlled today as such had to add hydralazine    Tobacco use - Will continue to encourage cessation  Code Status: Full Family Communication: No family at bedside  Disposition Plan: Pending recommendations from PT and orthopedic surgeons   Consultants:  Orthopedic surgery  Procedures: 1 Day Post-Op Procedure(s) (LRB): INTRAMEDULLARY (IM) NAIL FEMORAL (Right) Antibiotics:  None  HPI/Subjective: No new complaints.  Objective: Filed Vitals:   08/18/14 0607  BP: 157/75  Pulse: 60  Temp: 98.3 F (36.8 C)  Resp: 16    Intake/Output Summary (Last 24 hours) at 08/18/14 1356 Last data filed at 08/17/14 1730  Gross per 24 hour  Intake    120 ml  Output      0 ml  Net    120 ml   There were no vitals filed for this visit.  Exam:   General:  Patient in no acute distress, alert and awake  Cardiovascular: Regular rate and rhythm, no murmurs rubs  Respiratory: Clear to auscultation bilaterally, no wheezes  Abdomen: Soft, nondistended, nontender  Musculoskeletal: No clubbing   Data Reviewed: Basic Metabolic Panel:  Recent Labs Lab 08/15/14 1643 08/15/14 2350  NA 143 141  K 4.6 4.3  CL 104 103  CO2 26 24  GLUCOSE 98 88  BUN 15 13  CREATININE 0.92 0.81  CALCIUM 9.6 8.8   Liver Function Tests: No results for input(s): AST, ALT, ALKPHOS, BILITOT, PROT, ALBUMIN in the last 168 hours. No results for input(s): LIPASE, AMYLASE in the last 168  hours. No results for input(s): AMMONIA in the last 168 hours. CBC:  Recent Labs Lab 08/15/14 1643  WBC 5.2  HGB 14.1  HCT 40.8  MCV 83.3  PLT 183   Cardiac Enzymes: No results for input(s): CKTOTAL, CKMB, CKMBINDEX, TROPONINI in the last 168 hours. BNP (last 3 results) No results for input(s): PROBNP in the last 8760 hours. CBG: No results for input(s): GLUCAP in the last 168 hours.  Recent Results (from the past 240 hour(s))  MRSA PCR Screening     Status: None   Collection Time: 08/16/14  1:26 AM  Result Value Ref Range Status   MRSA by PCR NEGATIVE NEGATIVE Final    Comment:        The GeneXpert MRSA Assay (FDA approved for NASAL specimens only), is one component of a comprehensive MRSA colonization surveillance program. It is not intended to diagnose MRSA infection nor to guide or monitor treatment for MRSA infections.      Studies: No results found.  Scheduled Meds: . aspirin EC  325 mg Oral Q breakfast  . feeding supplement (RESOURCE BREEZE)  1 Container Oral TID BM  . heparin  5,000 Units Subcutaneous 3 times per day  . losartan  50 mg Oral Daily  . pravastatin  40 mg Oral q1800   Continuous Infusions:    Time spent: > 35 minutes    Penny PiaVEGA, [place] Kreitzer  [facility] Pager [phone]. If 7PM-7AM, please contact night-coverage at [email or web address], password [email or web address] 08/18/2014, 1:56 PM  LOS: 3 days

## 2014-08-18 NOTE — Progress Notes (Signed)
Physical Therapy Treatment Patient Details Name: Kendra Nixon MRN: [id number] DOB: 26-May-1941 Today's Date: 08/18/2014    History of Present Illness 73 yo female post fall and R hip fracture on 08/15/14, underwent ORIF for IM nail on 08/16/14.    PT Comments    Pain and fatigue limiting mobility today; At this point, I favor post-acute rehab to maximize independence and safety with mobility prior to dc to daughter's home;  Will continue to follow and update dc plans as appropriate (would she do better with better coordination with pain meds?)   Follow Up Recommendations  CIR     Equipment Recommendations  Rolling walker with 5" wheels;3in1 (PT)    Recommendations for Other Services Rehab consult;OT consult     Precautions / Restrictions Precautions Precautions: Fall Restrictions RLE Weight Bearing: Weight bearing as tolerated    Mobility  Bed Mobility Overal bed mobility: Needs Assistance Bed Mobility: Supine to Sit     Supine to sit: Mod assist     General bed mobility comments: Cues for technique; moves quite slowly; very limited by pain today  Transfers Overall transfer level: Needs assistance Equipment used: Rolling walker (2 wheeled) Transfers: Sit to/from Stand Sit to Stand: Mod assist         General transfer comment: Cues for technqiue, safety, and hand placement; required mod assist to lift off and to acheive fully upright standing  Ambulation/Gait Ambulation/Gait assistance: Mod assist Ambulation Distance (Feet): 8 Feet Assistive device: Rolling walker (2 wheeled) Gait Pattern/deviations: Step-to pattern Gait velocity: quite slow   General Gait Details: Quite limited by pain; Cues for gait sequence and to unweigh sore RLE by pushing down into RW when advancing LLE; pain and fatigue limited distance today   Stairs            Wheelchair Mobility    Modified Rankin (Stroke Patients Only)       Balance                                     Cognition Arousal/Alertness: Awake/alert Behavior During Therapy: WFL for tasks assessed/performed Overall Cognitive Status: Within Functional Limits for tasks assessed                      Exercises      General Comments        Pertinent Vitals/Pain Pain Assessment: Faces Faces Pain Scale: Hurts even more Pain Location: R hip Pain Descriptors / Indicators: Grimacing Pain Intervention(s): Limited activity within patient's tolerance;Monitored during session;Repositioned    Home Living                      Prior Function            PT Goals (current goals can now be found in the care plan section) Acute Rehab PT Goals Patient Stated Goal: To go home PT Goal Formulation: With patient Time For Goal Achievement: 08/31/14 Potential to Achieve Goals: Good Progress towards PT goals: Not progressing toward goals - comment (limited by pain, fatigue today)    Frequency  Min 4X/week    PT Plan Current plan remains appropriate    Co-evaluation             End of Session           Time: [id number] PT Time Calculation (min) (ACUTE ONLY): 24 min  Charges:  $Gait Training:  8-22 mins $Therapeutic Activity: 8-22 mins                    G Codes:      Van ClinesGarrigan, Ersel Wadleigh [facility] 08/18/2014, 2:38 PM  Van ClinesHolly Didi Ganaway, [place]PT  Acute Rehabilitation Services Pager [phone] Office [phone]

## 2014-08-19 ENCOUNTER — Encounter (HOSPITAL_COMMUNITY): Payer: Self-pay | Admitting: Orthopedic Surgery

## 2014-08-19 MED ORDER — BOOST / RESOURCE BREEZE PO LIQD: 1.0000 | Freq: Three times a day (TID) | ORAL | Status: DC

## 2014-08-19 NOTE — Progress Notes (Signed)
Utilization review completed.  

## 2014-08-19 NOTE — Progress Notes (Signed)
Rehab Admissions Coordinator Note:  Patient was screened by Trish MageLogue, Javid Kemler M for appropriateness for an Inpatient Acute Rehab Consult.  At this time, we are recommending Inpatient Rehab consult.  Trish MageLogue, Lalo Tromp M 08/19/2014, 10:57 AM  I can be reached at [phone].

## 2014-08-19 NOTE — Clinical Social Work Note (Signed)
[facility]. Pt's daughter updated regarding discharge.  [facility] Report number: [phone] Transportation: [address])  Marcelline DeistEmily Michelle Wnek, LCSWA [phone]) Licensed Clinical Social Worker Orthopedics [phone]([id number]) and Surgical [phone]([id number])

## 2014-08-19 NOTE — Clinical Social Work Note (Addendum)
CSW has initiated [employer] Medicare SNF authorization. CSW to update once authorization received.  SNF authorization: [id number]  Marcelline Deistmily Jarell Mcewen, Theresia MajorsLCSWA [phone]) Licensed Clinical Social Worker Orthopedics [phone](5N17-32) and Surgical [phone]([id number])

## 2014-08-19 NOTE — Progress Notes (Signed)
Physical Therapy Treatment Patient Details Name: Kendra Nixon MRN: [id number] DOB: [birth year] Today's Date: 08/19/2014    History of Present Illness 73 yo female post fall and R hip fracture on 08/15/14, underwent ORIF for IM nail on 08/16/14.    PT Comments    Pt is not progressing as would be expected and feel that she would not tolerate pace/intensity of CIR and am recommending SNF at this time.    Follow Up Recommendations  SNF     Equipment Recommendations  Rolling walker with 5" wheels;3in1 (PT)    Recommendations for Other Services       Precautions / Restrictions Precautions Precautions: Fall Restrictions RLE Weight Bearing: Weight bearing as tolerated    Mobility  Bed Mobility Overal bed mobility: Needs Assistance Bed Mobility: Supine to Sit     Supine to sit: Min assist     General bed mobility comments: slow transfer.  MIN A for R LE  Transfers Overall transfer level: Needs assistance Equipment used: Rolling walker (2 wheeled) Transfers: Sit to/from Stand Sit to Stand: Min guard         General transfer comment: Stood fairly well from bed and recliner and able to boost self with UE.  Ambulation/Gait Ambulation/Gait assistance: Mod assist Ambulation Distance (Feet): 8 Feet Assistive device: Rolling walker (2 wheeled) Gait Pattern/deviations: Antalgic;Step-to pattern Gait velocity: slow Gait velocity interpretation: Below normal speed for age/gender General Gait Details: Limited by pain and needed max cueing to unweight R LE through B UE.   Stairs            Wheelchair Mobility    Modified Rankin (Stroke Patients Only)       Balance           Standing balance support: Bilateral upper extremity supported Standing balance-Leahy Scale: Poor                      Cognition Arousal/Alertness: Awake/alert Behavior During Therapy: WFL for tasks assessed/performed Overall Cognitive Status: Within Functional Limits for  tasks assessed                      Exercises General Exercises - Lower Extremity Ankle Circles/Pumps: AROM;Both Quad Sets: Strengthening;Right;10 reps;Supine Heel Slides: AAROM;Both;10 reps;Supine Hip ABduction/ADduction: AAROM;Both;10 reps;Supine    General Comments        Pertinent Vitals/Pain Pain Assessment: Faces Faces Pain Scale: Hurts even more Pain Location: R hip Pain Descriptors / Indicators: Grimacing Pain Intervention(s): Patient requesting pain meds-RN notified;Repositioned    Home Living                      Prior Function            PT Goals (current goals can now be found in the care plan section) Acute Rehab PT Goals Patient Stated Goal: To go home and not fall PT Goal Formulation: With patient Time For Goal Achievement: 08/31/14 Potential to Achieve Goals: Good Progress towards PT goals: Not progressing toward goals - comment (limited by pain)    Frequency  Min 4X/week    PT Plan Discharge plan needs to be updated    Co-evaluation             End of Session Equipment Utilized During Treatment: Gait belt Activity Tolerance: Patient limited by pain Patient left: in chair;with nursing/sitter in room     Time: 0926-0950 PT Time Calculation (min) (ACUTE ONLY): 24 min  Charges:  $Gait  Training: 8-22 mins $Therapeutic Exercise: 8-22 mins                    G Codes:      Kendra Nixon 08/19/2014, 11:00 AM

## 2014-08-19 NOTE — Progress Notes (Signed)
Patient ID: Windell Mouldingunice Vivian, female   DOB: 1940-12-22, 73 y.o.   MRN: [id number]     Subjective:  Patient reports pain as mild to moderate.  Patient denies any CP or SOB.  Objective:   VITALS:   Filed Vitals:   08/18/14 1400 08/18/14 1500 08/18/14 2116 08/19/14 0652  BP: 106/65  116/69 141/67  Pulse: 89  51 78  Temp: 97.8 F (36.6 C)  98.4 F (36.9 C) 99.4 F (37.4 C)  TempSrc:   Oral Oral  Resp: 15  16 16   Weight:  52.617 kg (116 lb)    SpO2: 100%  98% 100%    ABD soft Sensation intact distally Dorsiflexion/Plantar flexion intact Incision: dressing C/D/I and no drainage   Lab Results  Component Value Date   WBC 5.2 08/15/2014   HGB 14.1 08/15/2014   HCT 40.8 08/15/2014   MCV 83.3 08/15/2014   PLT 183 08/15/2014     Assessment/Plan: 3 Days Post-Op   Active Problems:   Pulmonary nodule   Fracture, intertrochanteric, right femur   Hyperlipidemia   Essential hypertension   Tobacco use   Malnutrition of moderate degree   Advance diet Up with therapy  Plan per medicine WBAT Follow up with Margarita Ranaimothy Murphy in two weeks Ortho signing off    Haskel KhanDOUGLAS Deitrich Steve 08/19/2014, 9:15 AM   Margarita Ranaimothy Murphy MD [phone]

## 2014-08-19 NOTE — Clinical Social Work Psychosocial (Signed)
Clinical Social Work Department BRIEF PSYCHOSOCIAL ASSESSMENT 08/19/2014  Patient:  Kendra Nixon,Kendra Nixon     Account Number:  [id number]     Admit date:  08/15/2014  Clinical Social Worker:  Mosie EpsteinVAUGHN,Dallyn Bergland S, LCSWA  Date/Time:  08/19/2014 01:10 PM  Referred by:  Physician  Date Referred:  08/19/2014 Referred for  SNF Placement   Other Referral:   CIR placement.   Interview type:  Family Other interview type:   CSW spoke with pt's daughter, Kendra Nixon.    PSYCHOSOCIAL DATA Living Status:  FACILITY Admitted from facility:  Other Level of care:  Independent Living Primary support name:  Kendra Nixon Primary support relationship to patient:  CHILD, ADULT Degree of support available:   Strong support system.    CURRENT CONCERNS Current Concerns  Post-Acute Placement   Other Concerns:   none.    SOCIAL WORK ASSESSMENT / PLAN CSW received referral for possible SNF placement at time of discharge. CSW spoke with pt's daughter regarding discharge disposition. Per pt's daughter[facility]. Pt's daughter understanding of PT recommendation for SNF at time of discharge. [facility].    CSW to continue to follow and assist with discharge planning needs.   Assessment/plan status:  Psychosocial Support/Ongoing Assessment of Needs Other assessment/ plan:   none.   Information/referral to community resources:   [facility]    PATIENT'S/FAMILY'S RESPONSE TO PLAN OF CARE: Pt's daughter understanding and agreeable to CSW plan of care. Pt's daughter expressed no further questions or concerns at this time.       Kendra Nixon, LCSWA [phone]) Licensed Clinical Social Worker Orthopedics [phone]([id number]) and Surgical [phone]([id number])
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
## 2014-08-19 NOTE — Clinical Social Work Placement (Addendum)
Clinical Social Work Department CLINICAL SOCIAL WORK PLACEMENT NOTE 08/19/2014  Patient:  Kendra MouldingNGRAM,[name]  Account Number:  [id number] Admit date:  08/15/2014  Clinical Social Worker:  Mosie EpsteinEMILY S Jennea Rager, LCSWA  Date/time:  08/19/2014 01:17 PM  Clinical Social Work is seeking post-discharge placement for this patient at the following level of care:   SKILLED NURSING   (*CSW will update this form in Epic as items are completed)   08/19/2014  Patient/family provided with [name] Department of Clinical Social Work's list of facilities offering this level of care within the geographic area requested by the patient (or if unable, by the patient's family).  08/19/2014  Patient/family informed of their freedom to choose among providers that offer the needed level of care, that participate in Medicare, Medicaid or managed care program needed by the patient, have an available bed and are willing to accept the patient.  08/19/2014  Patient/family informed of [facility]' ownership interest in [facility], as well as of the fact that they are under no obligation to receive care at this facility.  PASARR submitted to EDS on 08/19/2014 PASARR number received on 08/19/2014  FL2 transmitted to all facilities in geographic area requested by pt/family on  08/19/2014 FL2 transmitted to all facilities within larger geographic area on   Patient informed that his/her managed care company has contracts with or will negotiate with  certain facilities, including the following:     Patient/family informed of bed offers received:  08/19/2014 [facility] Physician recommends and patient chooses bed at    [facility] on  08/19/2014 Patient to be transferred to facility by [facility] Patient and family notified of transfer on 08/19/2014 Name of family member notified:  Pt's daughter updated regarding  discharge.  The following physician request were entered in Epic:   Additional Comments:  Lily Kochermily Christmas Faraci, LCSWA [phone]) Licensed Clinical Social Worker Orthopedics [phone]([id number]) and Surgical [phone]([id number])

## 2015-01-29 ENCOUNTER — Encounter: Payer: Self-pay | Admitting: Family

## 2015-01-29 ENCOUNTER — Ambulatory Visit (INDEPENDENT_AMBULATORY_CARE_PROVIDER_SITE_OTHER): Payer: Medicare Other | Admitting: Family

## 2015-01-29 ENCOUNTER — Other Ambulatory Visit (INDEPENDENT_AMBULATORY_CARE_PROVIDER_SITE_OTHER): Payer: Medicare Other

## 2015-01-29 VITALS — BP 130/82 | HR 72 | Temp 97.7°F | Ht 64.0 in | Wt 114.0 lb

## 2015-01-29 DIAGNOSIS — Z0000 Encounter for general adult medical examination without abnormal findings: Secondary | ICD-10-CM

## 2015-01-29 LAB — LIPID PANEL
Cholesterol: 210 mg/dL — ABNORMAL HIGH (ref 0–200)
HDL: 94.6 mg/dL (ref >39.00)
LDL CALC: 106 mg/dL — AB (ref 0–99)
NONHDL: 115.4
Total CHOL/HDL Ratio: 2
Triglycerides: 47 mg/dL (ref 0.0–149.0)
VLDL: 9.4 mg/dL (ref 0.0–40.0)

## 2015-01-29 LAB — BASIC METABOLIC PANEL
BUN: 15 mg/dL (ref 6–23)
CHLORIDE: 103 meq/L (ref 96–112)
CO2: 31 meq/L (ref 19–32)
Calcium: 9.7 mg/dL (ref 8.4–10.5)
Creatinine, Ser: 0.8 mg/dL (ref 0.40–1.20)
GFR: 89.65 mL/min (ref >60.00)
Glucose, Bld: 81 mg/dL (ref 70–99)
POTASSIUM: 4.1 meq/L (ref 3.5–5.1)
SODIUM: 139 meq/L (ref 135–145)

## 2015-01-29 LAB — CBC
HEMATOCRIT: 41.6 % (ref 36.0–46.0)
HEMOGLOBIN: 13.7 g/dL (ref 12.0–15.0)
MCHC: 32.9 g/dL (ref 30.0–36.0)
MCV: 85.1 fl (ref 78.0–100.0)
Platelets: 158 10*3/uL (ref 150.0–400.0)
RBC: 4.89 Mil/uL (ref 3.87–5.11)
RDW: 15.5 % (ref 11.5–15.5)
WBC: 4 10*3/uL (ref 4.0–10.5)

## 2015-01-29 LAB — TSH: TSH: 1.34 u[IU]/mL (ref 0.35–4.50)

## 2015-01-29 MED ORDER — LOVASTATIN 40 MG PO TABS: 40.0000 mg | ORAL_TABLET | Freq: Every day | ORAL | Status: DC

## 2015-01-29 NOTE — Assessment & Plan Note (Signed)
Reviewed and updated patient's medical, surgical, family and social history. Medications and allergies were also reviewed. Basic screenings for depression, activities of daily living, hearing, cognition and safety were performed. Provider list was updated and health plan was provided to the patient.  

## 2015-01-29 NOTE — Assessment & Plan Note (Addendum)
1) Anticipatory Guidance: Discussed importance of wearing a seatbelt while driving and not texting while driving; changing batteries in smoke detector at least once annually; wearing suntan lotion when outside; eating a balanced and moderate diet; getting physical activity at least 30 minutes per day.  2) Immunizations / Screenings / Labs:  Addressed immunization deficiency and patient declines tetanus, zostavax and Prevnar. Other immunizations are up to date. Due for dental and vision exam which she will consider. Declines DEXA. Colonoscopy out of age range and declined. Other screenings are up to date per recommendations. Obtain CBC, BMET, Lipid profile and TSH.

## 2015-01-29 NOTE — Patient Instructions (Addendum)
Thank you for choosing [facility].  Summary/Instructions:  Your prescription(s) have been submitted to your pharmacy or been printed and provided for you. Please take as directed and contact our office if you believe you are having problem(s) with the medication(s) or have any questions.  Please stop by the lab on the basement level of the building for your blood work. Your results will be released to [place] (or called to you) after review, usually within 72 hours after test completion. If any changes need to be made, you will be notified at that same time.    Health Maintenance  Topic Date Due  . TETANUS/TDAP  11/25/1957  . COLONOSCOPY  11/25/1988  . ZOSTAVAX  11/26/1998  . DEXA SCAN  11/26/2003  . PNA vac Low Risk Adult (1 of 2 - PCV13) 11/26/2003  . INFLUENZA VACCINE  04/07/2015      Advance Directive Advance directives are the legal documents that allow you to make choices about your health care and medical treatment if you cannot speak for yourself. Advance directives are a way for you to communicate your wishes to family, friends, and health care providers. The specified people can then convey your decisions about end-of-life care to avoid confusion if you should become unable to communicate. Ideally, the process of discussing and writing advance directives should happen over time rather than making decisions all at once. Advance directives can be modified as your situation changes, and you can change your mind at any time, even after you have signed the advance directives. Each state has its own laws regarding advance directives. You may want to check with your health care provider, attorney, or state representative about the law in your state. Below are some examples of advance directives. LIVING WILL A living will is a set of instructions documenting your wishes about medical care when you cannot care for yourself. It is used if you become:  Terminally  ill.  Incapacitated.  Unable to communicate.  Unable to make decisions. Items to consider in your living will include:  The use or non-use of life-sustaining equipment, such as dialysis machines and breathing machines (ventilators).  A do not resuscitate (DNR) order, which is the instruction not to use cardiopulmonary resuscitation (CPR) if breathing or heartbeat stops.  Tube feeding.  Withholding of food and fluids.  Comfort (palliative) care when the goal becomes comfort rather than a cure.  Organ and tissue donation. A living will does not give instructions about distribution of your money and property if you should pass away. It is advisable to seek the expert advice of a lawyer in drawing up a will regarding your possessions. Decisions about taxes, beneficiaries, and asset distribution will be legally binding. This process can relieve your family and friends of any burdens surrounding disputes or questions that may come up about the allocation of your assets. DO NOT RESUSCITATE (DNR) A do not resuscitate (DNR) order is a request to not have CPR in the event that your heart stops beating or you stop breathing. Unless given other instructions, a health care provider will try to help any patient whose heart has stopped or who has stopped breathing.  HEALTH CARE PROXY AND DURABLE POWER OF ATTORNEY FOR HEALTH CARE A health care proxy is a person (agent) appointed to make medical decisions for you if you cannot. Generally, people choose someone they know well and trust to represent their preferences when they can no longer do so. You should be sure to ask this person  for agreement to act as your agent. An agent may have to exercise judgment in the event of a medical decision for which your wishes are not known. The durable power of attorney for health care is the legal document that names your health care proxy. Once written, it should  be:  Signed.  Notarized.  Dated.  Copied.  Witnessed.  Incorporated into your medical record. You may also want to appoint someone to manage your financial affairs if you cannot. This is called a durable power of attorney for finances. It is a separate legal document from the durable power of attorney for health care. You may choose the same person or someone different from your health care proxy to act as your agent in financial matters. Document Released: 11/30/2007 Document Revised: 08/28/2013 Document Reviewed: 01/10/2013 [facility] Patient Information 2015 [place], [place]. This information is not intended to replace advice given to you by your health care provider. Make sure you discuss any questions you have with your health care provider.   Health Maintenance Adopting a healthy lifestyle and getting preventive care can go a long way to promote health and wellness. Talk with your health care provider about what schedule of regular examinations is right for you. This is a good chance for you to check in with your provider about disease prevention and staying healthy. In between checkups, there are plenty of things you can do on your own. Experts have done a lot of research about which lifestyle changes and preventive measures are most likely to keep you healthy. Ask your health care provider for more information. WEIGHT AND DIET  Eat a healthy diet  Be sure to include plenty of vegetables, fruits, low-fat dairy products, and lean protein.  Do not eat a lot of foods high in solid fats, added sugars, or salt.  Get regular exercise. This is one of the most important things you can do for your health.  Most adults should exercise for at least 150 minutes each week. The exercise should increase your heart rate and make you sweat (moderate-intensity exercise).  Most adults should also do strengthening exercises at least twice a week. This is in addition to the moderate-intensity  exercise.  Maintain a healthy weight  Body mass index (BMI) is a measurement that can be used to identify possible weight problems. It estimates body fat based on height and weight. Your health care provider can help determine your BMI and help you achieve or maintain a healthy weight.  For females 59 years of age and older:   A BMI below 18.5 is considered underweight.  A BMI of 18.5 to 24.9 is normal.  A BMI of 25 to 29.9 is considered overweight.  A BMI of 30 and above is considered obese.  Watch levels of cholesterol and blood lipids  You should start having your blood tested for lipids and cholesterol at 76 years of age, then have this test every 5 years.  You may need to have your cholesterol levels checked more often if:  Your lipid or cholesterol levels are high.  You are older than 76 years of age.  You are at high risk for heart disease.  CANCER SCREENING   Lung Cancer  Lung cancer screening is recommended for adults 51-74 years old who are at high risk for lung cancer because of a history of smoking.  A yearly low-dose CT scan of the lungs is recommended for people who:  Currently smoke.  Have quit within the past  15 years.  Have at least a 30-pack-year history of smoking. A pack year is smoking an average of one pack of cigarettes a day for 1 year.  Yearly screening should continue until it has been 15 years since you quit.  Yearly screening should stop if you develop a health problem that would prevent you from having lung cancer treatment.  Breast Cancer  Practice breast self-awareness. This means understanding how your breasts normally appear and feel.  It also means doing regular breast self-exams. Let your health care provider know about any changes, no matter how small.  If you are in your 20s or 30s, you should have a clinical breast exam (CBE) by a health care provider every 1-3 years as part of a regular health exam.  If you are 21 or  older, have a CBE every year. Also consider having a breast X-ray (mammogram) every year.  If you have a family history of breast cancer, talk to your health care provider about genetic screening.  If you are at high risk for breast cancer, talk to your health care provider about having an MRI and a mammogram every year.  Breast cancer gene (BRCA) assessment is recommended for women who have family members with BRCA-related cancers. BRCA-related cancers include:  Breast.  Ovarian.  Tubal.  Peritoneal cancers.  Results of the assessment will determine the need for genetic counseling and BRCA1 and BRCA2 testing. Cervical Cancer Routine pelvic examinations to screen for cervical cancer are no longer recommended for nonpregnant women who are considered low risk for cancer of the pelvic organs (ovaries, uterus, and vagina) and who do not have symptoms. A pelvic examination may be necessary if you have symptoms including those associated with pelvic infections. Ask your health care provider if a screening pelvic exam is right for you.   The Pap test is the screening test for cervical cancer for women who are considered at risk.  If you had a hysterectomy for a problem that was not cancer or a condition that could lead to cancer, then you no longer need Pap tests.  If you are older than 65 years, and you have had normal Pap tests for the past 10 years, you no longer need to have Pap tests.  If you have had past treatment for cervical cancer or a condition that could lead to cancer, you need Pap tests and screening for cancer for at least 20 years after your treatment.  If you no longer get a Pap test, assess your risk factors if they change (such as having a new sexual partner). This can affect whether you should start being screened again.  Some women have medical problems that increase their chance of getting cervical cancer. If this is the case for you, your health care provider may  recommend more frequent screening and Pap tests.  The human papillomavirus (HPV) test is another test that may be used for cervical cancer screening. The HPV test looks for the virus that can cause cell changes in the cervix. The cells collected during the Pap test can be tested for HPV.  The HPV test can be used to screen women 3 years of age and older. Getting tested for HPV can extend the interval between normal Pap tests from three to five years.  An HPV test also should be used to screen women of any age who have unclear Pap test results.  After 76 years of age, women should have HPV testing as often as Pap  tests.  Colorectal Cancer  This type of cancer can be detected and often prevented.  Routine colorectal cancer screening usually begins at 76 years of age and continues through 76 years of age.  Your health care provider may recommend screening at an earlier age if you have risk factors for colon cancer.  Your health care provider may also recommend using home test kits to check for hidden blood in the stool.  A small camera at the end of a tube can be used to examine your colon directly (sigmoidoscopy or colonoscopy). This is done to check for the earliest forms of colorectal cancer.  Routine screening usually begins at age 67.  Direct examination of the colon should be repeated every 5-10 years through 76 years of age. However, you may need to be screened more often if early forms of precancerous polyps or small growths are found. Skin Cancer  Check your skin from head to toe regularly.  Tell your health care provider about any new moles or changes in moles, especially if there is a change in a mole's shape or color.  Also tell your health care provider if you have a mole that is larger than the size of a pencil eraser.  Always use sunscreen. Apply sunscreen liberally and repeatedly throughout the day.  Protect yourself by wearing long sleeves, pants, a wide-brimmed hat,  and sunglasses whenever you are outside. HEART DISEASE, DIABETES, AND HIGH BLOOD PRESSURE   Have your blood pressure checked at least every 1-2 years. High blood pressure causes heart disease and increases the risk of stroke.  If you are between 40 years and 55 years old, ask your health care provider if you should take aspirin to prevent strokes.  Have regular diabetes screenings. This involves taking a blood sample to check your fasting blood sugar level.  If you are at a normal weight and have a low risk for diabetes, have this test once every three years after 76 years of age.  If you are overweight and have a high risk for diabetes, consider being tested at a younger age or more often. PREVENTING INFECTION  Hepatitis B  If you have a higher risk for hepatitis B, you should be screened for this virus. You are considered at high risk for hepatitis B if:  You were born in a country where hepatitis B is common. Ask your health care provider which countries are considered high risk.  Your parents were born in a high-risk country, and you have not been immunized against hepatitis B (hepatitis B vaccine).  You have HIV or AIDS.  You use needles to inject street drugs.  You live with someone who has hepatitis B.  You have had sex with someone who has hepatitis B.  You get hemodialysis treatment.  You take certain medicines for conditions, including cancer, organ transplantation, and autoimmune conditions. Hepatitis C  Blood testing is recommended for:  Everyone born from [birth year] through 1967.  Anyone with known risk factors for hepatitis C. Sexually transmitted infections (STIs)  You should be screened for sexually transmitted infections (STIs) including gonorrhea and chlamydia if:  You are sexually active and are younger than 76 years of age.  You are older than 76 years of age and your health care provider tells you that you are at risk for this type of infection.  Your  sexual activity has changed since you were last screened and you are at an increased risk for chlamydia or gonorrhea. Ask your health  care provider if you are at risk.  If you do not have HIV, but are at risk, it may be recommended that you take a prescription medicine daily to prevent HIV infection. This is called pre-exposure prophylaxis (PrEP). You are considered at risk if:  You are sexually active and do not regularly use condoms or know the HIV status of your partner(s).  You take drugs by injection.  You are sexually active with a partner who has HIV. Talk with your health care provider about whether you are at high risk of being infected with HIV. If you choose to begin PrEP, you should first be tested for HIV. You should then be tested every 3 months for as long as you are taking PrEP.  PREGNANCY   If you are premenopausal and you may become pregnant, ask your health care provider about preconception counseling.  If you may become pregnant, take 400 to 800 micrograms (mcg) of folic acid every day.  If you want to prevent pregnancy, talk to your health care provider about birth control (contraception). OSTEOPOROSIS AND MENOPAUSE   Osteoporosis is a disease in which the bones lose minerals and strength with aging. This can result in serious bone fractures. Your risk for osteoporosis can be identified using a bone density scan.  If you are 72 years of age or older, or if you are at risk for osteoporosis and fractures, ask your health care provider if you should be screened.  Ask your health care provider whether you should take a calcium or vitamin D supplement to lower your risk for osteoporosis.  Menopause may have certain physical symptoms and risks.  Hormone replacement therapy may reduce some of these symptoms and risks. Talk to your health care provider about whether hormone replacement therapy is right for you.  HOME CARE INSTRUCTIONS   Schedule regular health, dental, and  eye exams.  Stay current with your immunizations.   Do not use any tobacco products including cigarettes, chewing tobacco, or electronic cigarettes.  If you are pregnant, do not drink alcohol.  If you are breastfeeding, limit how much and how often you drink alcohol.  Limit alcohol intake to no more than 1 drink per day for nonpregnant women. One drink equals 12 ounces of beer, 5 ounces of wine, or 1 ounces of hard liquor.  Do not use street drugs.  Do not share needles.  Ask your health care provider for help if you need support or information about quitting drugs.  Tell your health care provider if you often feel depressed.  Tell your health care provider if you have ever been abused or do not feel safe at home. Document Released: 03/08/2011 Document Revised: 01/07/2014 Document Reviewed: 07/25/2013 [facility] Patient Information 2015 [place], [place]. This information is not intended to replace advice given to you by your health care provider. Make sure you discuss any questions you have with your health care provider.

## 2015-01-29 NOTE — Progress Notes (Signed)
Subjective:    Patient ID: Kendra Nixon, female    DOB: 1940/09/12, 74 y.o.   MRN: [id number]  Chief Complaint  Patient presents with  . Establish Care    New patient    HPI:  Kendra Nixon is a 74 y.o. female who presents today for an annual wellness visit.   1) Health Maintenance -   Diet - Averages about 3 meals per day consisting of fruits, vegetables, meats; Caffeine 2-3 cups   Exercise - No structured exercise  2) Preventative Exams / Immunizations:  Dental -- Due for exam  Vision -- Due for exam    Health Maintenance  Topic Date Due  . TETANUS/TDAP  11/25/1957  . COLONOSCOPY  11/25/1988  . ZOSTAVAX  11/26/1998  . DEXA SCAN  11/26/2003  . PNA vac Low Risk Adult (1 of 2 - PCV13) 11/26/2003  . INFLUENZA VACCINE  04/07/2015  Decline tetanus, decline shingles, dEXA; Declines PNA;    There is no immunization history for the selected administration types on file for this patient.   RISK FACTORS  Tobacco History  Smoking status  . Former Smoker -- 0.25 packs/day for 30 years  . Types: Cigarettes  . Quit date: 10/27/2012  Smokeless tobacco  . Never Used     Cardiac risk factors: advanced age (older than 59 for men, 49 for women), dyslipidemia, hypertension and sedentary lifestyle.  Depression Screen  Q1: Over the past two weeks, have you felt down, depressed or hopeless? No  Q2: Over the past two weeks, have you felt little interest or pleasure in doing things? No  Have you lost interest or pleasure in daily life? No  Do you often feel hopeless? No  Do you cry easily over simple problems? No  Activities of Daily Living In your present state of health, do you have any difficulty performing the following activities?:  Driving? Does not drive Managing money?  No - assistance from daughter Feeding yourself? No  Getting from bed to chair? No Climbing a flight of stairs? No  Preparing food and eating?: No Bathing or showering? No Getting dressed:  No Getting to the toilet? No Using the toilet: No Moving around from place to place: No In the past year have you fallen or had a near fall?: No   Home Safety Has smoke detector and wears seat belts. No firearms. No excess sun exposure. Are there smokers in your home (other than you)?  No Do you feel safe at home?  Yes  Hearing Difficulties: No Do you often ask people to speak up or repeat themselves? No Do you experience ringing or noises in your ears? No  Do you have difficulty understanding soft or whispered voices? No    Cognitive Testing  Alert? Yes   Normal Appearance? Yes  Oriented to person? Yes  Place? Yes   Time? No  Recall of three objects?  Yes  Can perform simple calculations? Yes  Displays appropriate judgment? Yes  Can read the correct time from a watch face? Yes  Do you feel that you have a problem with memory? No  Do you often misplace items? No   Advanced Directives have been discussed with the patient?   Current Physicians/Providers and Suppliers  1. Marcos Eke, FNP - Primary Care   Indicate any recent Medical Services you may have received from other than [facility] providers in the past year (date may be approximate).  All answers were reviewed with the patient and necessary referrals were  made:  Jeanine LuzCalone, Keng Jewel, FNP   01/29/2015    Review of Systems  Constitutional: Denies fever, chills, fatigue, or significant weight gain/loss. HENT: Head: Denies headache or neck pain Ears: Denies changes in hearing, ringing in ears, earache, drainage Nose: Denies discharge, stuffiness, itching, nosebleed, sinus pain Throat: Denies sore throat, hoarseness, dry mouth, sores, thrush Eyes: Denies loss/changes in vision, pain, redness, blurry/double vision, flashing lights Cardiovascular: Denies chest pain/discomfort, tightness, palpitations, shortness of breath with activity, difficulty lying down, swelling, sudden awakening with shortness of breath Respiratory: Denies  shortness of breath, cough, sputum production, wheezing Gastrointestinal: Denies dysphasia, heartburn, change in appetite, nausea, change in bowel habits, rectal bleeding, constipation, diarrhea, yellow skin or eyes Genitourinary: Denies frequency, urgency, burning/pain, blood in urine, incontinence, change in urinary strength. Musculoskeletal: Denies muscle/joint pain, stiffness, back pain, redness or swelling of joints, trauma Skin: Denies rashes, lumps, itching, dryness, color changes, or hair/nail changes Neurological: Denies dizziness, fainting, seizures, weakness, numbness, tingling, tremor Psychiatric - Denies nervousness, stress, depression or memory loss Endocrine: Denies heat or cold intolerance, sweating, frequent urination, excessive thirst, changes in appetite Hematologic: Denies ease of bruising or bleeding    Objective:     BP 130/82 mmHg  Pulse 72  Temp(Src) 97.7 F (36.5 C) (Oral)  Ht 5\' 4"  (1.626 m)  Wt 114 lb (51.71 kg)  BMI 19.56 kg/m2  SpO2 95% Nursing note and vital signs reviewed.   MMSE - Mini Mental State Exam 01/29/2015  Orientation to time 3  Orientation to Place 5  Registration 3  Attention/ Calculation 5  Recall 3  Language- name 2 objects 2  Language- repeat 1  Language- follow 3 step command 3  Language- read & follow direction 1  Write a sentence 1  Copy design 1  Total score 28     Physical Exam  Constitutional: She is oriented to person, place, and time. She appears well-developed and well-nourished.  HENT:  Head: Normocephalic.  Right Ear: Hearing, tympanic membrane, external ear and ear canal normal.  Left Ear: Hearing, tympanic membrane, external ear and ear canal normal.  Nose: Nose normal.  Mouth/Throat: Uvula is midline, oropharynx is clear and moist and mucous membranes are normal.  Eyes: Conjunctivae and EOM are normal. Pupils are equal, round, and reactive to light.  Neck: Neck supple. No JVD present. No tracheal deviation  present. No thyromegaly present.  Cardiovascular: Normal rate, regular rhythm, normal heart sounds and intact distal pulses.   Pulmonary/Chest: Effort normal and breath sounds normal.  Abdominal: Soft. Bowel sounds are normal. She exhibits no distension and no mass. There is no tenderness. There is no rebound and no guarding.  Musculoskeletal: Normal range of motion. She exhibits no edema or tenderness.  Lymphadenopathy:    She has no cervical adenopathy.  Neurological: She is alert and oriented to person, place, and time. She has normal reflexes. No cranial nerve deficit. She exhibits normal muscle tone. Coordination normal.  Skin: Skin is warm and dry.  Psychiatric: She has a normal mood and affect. Her behavior is normal. Judgment and thought content normal.       Assessment & Plan:   During the course of the visit the patient was educated and counseled about appropriate screening and preventive services including:    Influenza vaccine  Colorectal cancer screening  Nutrition counseling   Advanced directives: has NO advanced directive - not interested in additional information  Diet review for nutrition referral? Yes ____  Not Indicated _X___   Patient Instructions (  the written plan) was given to the patient.  Medicare Attestation I have personally reviewed: The patient's medical and social history Their use of alcohol, tobacco or illicit drugs Their current medications and supplements The patient's functional ability including ADLs,fall risks, home safety risks, cognitive, and hearing and visual impairment Diet and physical activities Evidence for depression or mood disorders  The patient's weight, height, BMI,  have been recorded in the chart.  I have made referrals, counseling, and provided education to the patient based on review of the above and I have provided the patient with a written personalized care plan for preventive services.     Jeanine Luz,  FNP   01/29/2015

## 2015-01-30 ENCOUNTER — Telehealth: Payer: Self-pay | Admitting: Family

## 2015-01-30 NOTE — Telephone Encounter (Signed)
Please inform the patient that her white/red blood cells, kidney function, electrolytes, cholesterol and thyroid function were all within the normal limits. Therefore we can plan to follow up in 1 year for her annual wellness exam.

## 2015-01-30 NOTE — Telephone Encounter (Signed)
Let daughter know lab results. She will relay them to pt.

## 2015-04-03 ENCOUNTER — Telehealth: Payer: Self-pay | Admitting: Family

## 2015-04-03 NOTE — Telephone Encounter (Signed)
Patients daughter was filling up her medication for the week and she seen she only has 3 pills left on her losartan (COZAAR) 50 MG tablet [[id number]. Pharmacy can't refill until 8/12 and insurance won't cover the extra medication. She thinks that they may have gotten thrown out. She is wondering if you would have any samples to give her to get her by till the 12th. Please advise

## 2015-04-04 MED ORDER — LOSARTAN POTASSIUM 50 MG PO TABS: 50.0000 mg | ORAL_TABLET | Freq: Every day | ORAL | Status: DC

## 2015-04-07 NOTE — Telephone Encounter (Signed)
Medication has been refilled.

## 2015-08-14 ENCOUNTER — Ambulatory Visit (INDEPENDENT_AMBULATORY_CARE_PROVIDER_SITE_OTHER): Payer: Medicare Other | Admitting: Family

## 2015-08-14 ENCOUNTER — Encounter: Payer: Self-pay | Admitting: Family

## 2015-08-14 VITALS — BP 136/84 | HR 58 | Temp 97.9°F | Resp 14 | Ht 64.0 in | Wt 115.0 lb

## 2015-08-14 DIAGNOSIS — Z23 Encounter for immunization: Secondary | ICD-10-CM

## 2015-08-14 DIAGNOSIS — S72141S Displaced intertrochanteric fracture of right femur, sequela: Secondary | ICD-10-CM

## 2015-08-14 DIAGNOSIS — I10 Essential (primary) hypertension: Secondary | ICD-10-CM | POA: Diagnosis not present

## 2015-08-14 DIAGNOSIS — E785 Hyperlipidemia, unspecified: Secondary | ICD-10-CM

## 2015-08-14 MED ORDER — MELOXICAM 7.5 MG PO TABS: 7.5000 mg | ORAL_TABLET | Freq: Two times a day (BID) | ORAL | Status: DC

## 2015-08-14 MED ORDER — LOSARTAN POTASSIUM 50 MG PO TABS: 50.0000 mg | ORAL_TABLET | Freq: Every day | ORAL | Status: DC

## 2015-08-14 MED ORDER — LOVASTATIN 40 MG PO TABS: 40.0000 mg | ORAL_TABLET | Freq: Every day | ORAL | Status: DC

## 2015-08-14 NOTE — Progress Notes (Signed)
Subjective:    Patient ID: Kendra Nixon, female    DOB: 1941/01/10, 74 y.o.   MRN: [id number]  Chief Complaint  Patient presents with  . Medication follow up    wants 90 day supplies on all meds    HPI:  Kendra Nixon is a 74 y.o. female who  has a past medical history of Hypertension; ACE-inhibitor cough; History of CVA (cerebrovascular accident); Solitary lung nodule; and Hyperlipidemia LDL goal < 100. and presents today for a follow up office visit.   1.) Hypertension - Currently maintained on losartan. When she takes the medication she denies adverse side effects or hypotensive readings. Denies symptoms of end organ damage. Does have her blood pressure checked on a weekly basis and was told that it was good.  BP Readings from Last 3 Encounters:  08/14/15 136/84  01/29/15 130/82  08/19/14 128/65   2.) Hyperlipidemia - Currently maintained on lovastatin. Takes her medication as prescribed and denies adverse side effects or myalgias.  Lab Results  Component Value Date   CHOL 210* 01/29/2015   HDL 94.60 01/29/2015   LDLCALC 106* 01/29/2015   TRIG 47.0 01/29/2015   CHOLHDL 2 01/29/2015   No Known Allergies   Current Outpatient Prescriptions on File Prior to Visit  Medication Sig Dispense Refill  . aspirin 81 MG tablet Take 81 mg by mouth daily.     No current facility-administered medications on file prior to visit.     Past Surgical History  Procedure Laterality Date  . Breast lumpectomy Bilateral age 89  . Femur im nail Right 08/16/2014    Procedure: INTRAMEDULLARY (IM) NAIL FEMORAL;  Surgeon: Sheral Apleyimothy D Murphy, MD;  Location: [facility] OR;  Service: Orthopedics;  Laterality: Right;     Review of Systems  Eyes:       Negative for changes in vision.  Respiratory: Negative for chest tightness.   Cardiovascular: Negative for chest pain, palpitations and leg swelling.      Objective:    BP 136/84 mmHg  Pulse 58  Temp(Src) 97.9 F (36.6 C) (Oral)  Resp 14  Ht 5'  4" (1.626 m)  Wt 115 lb (52.164 kg)  BMI 19.73 kg/m2  SpO2 95% Nursing note and vital signs reviewed.  Physical Exam  Constitutional: She is oriented to person, place, and time. She appears well-developed and well-nourished. No distress.  Elderly female seated in the chair with a walking stick in front of her. Dressed appropriately for situation. Appears thin. Answers questions appropriately.  Cardiovascular: Normal rate, regular rhythm, normal heart sounds and intact distal pulses.   Pulmonary/Chest: Effort normal and breath sounds normal.  Neurological: She is alert and oriented to person, place, and time.  Skin: Skin is warm and dry.  Psychiatric: She has a normal mood and affect. Her behavior is normal. Judgment and thought content normal.       Assessment & Plan:   Problem List Items Addressed This Visit      Cardiovascular and Mediastinum   Hypertension - Primary    Blood pressure remains well-controlled and below goal 140/90 with current regimen. Blood pressure is monitored at home periodically through living facility. Denies adverse side effects. Continue to monitor blood pressure at home. Continue current dosage of losartan. Follow-up in 6 months.      Relevant Medications   lovastatin (MEVACOR) 40 MG tablet   losartan (COZAAR) 50 MG tablet     Musculoskeletal and Integument   Fracture, intertrochanteric, right femur (HCC)   Relevant  Medications   meloxicam (MOBIC) 7.5 MG tablet     Other   Hyperlipidemia    Hyperlipidemia well controlled with current dosage of lovastatin and denies adverse effects or myalgias. Previous cholesterol well controlled. Continue current dosage of lovastatin. Check liver function in next office visit with routine blood work. Follow-up in 6 months.      Relevant Medications   lovastatin (MEVACOR) 40 MG tablet   losartan (COZAAR) 50 MG tablet

## 2015-08-14 NOTE — Assessment & Plan Note (Signed)
Hyperlipidemia well controlled with current dosage of lovastatin and denies adverse effects or myalgias. Previous cholesterol well controlled. Continue current dosage of lovastatin. Check liver function in next office visit with routine blood work. Follow-up in 6 months.

## 2015-08-14 NOTE — Patient Instructions (Signed)
Thank you for choosing [facility].  Summary/Instructions:  Your prescription(s) have been submitted to your pharmacy or been printed and provided for you. Please take as directed and contact our office if you believe you are having problem(s) with the medication(s) or have any questions.  If your symptoms worsen or fail to improve, please contact our office for further instruction, or in case of emergency go directly to the emergency room at the closest medical facility.   Please continue to take your medication as prescribed.    

## 2015-08-14 NOTE — Assessment & Plan Note (Signed)
Blood pressure remains well-controlled and below goal 140/90 with current regimen. Blood pressure is monitored at home periodically through living facility. Denies adverse side effects. Continue to monitor blood pressure at home. Continue current dosage of losartan. Follow-up in 6 months.

## 2015-08-14 NOTE — Progress Notes (Signed)
Pre visit review using our clinic review tool, if applicable. No additional management support is needed unless otherwise documented below in the visit note. 

## 2015-08-15 ENCOUNTER — Telehealth: Payer: Self-pay | Admitting: Family

## 2015-08-15 NOTE — Telephone Encounter (Signed)
Please advise 

## 2015-08-15 NOTE — Telephone Encounter (Signed)
Pt's daughter Suzette BattiestVeronica called stating pt's home aid told her the nurse at the organization is needing a letter stating pt has the onset of dimentia. She says that was talked about at her last appt back in May and that when asked what year she said a completely different year. She didn't have the agency's information and she said she will have the aid give us a call back with more details.

## 2015-08-16 NOTE — Telephone Encounter (Signed)
Will follow up when more information available.

## 2015-08-18 ENCOUNTER — Encounter: Payer: Self-pay | Admitting: Family

## 2015-08-18 NOTE — Telephone Encounter (Signed)
[facility] needs information that states that pt has on set dementia so the health aide will still come to her house.   [id number] [place] [id number]  [phone]

## 2015-08-18 NOTE — Telephone Encounter (Signed)
Letter printed.

## 2015-08-18 NOTE — Telephone Encounter (Signed)
MMSE - Mini Mental State Exam 01/29/2015  Orientation to time 3  Orientation to Place 5  Registration 3  Attention/ Calculation 5  Recall 3  Language- name 2 objects 2  Language- repeat 1  Language- follow 3 step command 3  Language- read & follow direction 1  Write a sentence 1  Copy design 1  Total score 28

## 2015-08-18 NOTE — Telephone Encounter (Signed)
Pt's daughter request to speak to the assistant, please give her a call back  Phone # [phone]

## 2015-08-19 NOTE — Telephone Encounter (Signed)
Letter sent. Daughter aware.

## 2015-10-02 ENCOUNTER — Telehealth: Payer: Self-pay | Admitting: Family

## 2015-10-02 NOTE — Telephone Encounter (Signed)
pts daughter called regarding pts bp has been up for the last 3 days and she is wondering if she needs to come in or do you want to change her meds

## 2015-10-02 NOTE — Telephone Encounter (Signed)
OV required? Please advise.

## 2015-10-02 NOTE — Telephone Encounter (Signed)
Please have her schedule a nurse visit to evaluate her blood pressure.

## 2015-10-03 NOTE — Telephone Encounter (Signed)
Called pt and told her to make a nurse visit. She states that her BP was only high for a time or 2 and it came back down to normal. She will call if it raises back up again.

## 2015-10-31 ENCOUNTER — Encounter: Payer: Self-pay | Admitting: Family

## 2015-10-31 ENCOUNTER — Ambulatory Visit (INDEPENDENT_AMBULATORY_CARE_PROVIDER_SITE_OTHER): Payer: Medicare Other | Admitting: Family

## 2015-10-31 VITALS — BP 140/98 | HR 76 | Temp 98.4°F | Resp 16 | Ht 64.0 in | Wt 110.0 lb

## 2015-10-31 DIAGNOSIS — R413 Other amnesia: Secondary | ICD-10-CM

## 2015-10-31 DIAGNOSIS — E440 Moderate protein-calorie malnutrition: Secondary | ICD-10-CM | POA: Diagnosis not present

## 2015-10-31 DIAGNOSIS — E785 Hyperlipidemia, unspecified: Secondary | ICD-10-CM

## 2015-10-31 DIAGNOSIS — I10 Essential (primary) hypertension: Secondary | ICD-10-CM | POA: Diagnosis not present

## 2015-10-31 MED ORDER — ENSURE NUTRITION SHAKE PO LIQD: 1.0000 | Freq: Every day | ORAL | Status: DC

## 2015-10-31 MED ORDER — LOSARTAN POTASSIUM 100 MG PO TABS: 100.0000 mg | ORAL_TABLET | Freq: Every day | ORAL | Status: DC

## 2015-10-31 NOTE — Assessment & Plan Note (Signed)
Obtain lipid profile to determine current status. Continue current dosage of lovastatin pending lipid profile results.

## 2015-10-31 NOTE — Patient Instructions (Addendum)
Thank you for choosing [facility].  Summary/Instructions:  Please increase losartan to 100 mg.   Follow up in the lab for blood work.  Continue to take other medications as prescribed.   Your prescription(s) have been submitted to your pharmacy or been printed and provided for you. Please take as directed and contact our office if you believe you are having problem(s) with the medication(s) or have any questions.  Please stop by the lab on the basement level of the building for your blood work. Your results will be released to MyChart (or called to you) after review, usually within 72 hours after test completion. If any changes need to be made, you will be notified at that same time.  If your symptoms worsen or fail to improve, please contact our office for further instruction, or in case of emergency go directly to the emergency room at the closest medical facility.

## 2015-10-31 NOTE — Progress Notes (Signed)
Subjective:    Patient ID: Kendra Nixon, female    DOB: 07-05-1941, 75 y.o.   MRN: [id number]  Chief Complaint  Patient presents with  . Hypertension    MRI was done and showed that she had 2 strokes     HPI:  Kendra Nixon is a 75 y.o. female who  has a past medical history of Hypertension; ACE-inhibitor cough; History of CVA (cerebrovascular accident); Solitary lung nodule; and Hyperlipidemia LDL goal < 100. and presents today for a follow up office visit.   1.) Hypertension - currently maintained on losartan. Takes medication as prescribed and denies adverse side effects. Blood pressure at home has been elevated recently labile and elevated. This is a worsening control of blood pressure.   BP Readings from Last 3 Encounters:  10/31/15 140/98  08/14/15 136/84  01/29/15 130/82    2.) MRI results -   Recently seen by opthalmology and noted to have glaucoma and changes in vision. MRI results showed a right subacute infarct in the left inferior occipital lobe; old infarcts in left basal ganglia and right caudate nucleus; and moderate cerebral atrophy. She currently has no residual effects. This is a worsening of previous stroke results. She was advised to follow up with primary care for risk factor reduction.   No Known Allergies   Current Outpatient Prescriptions on File Prior to Visit  Medication Sig Dispense Refill  . aspirin 81 MG tablet Take 81 mg by mouth daily.    Marland Kitchen lovastatin (MEVACOR) 40 MG tablet Take 1 tablet (40 mg total) by mouth at bedtime. 90 tablet 0  . meloxicam (MOBIC) 7.5 MG tablet Take 1 tablet (7.5 mg total) by mouth 2 (two) times daily. 180 tablet 0   No current facility-administered medications on file prior to visit.     Past Surgical History  Procedure Laterality Date  . Breast lumpectomy Bilateral age 66  . Femur im nail Right 08/16/2014    Procedure: INTRAMEDULLARY (IM) NAIL FEMORAL;  Surgeon: Sheral Apley, MD;  Location: [facility] OR;  Service:  Orthopedics;  Laterality: Right;     Review of Systems  Constitutional: Negative for fever and chills.  Eyes:       Positive for changes in vision.  Respiratory: Positive for shortness of breath. Negative for chest tightness.   Cardiovascular: Negative for chest pain, palpitations and leg swelling.  Psychiatric/Behavioral: Positive for confusion.      Objective:    BP 140/98 mmHg  Pulse 76  Temp(Src) 98.4 F (36.9 C) (Oral)  Resp 16  Ht  (1.626 m)  Wt 110 lb (49.896 kg)  BMI 18.87 kg/m2  SpO2 99% Nursing note and vital signs reviewed.   Physical Exam  Constitutional: She appears well-developed and well-nourished. No distress.  Cardiovascular: Normal rate, regular rhythm, normal heart sounds and intact distal pulses.   Pulmonary/Chest: Effort normal and breath sounds normal.  Neurological: She is alert. She has normal strength. She is disoriented. No cranial nerve deficit or sensory deficit.  Skin: Skin is warm and dry.  Psychiatric: She has a normal mood and affect. Her behavior is normal. Judgment and thought content normal.       Assessment & Plan:   Problem List Items Addressed This Visit      Cardiovascular and Mediastinum   Essential hypertension - Primary    Blood pressure remains labile with current regimen and above goal of 140/90 with today's blood pressure reading. Increase low losartan to 100 mg daily. Continue  to monitor blood pressure at home. Physical activity as able. Follow-up in one month.      Relevant Medications   losartan (COZAAR) 100 MG tablet   Other Relevant Orders   Lipid Profile   Basic Metabolic Panel (BMET)     Other   Hyperlipidemia with target LDL less than 100   Relevant Medications   losartan (COZAAR) 100 MG tablet   Other Relevant Orders   Lipid Profile   Malnutrition of moderate degree (HCC)    Continues to have decreased appetite with weight loss since recent office visit. Encouraged to consume frequent small meals and  the start nutritional supplement daily. Continue to monitor weight. Follow up if weight continues to decrease.       Memory impairment    MMSE of 19/29. Question possible dementia versus related to previous CVA. Will continue to monitor at this time and work towards stroke risk reduction. Hold off medication at this time. Follow up if symptoms worsen.

## 2015-10-31 NOTE — Progress Notes (Signed)
Pre visit review using our clinic review tool, if applicable. No additional management support is needed unless otherwise documented below in the visit note. 

## 2015-10-31 NOTE — Assessment & Plan Note (Signed)
Blood pressure remains labile with current regimen and above goal of 140/90 with today's blood pressure reading. Increase low losartan to 100 mg daily. Continue to monitor blood pressure at home. Physical activity as able. Follow-up in one month.

## 2015-10-31 NOTE — Assessment & Plan Note (Signed)
MMSE of 19/29. Question possible dementia versus related to previous CVA. Will continue to monitor at this time and work towards stroke risk reduction. Hold off medication at this time. Follow up if symptoms worsen.

## 2015-10-31 NOTE — Assessment & Plan Note (Signed)
Continues to have decreased appetite with weight loss since recent office visit. Encouraged to consume frequent small meals and the start nutritional supplement daily. Continue to monitor weight. Follow up if weight continues to decrease.

## 2015-11-17 ENCOUNTER — Telehealth: Payer: Self-pay | Admitting: Family

## 2015-11-17 NOTE — Telephone Encounter (Signed)
LVM with Gregs response below

## 2015-11-17 NOTE — Telephone Encounter (Signed)
Recommended to take Delsym or Robitussin. If no relief then we may need to see her.

## 2015-11-17 NOTE — Telephone Encounter (Signed)
Please advise 

## 2015-11-17 NOTE — Telephone Encounter (Signed)
Pt daughter called in and said that pt has a cough.  She has given her the over the counter cough med for high blood pressure but it is not helping.  Can there be something called in for her that could help?

## 2015-12-18 ENCOUNTER — Ambulatory Visit (INDEPENDENT_AMBULATORY_CARE_PROVIDER_SITE_OTHER): Payer: Medicare Other | Admitting: Family

## 2015-12-18 ENCOUNTER — Ambulatory Visit: Payer: Medicare Other | Admitting: Family

## 2015-12-18 ENCOUNTER — Other Ambulatory Visit (INDEPENDENT_AMBULATORY_CARE_PROVIDER_SITE_OTHER): Payer: Medicare Other

## 2015-12-18 ENCOUNTER — Encounter: Payer: Self-pay | Admitting: Family

## 2015-12-18 VITALS — BP 132/88 | HR 59 | Temp 97.6°F | Resp 16 | Ht 64.0 in | Wt 108.0 lb

## 2015-12-18 DIAGNOSIS — I10 Essential (primary) hypertension: Secondary | ICD-10-CM | POA: Diagnosis not present

## 2015-12-18 DIAGNOSIS — M1990 Unspecified osteoarthritis, unspecified site: Secondary | ICD-10-CM | POA: Diagnosis not present

## 2015-12-18 DIAGNOSIS — M1991 Primary osteoarthritis, unspecified site: Secondary | ICD-10-CM

## 2015-12-18 LAB — BASIC METABOLIC PANEL
BUN: 16 mg/dL (ref 6–23)
CHLORIDE: 104 meq/L (ref 96–112)
CO2: 31 mEq/L (ref 19–32)
CREATININE: 0.83 mg/dL (ref 0.40–1.20)
Calcium: 9.6 mg/dL (ref 8.4–10.5)
GFR: 85.72 mL/min (ref >60.00)
GLUCOSE: 87 mg/dL (ref 70–99)
Potassium: 4.1 mEq/L (ref 3.5–5.1)
Sodium: 140 mEq/L (ref 135–145)

## 2015-12-18 MED ORDER — LOSARTAN POTASSIUM 100 MG PO TABS: 100.0000 mg | ORAL_TABLET | Freq: Every day | ORAL | Status: DC

## 2015-12-18 NOTE — Assessment & Plan Note (Signed)
Hypertension is well-controlled with current regimen below goal of 140/90. Continue current dosage of losartan. Obtain basic metabolic panel check potassium levels and kidney function. Continue to monitor blood pressure at home as able. Follow-up in 3 months.

## 2015-12-18 NOTE — Progress Notes (Signed)
Pre visit review using our clinic review tool, if applicable. No additional management support is needed unless otherwise documented below in the visit note. 

## 2015-12-18 NOTE — Assessment & Plan Note (Addendum)
This is a worsening problem. Symptoms and exam consistent with osteoarthritis of right lower extremity most likely related to her previous surgery. Treat conservatively with ice/heat and home exercise therapy. Encouraged vitamin D intake. Tylenol as needed for discomfort. Continue meloxicam as needed for pain. Follow-up if symptoms worsen or do not improve.

## 2015-12-18 NOTE — Progress Notes (Signed)
Subjective:    Patient ID: Kendra Mouldingunice Nixon, female    DOB: 10-31-1940, 75 y.o.   MRN: [id number]  Chief Complaint  Patient presents with  . Follow-up    hypertension, right leg has been bothering her    HPI:  Kendra Nixon is a 75 y.o. female who  has a past medical history of Hypertension; ACE-inhibitor cough; History of CVA (cerebrovascular accident); Solitary lung nodule; and Hyperlipidemia LDL goal < 100. and presents today for a follow up office visit.   1.) Hypertension - Currently maintained on losartan. Reports taking the medication as prescribed and denies adverse side effects. Blood pressure readings at home have been below goal of 140/90 on average. No symptoms of end organ damage.   BP Readings from Last 3 Encounters:  12/18/15 132/88  10/31/15 140/98  08/14/15 136/84    2.) Right leg - Associated symptom of pain located in her right leg has been going on for the past several weeks. Pain is decreased as "hurting" that goes up and down her leg. Modifying factors include meloxicam which seems to help on occasion. Symptoms tend to wax and wane. Previous history of Intermeduallary nail located on the same side.    No Known Allergies   Current Outpatient Prescriptions on File Prior to Visit  Medication Sig Dispense Refill  . aspirin 81 MG tablet Take 81 mg by mouth daily.    Marland Kitchen. lovastatin (MEVACOR) 40 MG tablet Take 1 tablet (40 mg total) by mouth at bedtime. 90 tablet 0  . meloxicam (MOBIC) 7.5 MG tablet Take 1 tablet (7.5 mg total) by mouth 2 (two) times daily. 180 tablet 0  . Nutritional Supplements (ENSURE NUTRITION SHAKE) LIQD Take 1 Bottle by mouth daily. 90 Bottle 1   No current facility-administered medications on file prior to visit.    Past Medical History  Diagnosis Date  . Hypertension   . ACE-inhibitor cough   . History of CVA (cerebrovascular accident)     Old left basal ganglion lacunar infarct noted per CT head (01/2013)  . Solitary lung nodule     4  mm right upper lobe nodule  . Hyperlipidemia LDL goal < 100      Past Surgical History  Procedure Laterality Date  . Breast lumpectomy Bilateral age 75  . Femur im nail Right 08/16/2014    Procedure: INTRAMEDULLARY (IM) NAIL FEMORAL;  Surgeon: Sheral Apleyimothy D Murphy, MD;  Location: [facility] OR;  Service: Orthopedics;  Laterality: Right;      Review of Systems  Constitutional: Negative for fever and chills.  Eyes:       Negative for changes in vision  Respiratory: Negative for cough, chest tightness and wheezing.   Cardiovascular: Negative for chest pain, palpitations and leg swelling.  Musculoskeletal:       Positive for right lower extremity pain.  Neurological: Negative for dizziness, weakness and light-headedness.      Objective:    BP 132/88 mmHg  Pulse 59  Temp(Src) 97.6 F (36.4 C) (Oral)  Resp 16  Ht 5\' 4"  (1.626 m)  Wt 108 lb (48.988 kg)  BMI 18.53 kg/m2  SpO2 97% Nursing note and vital signs reviewed.   Physical Exam  Constitutional: She is oriented to person, place, and time. She appears well-developed and well-nourished. No distress.  Cardiovascular: Normal rate, regular rhythm, normal heart sounds and intact distal pulses.   Pulmonary/Chest: Effort normal and breath sounds normal.  Musculoskeletal:  Right leg - no obvious deformity, discoloration, or edema noted.  Mild tenderness along right forearm shaft and lateral hip. Range of motion is within normal limits. Strength is 4+ in all directions. Distal pulses and sensation are intact and appropriate.  Neurological: She is alert and oriented to person, place, and time.  Skin: Skin is warm and dry.  Psychiatric: She has a normal mood and affect. Her behavior is normal. Judgment and thought content normal.       Assessment & Plan:   Problem List Items Addressed This Visit      Cardiovascular and Mediastinum   Essential hypertension - Primary    Hypertension is well-controlled with current regimen below goal of  140/90. Continue current dosage of losartan. Obtain basic metabolic panel check potassium levels and kidney function. Continue to monitor blood pressure at home as able. Follow-up in 3 months.      Relevant Medications   losartan (COZAAR) 100 MG tablet   Other Relevant Orders   Basic Metabolic Panel (BMET) (Completed)     Musculoskeletal and Integument   Osteoarthritis of right lower extremity    This is a worsening problem. Symptoms and exam consistent with osteoarthritis of right lower extremity most likely related to her previous surgery. Treat conservatively with ice/heat and home exercise therapy. Encouraged vitamin D intake. Tylenol as needed for discomfort. Continue meloxicam as needed for pain. Follow-up if symptoms worsen or do not improve.          I am having Kendra Nixon maintain her aspirin, meloxicam, lovastatin, ENSURE NUTRITION SHAKE, and losartan.   Meds ordered this encounter  Medications  . losartan (COZAAR) 100 MG tablet    Sig: Take 1 tablet (100 mg total) by mouth daily.    Dispense:  90 tablet    Refill:  1    Order Specific Question:  Supervising Provider    Answer:  Hillard Danker A [[id number]]     Follow-up: Return in about 3 months (around 03/18/2016).  Jeanine Luz, FNP

## 2015-12-18 NOTE — Patient Instructions (Addendum)
Thank you for choosing [facility].  Summary/Instructions:  Please continue to take your medications as prescribed.  Try starting Vitamin D 2000 IU daily.  Tylenol - no more than 3000 mg daily.  Your prescription(s) have been submitted to your pharmacy or been printed and provided for you. Please take as directed and contact our office if you believe you are having problem(s) with the medication(s) or have any questions.  If your symptoms worsen or fail to improve, please contact our office for further instruction, or in case of emergency go directly to the emergency room at the closest medical facility.    Osteoarthritis Osteoarthritis is a disease that causes soreness and inflammation of a joint. It occurs when the cartilage at the affected joint wears down. Cartilage acts as a cushion, covering the ends of bones where they meet to form a joint. Osteoarthritis is the most common form of arthritis. It often occurs in older people. The joints affected most often by this condition include those in the:  Ends of the fingers.  Thumbs.  Neck.  Lower back.  Knees.  Hips. CAUSES  Over time, the cartilage that covers the ends of bones begins to wear away. This causes bone to rub on bone, producing pain and stiffness in the affected joints.  RISK FACTORS Certain factors can increase your chances of having osteoarthritis, including:  Older age.  Excessive body weight.  Overuse of joints.  Previous joint injury. SIGNS AND SYMPTOMS   Pain, swelling, and stiffness in the joint.  Over time, the joint may lose its normal shape.  Small deposits of bone (osteophytes) may grow on the edges of the joint.  Bits of bone or cartilage can break off and float inside the joint space. This may cause more pain and damage. DIAGNOSIS  Your health care provider will do a physical exam and ask about your symptoms. Various tests may be ordered, such as:  X-rays of the affected  joint.  Blood tests to rule out other types of arthritis. Additional tests may be used to diagnose your condition. TREATMENT  Goals of treatment are to control pain and improve joint function. Treatment plans may include:  A prescribed exercise program that allows for rest and joint relief.  A weight control plan.  Pain relief techniques, such as:  Properly applied heat and cold.  Electric pulses delivered to nerve endings under the skin (transcutaneous electrical nerve stimulation [TENS]).  Massage.  Certain nutritional supplements.  Medicines to control pain, such as:  Acetaminophen.  Nonsteroidal anti-inflammatory drugs (NSAIDs), such as naproxen.  Narcotic or central-acting agents, such as tramadol.  Corticosteroids. These can be given orally or as an injection.  Surgery to reposition the bones and relieve pain (osteotomy) or to remove loose pieces of bone and cartilage. Joint replacement may be needed in advanced states of osteoarthritis. HOME CARE INSTRUCTIONS   Take medicines only as directed by your health care provider.  Maintain a healthy weight. Follow your health care provider's instructions for weight control. This may include dietary instructions.  Exercise as directed. Your health care provider can recommend specific types of exercise. These may include:  Strengthening exercises. These are done to strengthen the muscles that support joints affected by arthritis. They can be performed with weights or with exercise bands to add resistance.  Aerobic activities. These are exercises, such as brisk walking or low-impact aerobics, that get your heart pumping.  Range-of-motion activities. These keep your joints limber.  Balance and agility exercises. These  help you maintain daily living skills.  Rest your affected joints as directed by your health care provider.  Keep all follow-up visits as directed by your health care provider. SEEK MEDICAL CARE IF:    Your skin turns red.  You develop a rash in addition to your joint pain.  You have worsening joint pain.  You have a fever along with joint or muscle aches. SEEK IMMEDIATE MEDICAL CARE IF:  You have a significant loss of weight or appetite.  You have night sweats. FOR MORE INFORMATION   National Institute of Arthritis and Musculoskeletal and Skin Diseases: www.niams.http://www.myers.net/nih.gov  [employer] Aging: https://walker.com/www.nia.nih.gov  American College of Rheumatology: www.rheumatology.org   This information is not intended to replace advice given to you by your health care provider. Make sure you discuss any questions you have with your health care provider.   Document Released: 08/23/2005 Document Revised: 09/13/2014 Document Reviewed: 04/30/2013 Elsevier Interactive Patient Education [employer]2016 Elsevier Inc.

## 2015-12-21 ENCOUNTER — Telehealth: Payer: Self-pay | Admitting: Family

## 2015-12-21 NOTE — Telephone Encounter (Signed)
Please inform patient that her kidney function and electrolytes are within the normal ranges. Please continue to take her medications as prescribed.

## 2015-12-22 NOTE — Telephone Encounter (Signed)
Spoke with Pts daughter to inform.

## 2016-02-13 ENCOUNTER — Other Ambulatory Visit: Payer: Self-pay | Admitting: Family

## 2016-03-23 ENCOUNTER — Ambulatory Visit (INDEPENDENT_AMBULATORY_CARE_PROVIDER_SITE_OTHER): Payer: Medicare Other | Admitting: Family

## 2016-03-23 ENCOUNTER — Encounter: Payer: Self-pay | Admitting: Family

## 2016-03-23 VITALS — BP 138/84 | HR 67 | Temp 97.9°F | Wt 109.1 lb

## 2016-03-23 DIAGNOSIS — I10 Essential (primary) hypertension: Secondary | ICD-10-CM | POA: Diagnosis not present

## 2016-03-23 NOTE — Progress Notes (Signed)
Pre visit review using our clinic review tool, if applicable. No additional management support is needed unless otherwise documented below in the visit note. 

## 2016-03-23 NOTE — Patient Instructions (Addendum)
Thank you for choosing [facility].  Summary/Instructions:  Continue to take your medication as prescribed.   Monitor your blood pressure at home as able.  Talk with the eye surgeons regarding blood pressure.  Your prescription(s) have been submitted to your pharmacy or been printed and provided for you. Please take as directed and contact our office if you believe you are having problem(s) with the medication(s) or have any questions.  If your symptoms worsen or fail to improve, please contact our office for further instruction, or in case of emergency go directly to the emergency room at the closest medical facility.   DASH Eating Plan DASH stands for "Dietary Approaches to Stop Hypertension." The DASH eating plan is a healthy eating plan that has been shown to reduce high blood pressure (hypertension). Additional health benefits may include reducing the risk of type 2 diabetes mellitus, heart disease, and stroke. The DASH eating plan may also help with weight loss. WHAT DO I NEED TO KNOW ABOUT THE DASH EATING PLAN? For the DASH eating plan, you will follow these general guidelines:  Choose foods with a percent daily value for sodium of less than 5% (as listed on the food label).  Use salt-free seasonings or herbs instead of table salt or sea salt.  Check with your health care provider or pharmacist before using salt substitutes.  Eat lower-sodium products, often labeled as "lower sodium" or "no salt added."  Eat fresh foods.  Eat more vegetables, fruits, and low-fat dairy products.  Choose whole grains. Look for the word "whole" as the first word in the ingredient list.  Choose fish and skinless chicken or [place] more often than red meat. Limit fish, poultry, and meat to 6 oz (170 g) each day.  Limit sweets, desserts, sugars, and sugary drinks.  Choose heart-healthy fats.  Limit cheese to 1 oz (28 g) per day.  Eat more home-cooked food and less restaurant, buffet,  and fast food.  Limit fried foods.  Cook foods using methods other than frying.  Limit canned vegetables. If you do use them, rinse them well to decrease the sodium.  When eating at a restaurant, ask that your food be prepared with less salt, or no salt if possible. WHAT FOODS CAN I EAT? Seek help from a dietitian for individual calorie needs. Grains Whole grain or whole wheat bread. Brown rice. Whole grain or whole wheat pasta. Quinoa, bulgur, and whole grain cereals. Low-sodium cereals. Corn or whole wheat flour tortillas. Whole grain cornbread. Whole grain crackers. Low-sodium crackers. Vegetables Fresh or frozen vegetables (raw, steamed, roasted, or grilled). Low-sodium or reduced-sodium tomato and vegetable juices. Low-sodium or reduced-sodium tomato sauce and paste. Low-sodium or reduced-sodium canned vegetables.  Fruits All fresh, canned (in natural juice), or frozen fruits. Meat and Other Protein Products Ground beef (85% or leaner), grass-fed beef, or beef trimmed of fat. Skinless chicken or [place]key. Ground chicken or [place]key. Pork trimmed of fat. All fish and seafood. Eggs. Dried beans, peas, or lentils. Unsalted nuts and seeds. Unsalted canned beans. Dairy Low-fat dairy products, such as skim or 1% milk, 2% or reduced-fat cheeses, low-fat ricotta or cottage cheese, or plain low-fat yogurt. Low-sodium or reduced-sodium cheeses. Fats and Oils Tub margarines without trans fats. Light or reduced-fat mayonnaise and salad dressings (reduced sodium). Avocado. Safflower, olive, or canola oils. Natural peanut or almond butter. Other Unsalted popcorn and pretzels. The items listed above may not be a complete list of recommended foods or beverages. Contact your dietitian for more  options. WHAT FOODS ARE NOT RECOMMENDED? Grains White bread. White pasta. White rice. Refined cornbread. Bagels and croissants. Crackers that contain trans fat. Vegetables Creamed or fried vegetables. Vegetables  in a cheese sauce. Regular canned vegetables. Regular canned tomato sauce and paste. Regular tomato and vegetable juices. Fruits Dried fruits. Canned fruit in light or heavy syrup. Fruit juice. Meat and Other Protein Products Fatty cuts of meat. Ribs, chicken wings, bacon, sausage, bologna, salami, chitterlings, fatback, hot dogs, bratwurst, and packaged luncheon meats. Salted nuts and seeds. Canned beans with salt. Dairy Whole or 2% milk, cream, half-and-half, and cream cheese. Whole-fat or sweetened yogurt. Full-fat cheeses or blue cheese. Nondairy creamers and whipped toppings. Processed cheese, cheese spreads, or cheese curds. Condiments Onion and garlic salt, seasoned salt, table salt, and sea salt. Canned and packaged gravies. Worcestershire sauce. Tartar sauce. Barbecue sauce. Teriyaki sauce. Soy sauce, including reduced sodium. Steak sauce. Fish sauce. Oyster sauce. Cocktail sauce. Horseradish. Ketchup and mustard. Meat flavorings and tenderizers. Bouillon cubes. Hot sauce. Tabasco sauce. Marinades. Taco seasonings. Relishes. Fats and Oils Butter, stick margarine, lard, shortening, ghee, and bacon fat. Coconut, palm kernel, or palm oils. Regular salad dressings. Other Pickles and olives. Salted popcorn and pretzels. The items listed above may not be a complete list of foods and beverages to avoid. Contact your dietitian for more information. WHERE CAN I FIND MORE INFORMATION? National Heart, Lung, and Blood Institute: CablePromo.it   This information is not intended to replace advice given to you by your health care provider. Make sure you discuss any questions you have with your health care provider.   Document Released: 08/12/2011 Document Revised: 09/13/2014 Document Reviewed: 06/27/2013 Elsevier Interactive Patient Education [employer]

## 2016-03-23 NOTE — Assessment & Plan Note (Signed)
Blood pressure is below goal 140/90 with current regimen and no adverse side effects. Denies any symptoms of end organ damage or worse headache of life. Encouraged to follow a low-sodium diet. Continue current dosage of losartan. Continue to monitor blood pressure at home. Follow-up in 6 months or sooner if needed.

## 2016-03-23 NOTE — Progress Notes (Signed)
   Subjective:    Patient ID: Kendra Nixon, female    DOB: Jun 05, 1941, 75 y.o.   MRN: [id number]  Chief Complaint  Patient presents with  . Follow-up    3 month f/u    HPI:  Kendra Nixon is a 75 y.o. female who  has a past medical history of Hypertension; ACE-inhibitor cough; History of CVA (cerebrovascular accident); Solitary lung nodule; and Hyperlipidemia LDL goal < 100. and presents today for a follow up office visit.   1.) Hypertension - This is a chronic problem. Currently maintained on losartan. Reports taking the medication as prescribed and denies adverse side effects or hypotensive symptoms. Denies worst headache of life or new symptoms of end organ damage. Did recently have eye surgery and noted that her blood pressure was elevated, however she does not recall what the reading is. Not currently following a low sodium diet.   BP Readings from Last 3 Encounters:  03/23/16 138/84  12/18/15 132/88  10/31/15 140/98    No Known Allergies   Current Outpatient Prescriptions on File Prior to Visit  Medication Sig Dispense Refill  . aspirin 81 MG tablet Take 81 mg by mouth daily.    Marland Kitchen. losartan (COZAAR) 100 MG tablet Take 1 tablet (100 mg total) by mouth daily. 90 tablet 1  . lovastatin (MEVACOR) 40 MG tablet TAKE ONE TABLET BY MOUTH AT BEDTIME 90 tablet 3  . meloxicam (MOBIC) 7.5 MG tablet Take 1 tablet (7.5 mg total) by mouth 2 (two) times daily. 180 tablet 0  . Nutritional Supplements (ENSURE NUTRITION SHAKE) LIQD Take 1 Bottle by mouth daily. 90 Bottle 1   No current facility-administered medications on file prior to visit.    Review of Systems  Constitutional: Negative for fever and chills.  Eyes:       Negative for changes in vision  Respiratory: Negative for cough, chest tightness and wheezing.   Cardiovascular: Negative for chest pain, palpitations and leg swelling.  Neurological: Negative for dizziness, weakness and light-headedness.      Objective:    BP  138/84 mmHg  Pulse 67  Temp(Src) 97.9 F (36.6 C) (Oral)  Wt 109 lb 1.9 oz (49.497 kg)  SpO2 96% Nursing note and vital signs reviewed.  Physical Exam  Constitutional: She is oriented to person, place, and time. She appears well-developed and well-nourished. No distress.  Cardiovascular: Normal rate, regular rhythm, normal heart sounds and intact distal pulses.   Pulmonary/Chest: Effort normal and breath sounds normal.  Neurological: She is alert and oriented to person, place, and time.  Skin: Skin is warm and dry.  Psychiatric: She has a normal mood and affect. Her behavior is normal. Judgment and thought content normal.       Assessment & Plan:   Problem List Items Addressed This Visit      Cardiovascular and Mediastinum   Essential hypertension - Primary    Blood pressure is below goal 140/90 with current regimen and no adverse side effects. Denies any symptoms of end organ damage or worse headache of life. Encouraged to follow a low-sodium diet. Continue current dosage of losartan. Continue to monitor blood pressure at home. Follow-up in 6 months or sooner if needed.          I am having Kendra Nixon maintain her aspirin, meloxicam, ENSURE NUTRITION SHAKE, losartan, and lovastatin.   Follow-up: Return if symptoms worsen or fail to improve.  Jeanine Luzalone, Alithea Lapage, FNP

## 2016-04-10 ENCOUNTER — Other Ambulatory Visit: Payer: Self-pay | Admitting: Family

## 2016-04-10 DIAGNOSIS — S72141S Displaced intertrochanteric fracture of right femur, sequela: Secondary | ICD-10-CM

## 2016-04-12 NOTE — Telephone Encounter (Signed)
Last fill 08/14/2015, last OV 03/23/16, please advise.

## 2016-08-12 ENCOUNTER — Encounter: Payer: Self-pay | Admitting: Family

## 2016-08-12 ENCOUNTER — Ambulatory Visit (INDEPENDENT_AMBULATORY_CARE_PROVIDER_SITE_OTHER): Payer: Medicare Other | Admitting: Family

## 2016-08-12 VITALS — BP 142/90 | HR 57 | Temp 97.9°F | Resp 16 | Ht 64.0 in | Wt 113.0 lb

## 2016-08-12 DIAGNOSIS — Z23 Encounter for immunization: Secondary | ICD-10-CM

## 2016-08-12 DIAGNOSIS — E440 Moderate protein-calorie malnutrition: Secondary | ICD-10-CM | POA: Diagnosis not present

## 2016-08-12 DIAGNOSIS — I10 Essential (primary) hypertension: Secondary | ICD-10-CM | POA: Diagnosis not present

## 2016-08-12 MED ORDER — LOSARTAN POTASSIUM-HCTZ 100-12.5 MG PO TABS: 1.0000 | ORAL_TABLET | Freq: Every day | ORAL | 3 refills | Status: DC

## 2016-08-12 MED ORDER — ENSURE COMPLETE PO LIQD: 237.0000 mL | Freq: Two times a day (BID) | ORAL | 1 refills | Status: DC

## 2016-08-12 NOTE — Patient Instructions (Addendum)
Thank you for choosing [facility].  SUMMARY AND INSTRUCTIONS:  Please continue to monitor your blood pressure at home.   Follow a low sodium diet.  The Ensure has been sent to your pharmacy.  Medication:  Please complete losartan that you have and then start LOSARTAN-HCTZ which is at your pharmacy.  Please continue to take your medication as prescribed.  Your prescription(s) have been submitted to your pharmacy or been printed and provided for you. Please take as directed and contact our office if you believe you are having problem(s) with the medication(s) or have any questions.   Follow up:  If your symptoms worsen or fail to improve, please contact our office for further instruction, or in case of emergency go directly to the emergency room at the closest medical facility.

## 2016-08-12 NOTE — Assessment & Plan Note (Signed)
Blood pressure at home appears adequately controlled with in office readings continuing to be high. Denies worst headache of life with no new symptoms of end organ damage noted on physical exam. Change losartan to losartan-hctz. Continue to monitor blood pressure at home and follow low sodium diet.

## 2016-08-12 NOTE — Progress Notes (Signed)
Subjective:    Patient ID: Kendra Nixon, female    DOB: 1941-07-24, 75 y.o.   MRN: [id number]  Chief Complaint  Patient presents with  . Follow-up    hypertension    HPI:  Kendra Nixon is a 75 y.o. female who  has a past medical history of ACE-inhibitor cough; History of CVA (cerebrovascular accident); Hyperlipidemia LDL goal < 100; Hypertension; and Solitary lung nodule. and presents today for a follow up office visit.   Hypertension - Currently maintained on losartan. Reports taking her medications as prescribed and denies adverse side effects. Denies worst headache of life or new symptoms of end organ damage. Not currently following a low sodium diet. Blood pressures at home remain slightly elevated.   BP Readings from Last 3 Encounters:  08/12/16 (!) 142/90  03/23/16 138/84  12/18/15 132/88    No Known Allergies    Outpatient Medications Prior to Visit  Medication Sig Dispense Refill  . aspirin 81 MG tablet Take 81 mg by mouth daily.    Marland Kitchen. losartan (COZAAR) 100 MG tablet Take 1 tablet (100 mg total) by mouth daily. 90 tablet 1  . lovastatin (MEVACOR) 40 MG tablet TAKE ONE TABLET BY MOUTH AT BEDTIME 90 tablet 3  . meloxicam (MOBIC) 7.5 MG tablet TAKE ONE TABLET BY MOUTH TWICE DAILY 180 tablet 0  . Nutritional Supplements (ENSURE NUTRITION SHAKE) LIQD Take 1 Bottle by mouth daily. 90 Bottle 1   No facility-administered medications prior to visit.      Review of Systems  Constitutional: Negative for chills and fever.  Eyes:       Negative for changes in vision  Respiratory: Negative for cough, chest tightness and wheezing.   Cardiovascular: Negative for chest pain, palpitations and leg swelling.  Neurological: Negative for dizziness, weakness and light-headedness.      Objective:    BP (!) 142/90   Pulse (!) 57   Temp 97.9 F (36.6 C) (Oral)   Resp 16   Ht 5\' 4"  (1.626 m)   Wt 113 lb (51.3 kg)   SpO2 90%   BMI 19.40 kg/m  Nursing note and vital signs  reviewed.  Physical Exam  Constitutional: She is oriented to person, place, and time. She appears cachectic.  Non-toxic appearance. She does not have a sickly appearance. No distress.  Cardiovascular: Normal rate, regular rhythm, normal heart sounds and intact distal pulses.  Exam reveals no gallop and no friction rub.   No murmur heard. Pulmonary/Chest: Effort normal and breath sounds normal. No respiratory distress. She has no wheezes. She has no rales. She exhibits no tenderness.  Neurological: She is alert and oriented to person, place, and time.  Skin: Skin is warm and dry.  Psychiatric: She has a normal mood and affect. Her behavior is normal. Judgment and thought content normal.       Assessment & Plan:   Problem List Items Addressed This Visit      Cardiovascular and Mediastinum   Essential hypertension - Primary    Blood pressure at home appears adequately controlled with in office readings continuing to be high. Denies worst headache of life with no new symptoms of end organ damage noted on physical exam. Change losartan to losartan-hctz. Continue to monitor blood pressure at home and follow low sodium diet.       Relevant Medications   losartan-hydrochlorothiazide (HYZAAR) 100-12.5 MG tablet     Other   Malnutrition of moderate degree (HCC)    Reports eating what  she can and trying to use the Ensure. Weight is up 5 pounds which is good. Encouraged small frequent meals and continue Ensure with prescription sent to pharmacy. Continue to monitor.        Relevant Medications   feeding supplement, ENSURE COMPLETE, (ENSURE COMPLETE) LIQD    Other Visit Diagnoses    Encounter for immunization       Relevant Orders   Flu vaccine HIGH DOSE PF (Completed)       I have discontinued [name]'s ENSURE NUTRITION SHAKE. I am also having her start on losartan-hydrochlorothiazide and feeding supplement (ENSURE COMPLETE). Additionally, I am having her maintain her aspirin,  losartan, lovastatin, and meloxicam.   Meds ordered this encounter  Medications  . losartan-hydrochlorothiazide (HYZAAR) 100-12.5 MG tablet    Sig: Take 1 tablet by mouth daily.    Dispense:  90 tablet    Refill:  3    Do not fill until patient request refill.    Order Specific Question:   Supervising Provider    Answer:   CRAWFORD, ELIZABETH A [[id number]]  . feeding supplement, ENSURE COMPLETE, (ENSURE COMPLETE) LIQD    Sig: Take 237 mLs by mouth 2 (two) times daily between meals.    Dispense:  5688 mL    Refill:  1    Order Specific Question:   Supervising Provider    Answer:   Hillard DankerRAWFORD, ELIZABETH A [[id number]]     Follow-up: No Follow-up on file.  Jeanine Luzalone, Gregory, FNP

## 2016-08-12 NOTE — Assessment & Plan Note (Signed)
Reports eating what she can and trying to use the Ensure. Weight is up 5 pounds which is good. Encouraged small frequent meals and continue Ensure with prescription sent to pharmacy. Continue to monitor.

## 2016-08-31 ENCOUNTER — Encounter (HOSPITAL_COMMUNITY): Payer: Self-pay

## 2016-08-31 ENCOUNTER — Observation Stay (HOSPITAL_COMMUNITY): Payer: Medicare Other

## 2016-08-31 ENCOUNTER — Emergency Department (HOSPITAL_COMMUNITY): Payer: Medicare Other

## 2016-08-31 ENCOUNTER — Telehealth: Payer: Self-pay | Admitting: Family

## 2016-08-31 ENCOUNTER — Inpatient Hospital Stay (HOSPITAL_COMMUNITY)
Admission: EM | Admit: 2016-08-31 | Discharge: 2016-09-03 | DRG: 064 | Disposition: A | Discharge status: Skilled Nursing Facility | Payer: Medicare Other | Attending: Internal Medicine | Admitting: Internal Medicine

## 2016-08-31 DIAGNOSIS — Z820 Family history of epilepsy and other diseases of the nervous system: Secondary | ICD-10-CM

## 2016-08-31 DIAGNOSIS — R001 Bradycardia, unspecified: Secondary | ICD-10-CM | POA: Diagnosis present

## 2016-08-31 DIAGNOSIS — N390 Urinary tract infection, site not specified: Secondary | ICD-10-CM

## 2016-08-31 DIAGNOSIS — I10 Essential (primary) hypertension: Secondary | ICD-10-CM | POA: Diagnosis present

## 2016-08-31 DIAGNOSIS — Z8673 Personal history of transient ischemic attack (TIA), and cerebral infarction without residual deficits: Secondary | ICD-10-CM | POA: Diagnosis not present

## 2016-08-31 DIAGNOSIS — Z681 Body mass index (BMI) 19 or less, adult: Secondary | ICD-10-CM

## 2016-08-31 DIAGNOSIS — Z7982 Long term (current) use of aspirin: Secondary | ICD-10-CM

## 2016-08-31 DIAGNOSIS — I160 Hypertensive urgency: Secondary | ICD-10-CM | POA: Diagnosis present

## 2016-08-31 DIAGNOSIS — R410 Disorientation, unspecified: Secondary | ICD-10-CM | POA: Diagnosis not present

## 2016-08-31 DIAGNOSIS — G9341 Metabolic encephalopathy: Secondary | ICD-10-CM | POA: Diagnosis present

## 2016-08-31 DIAGNOSIS — Z803 Family history of malignant neoplasm of breast: Secondary | ICD-10-CM

## 2016-08-31 DIAGNOSIS — I2510 Atherosclerotic heart disease of native coronary artery without angina pectoris: Secondary | ICD-10-CM | POA: Diagnosis present

## 2016-08-31 DIAGNOSIS — R29810 Facial weakness: Secondary | ICD-10-CM | POA: Diagnosis present

## 2016-08-31 DIAGNOSIS — I639 Cerebral infarction, unspecified: Secondary | ICD-10-CM | POA: Diagnosis not present

## 2016-08-31 DIAGNOSIS — R32 Unspecified urinary incontinence: Secondary | ICD-10-CM | POA: Diagnosis present

## 2016-08-31 DIAGNOSIS — Z87891 Personal history of nicotine dependence: Secondary | ICD-10-CM

## 2016-08-31 DIAGNOSIS — E785 Hyperlipidemia, unspecified: Secondary | ICD-10-CM | POA: Diagnosis present

## 2016-08-31 DIAGNOSIS — G936 Cerebral edema: Secondary | ICD-10-CM | POA: Diagnosis present

## 2016-08-31 DIAGNOSIS — Z8249 Family history of ischemic heart disease and other diseases of the circulatory system: Secondary | ICD-10-CM

## 2016-08-31 DIAGNOSIS — F0390 Unspecified dementia without behavioral disturbance: Secondary | ICD-10-CM | POA: Diagnosis present

## 2016-08-31 DIAGNOSIS — I6381 Other cerebral infarction due to occlusion or stenosis of small artery: Secondary | ICD-10-CM

## 2016-08-31 DIAGNOSIS — E440 Moderate protein-calorie malnutrition: Secondary | ICD-10-CM | POA: Diagnosis present

## 2016-08-31 DIAGNOSIS — Z8261 Family history of arthritis: Secondary | ICD-10-CM

## 2016-08-31 DIAGNOSIS — R4781 Slurred speech: Secondary | ICD-10-CM | POA: Diagnosis present

## 2016-08-31 HISTORY — DX: Urinary tract infection, site not specified: N39.0

## 2016-08-31 HISTORY — DX: Cerebral infarction, unspecified: I63.9

## 2016-08-31 HISTORY — DX: Unspecified osteoarthritis, unspecified site: M19.90

## 2016-08-31 HISTORY — DX: Unspecified dementia, unspecified severity, without behavioral disturbance, psychotic disturbance, mood disturbance, and anxiety: F03.90

## 2016-08-31 LAB — I-STAT CG4 LACTIC ACID, ED
Lactic Acid, Venous: 2.02 mmol/L (ref 0.5–1.9)
Lactic Acid, Venous: 1.62 mmol/L (ref 0.5–1.9)

## 2016-08-31 LAB — URINALYSIS, ROUTINE W REFLEX MICROSCOPIC
Bilirubin Urine: NEGATIVE
Glucose, UA: NEGATIVE mg/dL
Hgb urine dipstick: NEGATIVE
Ketones, ur: NEGATIVE mg/dL
Leukocytes, UA: NEGATIVE
Nitrite: NEGATIVE
Protein, ur: NEGATIVE mg/dL
Specific Gravity, Urine: 1.02 (ref 1.005–1.030)
pH: 5 (ref 5.0–8.0)

## 2016-08-31 LAB — COMPREHENSIVE METABOLIC PANEL
ALT: 14 U/L (ref 14–54)
AST: 29 U/L (ref 15–41)
Albumin: 3.8 g/dL (ref 3.5–5.0)
Alkaline Phosphatase: 60 U/L (ref 38–126)
Anion gap: 11 (ref 5–15)
BUN: 15 mg/dL (ref 6–20)
CO2: 22 mmol/L (ref 22–32)
Calcium: 9.7 mg/dL (ref 8.9–10.3)
Chloride: 105 mmol/L (ref 101–111)
Creatinine, Ser: 0.91 mg/dL (ref 0.44–1.00)
GFR calc Af Amer: >60 mL/min (ref >60)
GFR calc non Af Amer: 59 mL/min — ABNORMAL LOW (ref >60)
Glucose, Bld: 78 mg/dL (ref 65–99)
Potassium: 4 mmol/L (ref 3.5–5.1)
Sodium: 138 mmol/L (ref 135–145)
Total Bilirubin: 1.1 mg/dL (ref 0.3–1.2)
Total Protein: 6.9 g/dL (ref 6.5–8.1)

## 2016-08-31 LAB — PROTIME-INR
INR: 0.98
Prothrombin Time: 12.9 seconds (ref 11.4–15.2)

## 2016-08-31 LAB — CBC
HCT: 38.8 % (ref 36.0–46.0)
Hemoglobin: 12.8 g/dL (ref 12.0–15.0)
MCH: 27.5 pg (ref 26.0–34.0)
MCHC: 33 g/dL (ref 30.0–36.0)
MCV: 83.3 fL (ref 78.0–100.0)
Platelets: 163 10*3/uL (ref 150–400)
RBC: 4.66 MIL/uL (ref 3.87–5.11)
RDW: 14.8 % (ref 11.5–15.5)
WBC: 4.3 10*3/uL (ref 4.0–10.5)

## 2016-08-31 LAB — PHOSPHORUS: PHOSPHORUS: 3 mg/dL (ref 2.5–4.6)

## 2016-08-31 LAB — MAGNESIUM: Magnesium: 1.7 mg/dL (ref 1.7–2.4)

## 2016-08-31 MED ORDER — LABETALOL HCL 5 MG/ML IV SOLN
10.0000 mg | Freq: Once | INTRAVENOUS | Status: DC
  Filled 2016-08-31: qty 4

## 2016-08-31 MED ORDER — LORAZEPAM 2 MG/ML IJ SOLN
1.0000 mg | Freq: Once | INTRAMUSCULAR | Status: AC
  Administered 2016-09-01: 1 mg via INTRAVENOUS
  Filled 2016-08-31: qty 1

## 2016-08-31 MED ORDER — ONDANSETRON HCL 4 MG/2ML IJ SOLN: 4.0000 mg | Freq: Four times a day (QID) | INTRAMUSCULAR | Status: DC | PRN

## 2016-08-31 MED ORDER — ACETAMINOPHEN 650 MG RE SUPP: 650.0000 mg | Freq: Four times a day (QID) | RECTAL | Status: DC | PRN

## 2016-08-31 MED ORDER — SODIUM CHLORIDE 0.9 % IV SOLN
INTRAVENOUS | Status: AC
  Administered 2016-08-31 – 2016-09-01 (×2): via INTRAVENOUS

## 2016-08-31 MED ORDER — ENOXAPARIN SODIUM 40 MG/0.4ML ~~LOC~~ SOLN: 40.0000 mg | SUBCUTANEOUS | Status: DC

## 2016-08-31 MED ORDER — HYDRALAZINE HCL 20 MG/ML IJ SOLN: 10.0000 mg | INTRAMUSCULAR | Status: DC | PRN

## 2016-08-31 MED ORDER — LORAZEPAM 2 MG/ML IJ SOLN
0.5000 mg | Freq: Once | INTRAMUSCULAR | Status: AC
  Administered 2016-08-31: 0.5 mg via INTRAVENOUS
  Filled 2016-08-31: qty 1

## 2016-08-31 MED ORDER — ONDANSETRON HCL 4 MG PO TABS: 4.0000 mg | ORAL_TABLET | Freq: Four times a day (QID) | ORAL | Status: DC | PRN

## 2016-08-31 MED ORDER — STROKE: EARLY STAGES OF RECOVERY BOOK
Freq: Once | Status: DC
  Filled 2016-08-31: qty 1

## 2016-08-31 MED ORDER — ASPIRIN 325 MG PO TABS
325.0000 mg | ORAL_TABLET | Freq: Every day | ORAL | Status: DC
  Administered 2016-09-02: 325 mg via ORAL
  Filled 2016-08-31: qty 1

## 2016-08-31 MED ORDER — ASPIRIN 300 MG RE SUPP
300.0000 mg | Freq: Every day | RECTAL | Status: DC
  Administered 2016-09-01: 300 mg via RECTAL
  Filled 2016-08-31: qty 1

## 2016-08-31 MED ORDER — SODIUM CHLORIDE 0.9 % IV SOLN: INTRAVENOUS | Status: DC

## 2016-08-31 MED ORDER — HALOPERIDOL LACTATE 5 MG/ML IJ SOLN
2.0000 mg | Freq: Once | INTRAMUSCULAR | Status: AC
  Administered 2016-08-31: 2 mg via INTRAVENOUS
  Filled 2016-08-31: qty 1

## 2016-08-31 MED ORDER — ACETAMINOPHEN 325 MG PO TABS: 650.0000 mg | ORAL_TABLET | Freq: Four times a day (QID) | ORAL | Status: DC | PRN

## 2016-08-31 MED ORDER — ENOXAPARIN SODIUM 40 MG/0.4ML ~~LOC~~ SOLN
40.0000 mg | SUBCUTANEOUS | Status: DC
  Administered 2016-08-31 – 2016-09-02 (×3): 40 mg via SUBCUTANEOUS
  Filled 2016-08-31 (×3): qty 0.4

## 2016-08-31 MED ORDER — SODIUM CHLORIDE 0.9% FLUSH
3.0000 mL | Freq: Two times a day (BID) | INTRAVENOUS | Status: DC
  Administered 2016-08-31 – 2016-09-02 (×5): 3 mL via INTRAVENOUS

## 2016-08-31 NOTE — Telephone Encounter (Signed)
Patient Name: Kendra Nixon  DOB: June 14, 1941    Initial Comment Caller states her mother was confused and disoriented yesterday and still continuing today   Nurse Assessment  Nurse: Scarlette ArStandifer, RN, Heather Date/Time (Eastern Time): 08/31/2016 8:40:18 AM  Confirm and document reason for call. If symptomatic, describe symptoms. ---Caller states her mother was confused and disoriented yesterday and still continuing today  Does the patient have any new or worsening symptoms? ---Yes  Will a triage be completed? ---Yes  Related visit to physician within the last 2 weeks? ---No  Does the PT have any chronic conditions? (i.e. diabetes, asthma, etc.) ---Yes  List chronic conditions. ---See MR  Is this a behavioral health or substance abuse call? ---No     Guidelines    Guideline Title Affirmed Question Affirmed Notes  Confusion - Delirium Bizarre or paranoid behavior    Final Disposition User   Go to ED Now Standifer, RN, Avery DennisonHeather    [facility]   Disagree/Comply: Comply

## 2016-08-31 NOTE — Progress Notes (Signed)
[facility]

## 2016-08-31 NOTE — Progress Notes (Signed)
Kendra Nixon is a 75 y.o. female patient admitted from ED with confusion disoriented x4 .  VSS - Blood pressure (!) 163/71, pulse (!) 49, temperature 97.9 F (36.6 C), temperature source Oral, resp. rate 18, SpO2 100 %.    IV in place, occlusive dsg intact without redness. Family with patient Orientation to room, and floor completed with information packet given to family.  Admission INP armband ID verified with patient/family, and in place.   SR up x 2, fall assessment complete, with patient and family able to verbalize understanding of risk associated with falls, and verbalized understanding to call nsg before up out of bed.  Call light within reach, .  Skin, clean-dry- intact without evidence of bruising, but crack were observed on the heels during this admission, and bunion was also observed on great toes. Will cont to eval and treat per MD orders.  Kendra NeedlesIreti O Lauranne Beyersdorf, RN 08/31/2016 8:29 PM

## 2016-08-31 NOTE — Consult Note (Signed)
NEURO HOSPITALIST CONSULT NOTE   Requestig physician: Dr. Melynda Ripple  Reason for Consult: New left occipital lobe stroke seen on CT   History obtained from:  Family and Chart     HPI:                                                                                                                                          Kendra Nixon is an 75 y.o. female who presented to the ED with family complaint of AMS beginning yesterday. She had been incontinent of urine with difficulty ambulating, right facial droop and slurred speech. Also with perseverative nonsensical speech and wandering behavior in the house. She has a history of dementia, but at baseline she speaks normally and lives independently.   Family description of her cognitive worsening is consistent with a subacute course: On Sunday she was "slightly disoriented and as the evening wore on the patient was unable to complete sentences appropriately, she was having difficulty finding the right words to say, she was incontinent of either bowel or bladder 3. She was also noted to place her shoes on first and then attempt to place the socks over the shoes. She did not have a definitive gait disturbance but was walking more slowly than her baseline usual. Her confusion persisted to the point she was becoming somewhat agitated and restless prompting her family to bring her to the ER."  PMHx also includes prior CVA, moderate malnutrition, HTN, HLD and CAD.   Past Medical History:  Diagnosis Date  . ACE-inhibitor cough   . History of CVA (cerebrovascular accident)    Old left basal ganglion lacunar infarct noted per CT head (01/2013)  . Hyperlipidemia LDL goal < 100   . Hypertension   . Solitary lung nodule    4 mm right upper lobe nodule    Past Surgical History:  Procedure Laterality Date  . BREAST LUMPECTOMY Bilateral age 30  . FEMUR IM NAIL Right 08/16/2014   Procedure: INTRAMEDULLARY (IM) NAIL FEMORAL;  Surgeon:  Sheral Apley, MD;  Location: [facility] OR;  Service: Orthopedics;  Laterality: Right;    Family History  Problem Relation Age of Onset  . Breast cancer Sister 54  . Hypertension    . Alzheimer's disease Maternal Grandmother   . Alzheimer's disease Maternal Aunt   . Arthritis Mother   . Heart disease Mother   . Heart disease Father     Social History:  reports that she quit smoking about 3 years ago. Her smoking use included Cigarettes. She has a 7.50 pack-year smoking history. She has never used smokeless tobacco. She reports that she does not drink alcohol or use drugs.  No Known Allergies  MEDICATIONS:  No current facility-administered medications on file prior to encounter.    Current Outpatient Prescriptions on File Prior to Encounter  Medication Sig Dispense Refill  . aspirin 81 MG tablet Take 81 mg by mouth daily.    . feeding supplement, ENSURE COMPLETE, (ENSURE COMPLETE) LIQD Take 237 mLs by mouth 2 (two) times daily between meals. 5688 mL 1  . losartan (COZAAR) 100 MG tablet Take 1 tablet (100 mg total) by mouth daily. 90 tablet 1  . lovastatin (MEVACOR) 40 MG tablet TAKE ONE TABLET BY MOUTH AT BEDTIME 90 tablet 3  . meloxicam (MOBIC) 7.5 MG tablet TAKE ONE TABLET BY MOUTH TWICE DAILY (Patient taking differently: TAKE ONE TABLET BY MOUTH ONCE DAILY) 180 tablet 0  . losartan-hydrochlorothiazide (HYZAAR) 100-12.5 MG tablet Take 1 tablet by mouth daily. (Patient not taking: Reported on 08/31/2016) 90 tablet 3      ROS:                                                                                                                                       Family denied vomiting, fever and trauma. Unable to obtain ROS from patient due to altered mentation.   Blood pressure 161/89, pulse (!) 53, temperature 97.6 F (36.4 C), temperature source Axillary, resp. rate 14,  SpO2 100 %.  General Examination:                                                                                                      HEENT-  Normocephalic/atraumatic. Lungs- Respirations unlabored. No gross wheezing.  Extremities- Warm and well-perfused.   Neurological Examination Mental Status: Non-cooperative. No spontaneous speech output. When asked by family to respond, she mumbles softly and incoherently. Does not follow commands but attends to family.  Cranial Nerves: Non-cooperative. Limited assessment reveals no asymmetry.  Motor: Moves all extremities equally to noxious stimuli. Able to rise to seated position with own power. Normal tone throughout; no atrophy noted Sensory: Reacts to tactile stimuli, all 4 extremities Deep Tendon Reflexes: Unable to elicit in context of patient non-cooperation Cerebellar: Unable to assess.  Gait: Unable to assess.    Lab Results: Basic Metabolic Panel:  Recent Labs Lab 08/31/16 1217  NA 138  K 4.0  CL 105  CO2 22  GLUCOSE 78  BUN 15  CREATININE 0.91  CALCIUM 9.7    Liver Function Tests:  Recent Labs Lab 08/31/16 1217  AST 29  ALT 14  ALKPHOS 60  BILITOT 1.1  PROT 6.9  ALBUMIN 3.8   No results for input(s): LIPASE, AMYLASE in the last 168 hours. No results for input(s): AMMONIA in the last 168 hours.  CBC:  Recent Labs Lab 08/31/16 1217  WBC 4.3  HGB 12.8  HCT 38.8  MCV 83.3  PLT 163    Cardiac Enzymes: No results for input(s): CKTOTAL, CKMB, CKMBINDEX, TROPONINI in the last 168 hours.  Lipid Panel: No results for input(s): CHOL, TRIG, HDL, CHOLHDL, VLDL, LDLCALC in the last 168 hours.  CBG: No results for input(s): GLUCAP in the last 168 hours.  Microbiology: Results for orders placed or performed during the hospital encounter of 08/15/14  MRSA PCR Screening     Status: None   Collection Time: 08/16/14  1:26 AM  Result Value Ref Range Status   MRSA by PCR NEGATIVE NEGATIVE Final    Comment:         The GeneXpert MRSA Assay (FDA approved for NASAL specimens only), is one component of a comprehensive MRSA colonization surveillance program. It is not intended to diagnose MRSA infection nor to guide or monitor treatment for MRSA infections.     Coagulation Studies:  Recent Labs  08/31/16 1217  LABPROT 12.9  INR 0.98    Imaging: Dg Chest 2 View  Result Date: 08/31/2016 CLINICAL DATA:  Altered mental status today EXAM: CHEST  2 VIEW COMPARISON:  Chest x-ray of 06/03/2014 FINDINGS: No active infiltrate or effusion is seen. The frontal view is slightly rotated. There is no change in the appearance of the somewhat high positioned aortic arch, and mild cardiomegaly is stable. No bony abnormality is seen IMPRESSION: No active cardiopulmonary disease. Electronically Signed   By: Dwyane Dee M.D.   On: 08/31/2016 13:18   Ct Head Wo Contrast  Result Date: 08/31/2016 CLINICAL DATA:  Altered mental status ; right facial debris EXAM: CT HEAD WITHOUT CONTRAST TECHNIQUE: Contiguous axial images were obtained from the base of the skull through the vertex without intravenous contrast. COMPARISON:  Jan 29, 2013 FINDINGS: Brain: Mild motion artifact makes this study less than optimal. There is mild diffuse atrophy. There is no intracranial mass, hemorrhage, extra-axial fluid collection, or midline shift. There is an age uncertain infarct in the medial left occipital lobe which was not present previously and may well represent a recent infarct in this area. Elsewhere there is extensive small vessel disease throughout the centra semiovale bilaterally. There is evidence of a prior infarct involving a portion of the posterior aspect of the head of the caudate nucleus on the left in the adjacent anterior limb and genu of the left internal capsule. Decreased attenuation is noted in each thalamus with evidence of a focal prior infarct involving the anterior left thalamus. Vascular: There is no hyperdense  vessel evident. There is calcification in each carotid siphon region. Skull: Bony calvarium appears intact. Sinuses/Orbits: There is a retention cyst in the left maxillary antrum. There is mucosal thickening in several ethmoid air cells bilaterally. Visualized orbits appear symmetric bilaterally. Other: Mastoid air cells appear clear. IMPRESSION: Suspect recent infarct in the posterior left occipital lobe with localized cytotoxic edema. 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 There is small vessel disease in each thalamus, chronic and stable. There is no demonstrable mass, hemorrhage, or extra-axial fluid collection. There are foci of vascular calcification. There are scattered foci  of paranasal sinus disease. Note that motion artifact makes this study somewhat less than optimal. Electronically Signed   By: Bretta BangWilliam  Woodruff III M.D.   On: 08/31/2016 16:16   Assessment: AMS and probable new stroke on CT. Out of tPA and endovascular time windows.  1. CT with infarct, suspected to be recent, in the posterior left occipital lobe with localized cytotoxic edema.  2. Also noted on CT is extensive small vessel disease throughout the centra semiovale, prior lacunar type infarcts in the left head of the caudate nucleus and adjacent internal capsule as well as in the left anterior thalamus.   Recommendations: 1. Toxic/metabolic/infectious work up for AMS.  2. EEG.  3. MRI brain with and without contrast, TTE, MRA of head and neck.  4. PT/OT/Speech.  5. Increase home ASA dose to 325 mg po qd.  6. Continue lovastatin. Obtain CK level.  7. BP management. Out of permissive HTN time window.  8. Magnesium level.   Electronically signed: Dr. Caryl PinaEric Neill Jurewicz 08/31/2016, 4:35 PM

## 2016-08-31 NOTE — ED Triage Notes (Signed)
Pt presents for evaluation of altered mental status starting yesterday around 1100. Pt. Has some hx of dementia but daughter states she lives independently and typically is conversational and AxO x3. Pt. Family concerned about possible R facial droop that is new as well as urinary incontinence. Daughter reports hx of UTI. Pt is nonverbal on assessment. Pt. Has equal grip strength.

## 2016-08-31 NOTE — Telephone Encounter (Signed)
Noted and agree with the plan

## 2016-08-31 NOTE — Progress Notes (Signed)
Pt arrived at the unit at 1550 via bed.

## 2016-08-31 NOTE — H&P (Signed)
History and Physical    Kendra Nixon WNU:[id number] DOB: Nov 12, 1940 DOA: 08/31/2016   PCP: Mauricio Po, FNP   Patient coming from/Resides with: Private residence/lives alone  Admission status: Observation/telemetry -it may be medically necessary to stay a minimum 2 midnights to rule out impending and/or unexpected changes in physiologic status that may differ from initial evaluation performed in the ER and/or at time of admission therefore please consider reevaluating admission status within the next week 4 hours.   Chief Complaint: Acute confusion and agitation  HPI: Kendra Nixon is a 75 y.o. female with medical history significant for dementia, hypertension, bradycardia, prior CVA, dyslipidemia and malnutrition moderate degree. Patient is a functional independent elderly female who lives alone. According to her family she was at her baseline level of functioning on Friday and Saturday. One of her adult children came to visit her for the holidays and was residing with her and noticed on Sunday that she seemed to be slightly disoriented and asked the evening wore on the patient was unable to complete sentences appropriately, she was having difficulty finding the right words to say, she was incontinent of either bowel or bladder 3. She was also noted to place her shoes on first and then attempt to place the socks over the shoes. She did not have a definitive gait disturbance but was walking more slowly than her baseline usual. Her confusion persisted to the point she was becoming somewhat agitated and restless prompting her family to bring her to the ER  ED Course:  Vital Signs: BP 187/90 (BP Location: Right Arm)   Pulse 109   Temp 97.6 F (36.4 C) (Axillary)   Resp 18   SpO2 96%  PCXR: No acute processes CT head without contrast: Pending at time of admission Lab data: Sodium 138, potassium 4.0, CO2 22, BUN 15, creatinine 0.91, LFTs normal, initial lactic acid slightly elevated at 2.02  with repeat 1.62, white count 4300 differential not obtained, hemoglobin 12.8, platelets 163,000, coags normal, urinalysis within normal limits, and culture obtained in the ER. Medications and treatments: Ativan 0.5 mg IV 1, Haldol 2 mg IV 1  Review of Systems:  In addition to the HPI above,  No Fever-chills, myalgias or other constitutional symptoms No Headache, changes with Vision or hearing, new weakness, tingling, numbness in any extremity, dizziness, gait disturbance or imbalance, tremors or seizure activity No problems swallowing food or Liquids, indigestion/reflux, choking or coughing while eating, abdominal pain with or after eating No Chest pain, Cough or Shortness of Breath, palpitations, orthopnea or DOE No Abdominal pain, N/V, melena,hematochezia, dark tarry stools, constipation No dysuria, malodorous urine, hematuria or flank pain No new skin rashes, lesions, masses or bruises, No new joint pains, aches, swelling or redness No recent unintentional weight gain or loss No polyuria, polydypsia or polyphagia   Past Medical History:  Diagnosis Date  . ACE-inhibitor cough   . History of CVA (cerebrovascular accident)    Old left basal ganglion lacunar infarct noted per CT head (01/2013)  . Hyperlipidemia LDL goal < 100   . Hypertension   . Solitary lung nodule    4 mm right upper lobe nodule    Past Surgical History:  Procedure Laterality Date  . BREAST LUMPECTOMY Bilateral age 49  . FEMUR IM NAIL Right 08/16/2014   Procedure: INTRAMEDULLARY (IM) NAIL FEMORAL;  Surgeon: Renette Butters, MD;  Location: [facility];  Service: Orthopedics;  Laterality: Right;    Social History   Social History  . Marital  status: Single    Spouse name: N/A  . Number of children: 3  . Years of education: 11th grade   Occupational History  . Retired     previously worked at a [employer] for 37 years   Social History Main Topics  . Smoking status: Former Smoker    Packs/day: 0.25      Years: 30.00    Types: Cigarettes    Quit date: 10/27/2012  . Smokeless tobacco: Never Used  . Alcohol use No  . Drug use: No  . Sexual activity: Not on file   Other Topics Concern  . Not on file   Social History Narrative   Lives in [place] alone in an independent living center. Is able to perform all ADLS and IADLs independently.    Mobility: Cane Work history: Retired   No Known Allergies  Family History  Problem Relation Age of Onset  . Breast cancer Sister 23  . Hypertension    . Alzheimer's disease Maternal Grandmother   . Alzheimer's disease Maternal Aunt   . Arthritis Mother   . Heart disease Mother   . Heart disease Father      Prior to Admission medications   Medication Sig Start Date End Date Taking? Authorizing Provider  aspirin 81 MG tablet Take 81 mg by mouth daily.   Yes Historical Provider, MD  feeding supplement, ENSURE COMPLETE, (ENSURE COMPLETE) LIQD Take 237 mLs by mouth 2 (two) times daily between meals. 08/12/16  Yes Golden Circle, FNP  losartan (COZAAR) 100 MG tablet Take 1 tablet (100 mg total) by mouth daily. 12/18/15  Yes Golden Circle, FNP  lovastatin (MEVACOR) 40 MG tablet TAKE ONE TABLET BY MOUTH AT BEDTIME 02/13/16  Yes Golden Circle, FNP  meloxicam (MOBIC) 7.5 MG tablet TAKE ONE TABLET BY MOUTH TWICE DAILY Patient taking differently: TAKE ONE TABLET BY MOUTH ONCE DAILY 04/13/16  Yes Golden Circle, FNP  Multiple Vitamin (MULTIVITAMIN) tablet Take 1 tablet by mouth daily.   Yes Historical Provider, MD  losartan-hydrochlorothiazide (HYZAAR) 100-12.5 MG tablet Take 1 tablet by mouth daily. Patient not taking: Reported on 08/31/2016 08/12/16   Golden Circle, FNP    Physical Exam: Vitals:   08/31/16 1158 08/31/16 1353  BP: 192/76 187/90  Pulse: (!) 52 109  Resp: 16 18  Temp: 97.6 F (36.4 C)   TempSrc: Axillary   SpO2: 99% 96%      Constitutional: NAD, calm after sedation given, appears comfortable Eyes: PERRL, lids  and conjunctivae normal ENMT: Mucous membranes are dry. Posterior pharynx clear of any exudate or lesions. edentulous Neck: normal, supple, no masses, no thyromegaly Respiratory: clear to auscultation bilaterally, no wheezing, no crackles. Normal respiratory effort. No accessory muscle use.  Cardiovascular: Regular rate and rhythm, no murmurs / rubs / gallops. No extremity edema. 2+ pedal pulses. No carotid bruits.  Abdomen: no tenderness, no masses palpated. No hepatosplenomegaly. Bowel sounds positive.  Musculoskeletal: no clubbing / cyanosis. No joint deformity upper and lower extremities. Good ROM, no contractures. Normal muscle tone.  Skin: no rashes, lesions, ulcers. No induration Neurologic: CN 2-12 grossly intact. Sensation intact, DTR normal. Unable to accurately test strength given patient inability to follow simple commands, repeatedly pulled against patient's upper and lower extremities until she began to resistant at that time noted strength appeared to be equal. Psychiatric: Alert and oriented x name only. Less anxious mood and documented previously after given sedative meds as above. When I began conversation with patient  she repetitively with state "I think it's about 5:00"   Labs on Admission: I have personally reviewed following labs and imaging studies  CBC:  Recent Labs Lab 08/31/16 1217  WBC 4.3  HGB 12.8  HCT 38.8  MCV 83.3  PLT 416   Basic Metabolic Panel:  Recent Labs Lab 08/31/16 1217  NA 138  K 4.0  CL 105  CO2 22  GLUCOSE 78  BUN 15  CREATININE 0.91  CALCIUM 9.7   GFR: CrCl cannot be calculated (Unknown ideal weight.). Liver Function Tests:  Recent Labs Lab 08/31/16 1217  AST 29  ALT 14  ALKPHOS 60  BILITOT 1.1  PROT 6.9  ALBUMIN 3.8   No results for input(s): LIPASE, AMYLASE in the last 168 hours. No results for input(s): AMMONIA in the last 168 hours. Coagulation Profile:  Recent Labs Lab 08/31/16 1217  INR 0.98   Cardiac  Enzymes: No results for input(s): CKTOTAL, CKMB, CKMBINDEX, TROPONINI in the last 168 hours. BNP (last 3 results) No results for input(s): PROBNP in the last 8760 hours. HbA1C: No results for input(s): HGBA1C in the last 72 hours. CBG: No results for input(s): GLUCAP in the last 168 hours. Lipid Profile: No results for input(s): CHOL, HDL, LDLCALC, TRIG, CHOLHDL, LDLDIRECT in the last 72 hours. Thyroid Function Tests: No results for input(s): TSH, T4TOTAL, FREET4, T3FREE, THYROIDAB in the last 72 hours. Anemia Panel: No results for input(s): VITAMINB12, FOLATE, FERRITIN, TIBC, IRON, RETICCTPCT in the last 72 hours. Urine analysis:    Component Value Date/Time   COLORURINE YELLOW 08/31/2016 [place] 08/31/2016 1415   LABSPEC 1.020 08/31/2016 1415   PHURINE 5.0 08/31/2016 1415   GLUCOSEU NEGATIVE 08/31/2016 1415   HGBUR NEGATIVE 08/31/2016 1415   BILIRUBINUR NEGATIVE 08/31/2016 1415   KETONESUR NEGATIVE 08/31/2016 1415   PROTEINUR NEGATIVE 08/31/2016 1415   UROBILINOGEN 1.0 01/30/2013 0322   NITRITE NEGATIVE 08/31/2016 1415   LEUKOCYTESUR NEGATIVE 08/31/2016 1415   Sepsis Labs: '@LABRCNTIP' (procalcitonin:4,lacticidven:4) )No results found for this or any previous visit (from the past 240 hour(s)).   Radiological Exams on Admission: Dg Chest 2 View  Result Date: 08/31/2016 CLINICAL DATA:  Altered mental status today EXAM: CHEST  2 VIEW COMPARISON:  Chest x-ray of 06/03/2014 FINDINGS: No active infiltrate or effusion is seen. The frontal view is slightly rotated. There is no change in the appearance of the somewhat high positioned aortic arch, and mild cardiomegaly is stable. No bony abnormality is seen IMPRESSION: No active cardiopulmonary disease. Electronically Signed   By: Ivar Drape M.D.   On: 08/31/2016 13:18     Assessment/Plan Principal Problem:   Acute delirium 2/2 acute CVA/History of CVA (cerebrovascular accident) -Patient presents with somewhat  rapid change in mentation over a 2 day period from a baseline of mostly alert and oriented and functioning independently and living alone -Some symptoms appear consistent with CVA although no focal neurological motor or sensory deficits appreciated -Some symptoms appear consistent with acute delirium but no definitive cause as to why (no recent infections, no new medications, no obvious signs of acute infection on ER workup) -CT head pending -Patient moderately but not significantly hypertensive at admission so consider PRES -NPO until mentation improved; IV fluids while NPO (x 12 hrs)  ** 1629: CT scan head without contrast: Radiologist documents suspected recent infarct in the posterior left occipital lobe with localized cytotoxic edema. Will adjust admission orders and consult neurology -Initiate ischemic stroke order set:  Stat MRI/MRA brain  Echocardiogram/carotid duplex  Risk factor stratification with hemoglobin A1c and lipid panel  Frequent neuro checks  PT/OT/SLP evaluation  Consult neurology  Well outside window for thrombolytic therapies  Permissive hypertension   Active Problems:   Dementia -Quite functional was able to live independently prior to this admission -Was not on psychotropic medications prior to admission    Sinus bradycardia -Heart rate stable in the 54s    Essential hypertension -Not well controlled at presentation but has improved with administration of sedative medications -CT positive for CVA - allow for permissive hypertension: prn IV Hydralazine for SBP > 210 or DBP >110 -Hold home antihypertensive medications -no oral medications at this juncture    Hyperlipidemia -On Mevacor prior to admission    Malnutrition of moderate degree  -Once diet resumed please consider nutritional consultation      DVT prophylaxis: Subcutaneous heparin  Code Status: Full  Family Communication: Daughters at bedside Disposition Plan: Pending recovery of mentation  for patient can return to previous home environment/independent level of functioning-if deficits persist patient will need PT/OT evaluation and possible lace met at skilled nursing facility once medically stable for discharge Consults called: None     Kendra Nixon L. ANP-BC [facility] Pager [phone]   If 7PM-7AM, please contact night-coverage [email or web address] Password [facility]  08/31/2016, 3:54 PM

## 2016-08-31 NOTE — ED Provider Notes (Signed)
[facility]-EMERGENCY DEPT Provider Note    By signing my name below, I, Earmon PhoenixJennifer Waddell, attest that this documentation has been prepared under the direction and in the presence of Raeford RazorStephen Karanvir Balderston, MD. Electronically Signed: Earmon PhoenixJennifer Waddell, ED Scribe. 08/31/16. 2:37 PM.   History   Chief Complaint Chief Complaint  Patient presents with  . Altered Mental Status    The history is provided by the patient and medical records. No language interpreter was used.    LEVEL 5 CAVEAT- Full history could not be obtained due to AMS.  HPI Comments:  Kendra Nixon is a 75 y.o. female with PMHx of UTI, dementia, HTN, CAD, HLD who presents to the Emergency Department complaining of AMS that began approximately 24 hours ago. Family states she has been incontinent with urine, slurring speech and having difficulty ambulating. They state she is repeating things that do not make sense. Family reports she normally lives independently at baseline. She has not had any treatment for her symptoms. There are no modifying factors reported. They deny fever, vomiting. Family denies any trauma, injury or fall.   Past Medical History:  Diagnosis Date  . ACE-inhibitor cough   . History of CVA (cerebrovascular accident)    Old left basal ganglion lacunar infarct noted per CT head (01/2013)  . Hyperlipidemia LDL goal < 100   . Hypertension   . Solitary lung nodule    4 mm right upper lobe nodule    Patient Active Problem List   Diagnosis Date Noted  . Osteoarthritis of right lower extremity 12/18/2015  . Memory impairment 10/31/2015  . Routine general medical examination at a health care facility 01/29/2015  . Medicare annual wellness visit, subsequent 01/29/2015  . Malnutrition of moderate degree (HCC) 08/17/2014  . Fracture, intertrochanteric, right femur (HCC) 08/15/2014  . Hyperlipidemia 08/15/2014  . Essential hypertension 08/15/2014  . Tobacco use 08/15/2014  . Femur fracture (HCC)   . PAC (premature  atrial contraction) 01/30/2013  . Syncope 01/29/2013  . Sinus bradycardia 01/29/2013  . First degree heart block 01/29/2013  . History of CVA (cerebrovascular accident)   . Hyperlipidemia with target LDL less than 100   . Pulmonary nodule 11/04/2012  . Cough 11/04/2012  . Hypertension 11/04/2012  . Solitary pulmonary nodule 11/03/2012    Past Surgical History:  Procedure Laterality Date  . BREAST LUMPECTOMY Bilateral age 75  . FEMUR IM NAIL Right 08/16/2014   Procedure: INTRAMEDULLARY (IM) NAIL FEMORAL;  Surgeon: Sheral Apleyimothy D Murphy, MD;  Location: [facility];  Service: Orthopedics;  Laterality: Right;    OB History    No data available       Home Medications    Prior to Admission medications   Medication Sig Start Date End Date Taking? Authorizing Provider  aspirin 81 MG tablet Take 81 mg by mouth daily.   Yes Historical Provider, MD  feeding supplement, ENSURE COMPLETE, (ENSURE COMPLETE) LIQD Take 237 mLs by mouth 2 (two) times daily between meals. 08/12/16  Yes Veryl SpeakGregory D Calone, FNP  losartan (COZAAR) 100 MG tablet Take 1 tablet (100 mg total) by mouth daily. 12/18/15  Yes Veryl SpeakGregory D Calone, FNP  lovastatin (MEVACOR) 40 MG tablet TAKE ONE TABLET BY MOUTH AT BEDTIME 02/13/16  Yes Veryl SpeakGregory D Calone, FNP  meloxicam (MOBIC) 7.5 MG tablet TAKE ONE TABLET BY MOUTH TWICE DAILY Patient taking differently: TAKE ONE TABLET BY MOUTH ONCE DAILY 04/13/16  Yes Veryl SpeakGregory D Calone, FNP  Multiple Vitamin (MULTIVITAMIN) tablet Take 1 tablet by mouth daily.  Yes Historical Provider, MD  losartan-hydrochlorothiazide (HYZAAR) 100-12.5 MG tablet Take 1 tablet by mouth daily. Patient not taking: Reported on 08/31/2016 08/12/16   Veryl Speak, FNP    Family History Family History  Problem Relation Age of Onset  . Breast cancer Sister 34  . Hypertension    . Alzheimer's disease Maternal Grandmother   . Alzheimer's disease Maternal Aunt   . Arthritis Mother   . Heart disease Mother   . Heart disease  Father     Social History Social History  Substance Use Topics  . Smoking status: Former Smoker    Packs/day: 0.25    Years: 30.00    Types: Cigarettes    Quit date: 10/27/2012  . Smokeless tobacco: Never Used  . Alcohol use No     Allergies   Patient has no known allergies.   Review of Systems Review of Systems LEVEL 5 CAVEAT- Full history could not be obtained due to AMS.  Physical Exam Updated Vital Signs BP 192/76 (BP Location: Right Arm)   Pulse (!) 52   Temp 97.6 F (36.4 C) (Axillary)   Resp 16   SpO2 99%   Physical Exam  Constitutional: She is oriented to person, place, and time. She appears well-developed and well-nourished. No distress.  HENT:  Head: Normocephalic and atraumatic.  Eyes: EOM are normal.  Neck: Normal range of motion.  Cardiovascular: Normal rate, regular rhythm and normal heart sounds.   Pulmonary/Chest: Effort normal and breath sounds normal.  Abdominal: Soft. She exhibits no distension. There is no tenderness.  Musculoskeletal: Normal range of motion.  Neurological: She is alert and oriented to person, place, and time.  Skin: Skin is warm and dry.  Psychiatric:  Babbling incoherently. Not consistently following commands. No focal deficits.  Nursing note and vitals reviewed.    ED Treatments / Results  DIAGNOSTIC STUDIES: Oxygen Saturation is 99% on RA, normal by my interpretation.   COORDINATION OF CARE: 1:20 PM- Will order labs and urinalysis. Pt verbalizes understanding and agrees to plan.  Medications - No data to display  Labs (all labs ordered are listed, but only abnormal results are displayed) Labs Reviewed  COMPREHENSIVE METABOLIC PANEL - Abnormal; Notable for the following:       Result Value   GFR calc non Af Amer 59 (*)    All other components within normal limits  I-STAT CG4 LACTIC ACID, ED - Abnormal; Notable for the following:    Lactic Acid, Venous 2.02 (*)    All other components within normal limits    URINE CULTURE  CBC  PROTIME-INR  URINALYSIS, ROUTINE W REFLEX MICROSCOPIC  I-STAT CG4 LACTIC ACID, ED    EKG  EKG Interpretation None       Radiology Dg Chest 2 View  Result Date: 08/31/2016 CLINICAL DATA:  Altered mental status today EXAM: CHEST  2 VIEW COMPARISON:  Chest x-ray of 06/03/2014 FINDINGS: No active infiltrate or effusion is seen. The frontal view is slightly rotated. There is no change in the appearance of the somewhat high positioned aortic arch, and mild cardiomegaly is stable. No bony abnormality is seen IMPRESSION: No active cardiopulmonary disease. Electronically Signed   By: Dwyane Dee M.D.   On: 08/31/2016 13:18    Procedures Procedures (including critical care time)  Medications Ordered in ED Medications - No data to display   Initial Impression / Assessment and Plan / ED Course  I have reviewed the triage vital signs and the nursing notes.  Pertinent labs & imaging results that were available during my care of the patient were reviewed by me and considered in my medical decision making (see chart for details).  Clinical Course     75yF with acute change in mental status. Apparently can carry on a conversation and take care of most ADLs (bathing, dressing self, cooking) at baseline. Now mumbling incoherently and not consistently following commands. Afebrile. UA looking ok. No recent med changes. No known ingestion or trauma. Malignant hypertension? Will try some labetalol. CT head. Will need admitted.   I personally preformed the services scribed in my presence. The recorded information has been reviewed is accurate. Raeford RazorStephen Ayden Hardwick, MD.   Final Clinical Impressions(s) / ED Diagnoses   Final diagnoses:  Delirium    New Prescriptions New Prescriptions   No medications on file     Raeford RazorStephen Eleftherios Dudenhoeffer, MD 08/31/16 [phone]

## 2016-09-01 ENCOUNTER — Observation Stay (HOSPITAL_COMMUNITY): Payer: Medicare Other

## 2016-09-01 ENCOUNTER — Encounter (HOSPITAL_COMMUNITY): Payer: Self-pay | Admitting: *Deleted

## 2016-09-01 ENCOUNTER — Observation Stay (HOSPITAL_BASED_OUTPATIENT_CLINIC_OR_DEPARTMENT_OTHER): Payer: Medicare Other

## 2016-09-01 DIAGNOSIS — I6789 Other cerebrovascular disease: Secondary | ICD-10-CM | POA: Diagnosis not present

## 2016-09-01 DIAGNOSIS — Z8673 Personal history of transient ischemic attack (TIA), and cerebral infarction without residual deficits: Secondary | ICD-10-CM

## 2016-09-01 LAB — ECHOCARDIOGRAM COMPLETE
CHL CUP MV DEC (S): 349
CHL CUP REG VEL DIAS: 83.4 cm/s
CHL CUP RV SYS PRESS: 31 mmHg
CHL CUP TV REG PEAK VELOCITY: 264 cm/s
E decel time: 349 msec
E/e' ratio: 23.62
FS: 49 % — AB (ref 28–44)
IVS/LV PW RATIO, ED: 0.78
LA ID, A-P, ES: 13 mm
LA diam end sys: 13 mm
LA vol A4C: 38.6 ml
LA vol index: 32.1 mL/m2
LADIAMINDEX: 0.85 cm/m2
LAVOL: 49.1 mL
LV TDI E'LATERAL: 3.04
LVEEAVG: 23.62
LVEEMED: 23.62
LVELAT: 3.04 cm/s
LVOT VTI: 22.2 cm
LVOT area: 3.46 cm2
LVOT peak vel: 92.4 cm/s
LVOTD: 21 mm
LVOTSV: 77 mL
MV pk A vel: 76.6 m/s
MVPG: 2 mmHg
MVPKEVEL: 71.8 m/s
PW: 12.4 mm — AB (ref 0.6–1.1)
RV LATERAL S' VELOCITY: 10.2 cm/s
TAPSE: 27.7 mm
TDI e' medial: 2.81
TR max vel: 264 cm/s

## 2016-09-01 LAB — BASIC METABOLIC PANEL
ANION GAP: 9 (ref 5–15)
BUN: 12 mg/dL (ref 6–20)
CALCIUM: 8.8 mg/dL — AB (ref 8.9–10.3)
CO2: 23 mmol/L (ref 22–32)
CREATININE: 0.75 mg/dL (ref 0.44–1.00)
Chloride: 107 mmol/L (ref 101–111)
GLUCOSE: 72 mg/dL (ref 65–99)
Potassium: 4 mmol/L (ref 3.5–5.1)
Sodium: 139 mmol/L (ref 135–145)

## 2016-09-01 LAB — LIPID PANEL
CHOLESTEROL: 173 mg/dL (ref 0–200)
HDL: 64 mg/dL (ref >40)
LDL Cholesterol: 103 mg/dL — ABNORMAL HIGH (ref 0–99)
TRIGLYCERIDES: 32 mg/dL (ref <150)
Total CHOL/HDL Ratio: 2.7 RATIO
VLDL: 6 mg/dL (ref 0–40)

## 2016-09-01 LAB — URINE CULTURE: Culture: NO GROWTH

## 2016-09-01 LAB — VAS US CAROTID
LCCAPDIAS: 17 cm/s
LCCAPSYS: 76 cm/s
LEFT VERTEBRAL DIAS: -14 cm/s
LICAPSYS: -36 cm/s
Left CCA dist dias: -12 cm/s
Left CCA dist sys: -48 cm/s
Left ICA dist dias: -30 cm/s
Left ICA dist sys: -80 cm/s
Left ICA prox dias: -10 cm/s
RCCAPDIAS: 15 cm/s
RIGHT ECA DIAS: -8 cm/s
RIGHT VERTEBRAL DIAS: 13 cm/s
Right CCA prox sys: 54 cm/s

## 2016-09-01 LAB — CBC
HCT: 30.8 % — ABNORMAL LOW (ref 36.0–46.0)
HEMOGLOBIN: 10.6 g/dL — AB (ref 12.0–15.0)
MCH: 28 pg (ref 26.0–34.0)
MCHC: 34.4 g/dL (ref 30.0–36.0)
MCV: 81.3 fL (ref 78.0–100.0)
PLATELETS: 129 10*3/uL — AB (ref 150–400)
RBC: 3.79 MIL/uL — AB (ref 3.87–5.11)
RDW: 14.3 % (ref 11.5–15.5)
WBC: 4.4 10*3/uL (ref 4.0–10.5)

## 2016-09-01 LAB — TSH: TSH: 1.087 u[IU]/mL (ref 0.350–4.500)

## 2016-09-01 LAB — VITAMIN B12: VITAMIN B 12: 359 pg/mL (ref 180–914)

## 2016-09-01 MED ORDER — LORAZEPAM 2 MG/ML IJ SOLN
2.0000 mg | Freq: Once | INTRAMUSCULAR | Status: AC
  Administered 2016-09-01: 2 mg via INTRAVENOUS
  Filled 2016-09-01: qty 1

## 2016-09-01 MED ORDER — HALOPERIDOL LACTATE 5 MG/ML IJ SOLN
5.0000 mg | Freq: Once | INTRAMUSCULAR | Status: AC
  Administered 2016-09-01: 5 mg via INTRAVENOUS
  Filled 2016-09-01: qty 1

## 2016-09-01 NOTE — Progress Notes (Addendum)
OT Cancellation Note  Patient Details Name: Kendra Nixon MRN: [id number] DOB: [birth year]   Cancelled Treatment:    Reason Eval/Treat Not Completed: Patient at procedure or test/ unavailable. Pt at MRI on OT arrival and unavailable for evaluation. Noted PT recommendations for SNF placement for continued rehabilitation post-acute D/C. OT will check back as able for evaluation. Thank you.  [id number] Logan CircleCharity A Iceis Nixon, OTR/L [phone] 09/01/2016, 2:53 PM

## 2016-09-01 NOTE — Progress Notes (Signed)
  Echocardiogram 2D Echocardiogram has been performed.  Marisue Humblelexis N Burley Kopka 09/01/2016, 4:28 PM

## 2016-09-01 NOTE — Evaluation (Signed)
Physical Therapy Evaluation Patient Details Name: Kendra Nixon MRN: [id number] DOB: [birth year] Today's Date: 09/01/2016   History of Present Illness  75 yo female with onset of weakness and noted L occipital lobe stroke of recent origin, chronic brain changes but mostly caudate nucleus and internal capsule of L anterior thalamus.  Has acute encephalopathy, PMHx:  dementia, CVA, lung nodule  Clinical Impression  Pt is demonstrating some significant changes from PLOF per son's description, especially with regard to independence of gait.  He is planning to be involved with her care but pt still lives alone, making inpt SNF care a priority.  Will follow acutely as well for increasing her strength, to assess the feasibility of using SPC moving forward and to transition her care to home faster from SNF.      Follow Up Recommendations SNF    Equipment Recommendations  None recommended by PT    Recommendations for Other Services       Precautions / Restrictions Precautions Precautions: Fall (telemetry) Restrictions Weight Bearing Restrictions: No      Mobility  Bed Mobility Overal bed mobility: Needs Assistance Bed Mobility: Supine to Sit;Sit to Supine     Supine to sit: Mod assist;Min assist Sit to supine: Mod assist   General bed mobility comments: assisted to lift legs after dense cues for trunk to return to bed  Transfers Overall transfer level: Needs assistance Equipment used: Rolling walker (2 wheeled);Straight cane (pt would not use the walker) Transfers: Sit to/from [employer] CorporationStand;Stand Pivot Transfers Sit to Stand: Min assist Stand pivot transfers: Min assist       General transfer comment: pt requires help but continually for safety with all transitions  Ambulation/Gait Ambulation/Gait assistance: Min assist;Mod assist;+2 physical assistance;+2 safety/equipment (cues for hands and use of AD) Ambulation Distance (Feet): 35 Feet Assistive device: 1 person hand held  assist;Straight cane Gait Pattern/deviations: Step-to pattern;Step-through pattern;Trunk flexed;Narrow base of support;Wide base of support;Shuffle;Decreased stride length Gait velocity: reduced Gait velocity interpretation: Below normal speed for age/gender General Gait Details: pt used SPC but lets it lag behind often on R hand, PT holding her L hand and son with PT to assist and encourage pt  Stairs            Wheelchair Mobility    Modified Rankin (Stroke Patients Only) Modified Rankin (Stroke Patients Only) Pre-Morbid Rankin Score: Slight disability Modified Rankin: Moderately severe disability     Balance Overall balance assessment: Needs assistance Sitting-balance support: Feet supported Sitting balance-Leahy Scale: Fair     Standing balance support: Bilateral upper extremity supported Standing balance-Leahy Scale: Poor                               Pertinent Vitals/Pain Pain Assessment: Faces Pain Score: 0-No pain Faces Pain Scale: No hurt    Home Living Family/patient expects to be discharged to:: Skilled nursing facility ("will need rehab") Living Arrangements: Alone Available Help at Discharge: Family;Available PRN/intermittently Type of Home: Apartment                Prior Function Level of Independence: Independent with assistive device(s) (SPC)               Hand Dominance        Extremity/Trunk Assessment   Upper Extremity Assessment Upper Extremity Assessment: Generalized weakness    Lower Extremity Assessment Lower Extremity Assessment: Generalized weakness    Cervical / Trunk Assessment Cervical / Trunk Assessment:  Kyphotic  Communication   Communication: Expressive difficulties  Cognition Arousal/Alertness: Lethargic Behavior During Therapy: Flat affect Overall Cognitive Status: Impaired/Different from baseline Area of Impairment: Orientation;Attention;Memory;Following  commands;Safety/judgement;Awareness;Problem solving Orientation Level: Place;Time;Situation Current Attention Level: Selective Memory: Decreased short-term memory Following Commands: Follows one step commands inconsistently Safety/Judgement: Decreased awareness of safety;Decreased awareness of deficits Awareness: Intellectual Problem Solving: Slow processing;Decreased initiation;Difficulty sequencing;Requires verbal cues;Requires tactile cues      General Comments      Exercises     Assessment/Plan    PT Assessment Patient needs continued PT services  PT Problem List Decreased strength;Decreased range of motion;Decreased activity tolerance;Decreased balance;Decreased mobility;Decreased coordination;Decreased cognition;Decreased knowledge of use of DME;Decreased safety awareness;Cardiopulmonary status limiting activity          PT Treatment Interventions DME instruction;Gait training;Functional mobility training;Therapeutic activities;Therapeutic exercise;Balance training;Neuromuscular re-education;Patient/family education    PT Goals (Current goals can be found in the Care Plan section)  Acute Rehab PT Goals Patient Stated Goal: none stated PT Goal Formulation: With family Time For Goal Achievement: 09/15/16 Potential to Achieve Goals: Fair    Frequency Min 3X/week   Barriers to discharge Decreased caregiver support home alone    Co-evaluation               End of Session Equipment Utilized During Treatment: Gait belt Activity Tolerance: Patient limited by fatigue;Patient limited by lethargy;Treatment limited secondary to medical complications (Comment) (pulses are 59-62 with mobility) Patient left: in bed;with call bell/phone within reach;with bed alarm set;with family/visitor present Nurse Communication: Mobility status         Time: 1031-1110 PT Time Calculation (min) (ACUTE ONLY): 39 min   Charges:   PT Evaluation $PT Eval Moderate Complexity: 1  Procedure PT Treatments $Gait Training: 8-22 mins $Therapeutic Exercise: 8-22 mins   PT G Codes:        Ivar DrapeStout, Tekeya Geffert E 09/01/2016, 1:47 PM   Samul Dadauth Alissa Pharr, PT MS Acute Rehab Dept. Number: [facility] [phone] and [facility] [phone]

## 2016-09-01 NOTE — Progress Notes (Signed)
Pt was given iv ativan to have MRI done, according to the technician she said pt is not able to lye flat on her back for the test to be done, she said she tried it three time but pt still turn on her side, I call her again to find out if she can send transport again to try if pt will be cooperative since she is still sleeping under the effect of ativan, but she said she has already tried three times and pt not cooperative, on call physician has been made aware, will continue to monitor pt

## 2016-09-01 NOTE — Progress Notes (Signed)
*  PRELIMINARY RESULTS* Vascular Ultrasound Carotid Duplex (Doppler) has been completed.  Preliminary findings: Bilateral 1-39% ICA stenosis, antegrade vertebral flow. Difficult exam due to patient confusion and poor positioning.  Kendra FischerCharlotte C Sanders Manninen 09/01/2016, 4:10 PM

## 2016-09-01 NOTE — Progress Notes (Signed)
Pt still sleeping under the effect of ativan,  not able to follow command and also confused, neuro checks not done every two hours as scheduled, will continue to monitor pt

## 2016-09-01 NOTE — Progress Notes (Signed)
Patient arrived to unit via NT floor staff.  Patient son and daughter in law at bedside, they were oriented to room/unit.  Questions answered at this time. Continue to monitor patient.

## 2016-09-01 NOTE — Evaluation (Signed)
Speech Language Pathology Evaluation Patient Details Name: Kendra Nixon Amason MRN: [id number] DOB: 1941-04-04 Today's Date: 09/01/2016 Time: [id number] SLP Time Calculation (min) (ACUTE ONLY): 25 min  Problem List:  Patient Active Problem List   Diagnosis Date Noted  . Dementia 08/31/2016  . CVA (cerebral vascular accident) (HCC) 08/31/2016  . Osteoarthritis of right lower extremity 12/18/2015  . Memory impairment 10/31/2015  . Routine general medical examination at a health care facility 01/29/2015  . Medicare annual wellness visit, subsequent 01/29/2015  . Malnutrition of moderate degree (HCC) 08/17/2014  . Fracture, intertrochanteric, right femur (HCC) 08/15/2014  . Hyperlipidemia 08/15/2014  . Essential hypertension 08/15/2014  . Tobacco use 08/15/2014  . Femur fracture (HCC)   . PAC (premature atrial contraction) 01/30/2013  . Syncope 01/29/2013  . Sinus bradycardia 01/29/2013  . First degree heart block 01/29/2013  . History of CVA (cerebrovascular accident)   . Pulmonary nodule 11/04/2012  . Cough 11/04/2012  . Solitary pulmonary nodule 11/03/2012   Past Medical History:  Past Medical History:  Diagnosis Date  . ACE-inhibitor cough   . Arthritis    "all over" (09/01/2016)  . CVA (cerebral vascular accident) (HCC)    Old left basal ganglion lacunar infarct noted per CT head (01/2013)  . CVA (cerebral vascular accident) (HCC) 08/31/2016   "verbal before; nonverbal now; weak all over now" (09/01/2016)  . Dementia    Hattie Perch/notes 08/31/2016  . Hyperlipidemia LDL goal < 100   . Hypertension   . Solitary lung nodule    4 mm right upper lobe nodule  . UTI (urinary tract infection) 08/31/2016   Hattie Perch/notes 08/31/2016   Past Surgical History:  Past Surgical History:  Procedure Laterality Date  . BREAST LUMPECTOMY Bilateral 1960   "benign"  . FEMUR IM NAIL Right 08/16/2014   Procedure: INTRAMEDULLARY (IM) NAIL FEMORAL;  Surgeon: Sheral Apleyimothy D Murphy, MD;  Location: [facility] OR;  Service:  Orthopedics;  Laterality: Right;  . FRACTURE SURGERY     HPI:  [name] an 75 y.o.femalewho presented to the ED with family complaint of AMS beginning yesterday. She had been incontinent of urine with difficulty ambulating, right facial droop and slurred speech. Also with perseverative nonsensical speech and wandering behavior in the house. She has a history of dementia, but at baseline she speaks normally and lives independently. PMHx also includes prior CVA, moderate malnutrition, HTN, HLD and CAD. t-PA not administered. Head CT 08/31/16 findings: suspected recent infarct in the posterior left occipital lobe with localized cytotoxic edema. 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 There is small vessel disease in each thalamus, chronic and stable. Order for speech-language evaluation.   Assessment / Plan / Recommendation Clinical Impression  Patient presents with functional decline in cognitive communication and language as evidenced by deficits in orientation, verbal expression, auditory comprehension, and ability to follow commands. Patient's son reports prior to hospitalization, patient lived alone and was managing medications and cooking. Patient provides inappropriate responses to basic biographical questions with Y/N framing. Follows 1/5 basic verbal commands.  Speech is incoherent, low in volume; neologisms noted. MD present reports suspected bilateral visual deficits. RN reports MRI attempted but unsuccessful due to patient agitation; additional medications will be administered prior to reattempting. SLP will follow acutely for cognitive-linguistic treatment.     SLP Assessment  Patient needs continued Speech [facility]  Follow Up Recommendations  Other (comment) (TBD)    Frequency and  Duration min 2x/week  2 weeks      SLP Evaluation Cognition  Overall Cognitive Status: Impaired/Different from baseline Arousal/Alertness: Awake/alert Orientation Level: Disoriented X4 Behaviors: Restless [employer] Scales of Cognitive Functioning: Confused/inappropriate/non-agitated       Comprehension  Auditory Comprehension Overall Auditory Comprehension: Impaired Yes/No Questions: Impaired Basic Biographical Questions: 0-25% accurate Basic Immediate Environment Questions: 0-24% accurate Commands: Impaired One Step Basic Commands: 0-24% accurate Visual Recognition/Discrimination Discrimination: Exceptions to [facility] L/R Discrimination: Unable to indentify Reading Comprehension Reading Status: Not tested    Expression Expression Primary Mode of Expression: Other (comment) (Vocalizes, speech is incoherant) Verbal Expression Overall Verbal Expression: Impaired Initiation: Impaired Repetition: Impaired Level of Impairment: Word level Naming: Impairment Responsive: 0-25% accurate Confrontation: Not tested Convergent: Not tested Divergent: Not tested Verbal Errors: Neologisms Pragmatics: Unable to assess Written Expression Written Expression: Not tested   Oral / Motor  Oral Motor/Sensory Function Overall Oral Motor/Sensory Function: Other (comment) (Patient is unable to follow commands) Motor Speech Overall Motor Speech: Impaired Phonation: Low vocal intensity Articulation: Impaired Level of Impairment: Word Intelligibility: Intelligibility reduced Word: 0-24% accurate   GO          Functional Assessment Tool Used: skilled clinical observation Functional Limitations: Spoken language expressive Spoken Language Comprehension Current Status ([id number]): At least 80 percent but less than 100 percent impaired, limited or restricted Spoken Language Comprehension Goal Status [phone](G9160): At least 60 percent but less than 80 percent impaired, limited or restricted Spoken  Language Comprehension Discharge Status [phone]([id number]): At least 60 percent but less than 80 percent impaired, limited or restricted Spoken Language Expression Current Status [phone]([id number]): At least 80 percent but less than 100 percent impaired, limited or restricted Spoken Language Expression Goal Status [phone]([id number]): At least 60 percent but less than 80 percent impaired, limited or restricted Spoken Language Expression Discharge Status [phone](G9164): At least 60 percent but less than 80 percent impaired, limited or restricted      Rondel BatonMary Beth Akaya Proffit, MS CF-SLP Speech-Language Pathologist      Arlana LindauMary E Ivyrose Hashman 09/01/2016, 2:44 PM

## 2016-09-01 NOTE — Progress Notes (Signed)
PROGRESS NOTE                                                                                                                                                                                                             Patient Demographics:    Kendra Nixon, is a 75 y.o. female, DOB - Jul 09, 1941, ZOX:[id number]  Admit date - 08/31/2016   Admitting Physician Haydee Salter, MD  Outpatient Primary MD for the patient is Jeanine Luz, FNP  LOS - 0  Outpatient Specialists: None  Chief Complaint  Patient presents with  . Altered Mental Status       Brief Narrative   75 year old female with prior history of CVA,  dementia, hypertension, bradycardia, moderate malnutrition and dyslipidemia who reportedly is independent of her ADLs and lives alone. One of her children came to visit her to the holidays and was living with her when she noticed that she was disoriented and was having word finding difficulty. She was also walking more slowly than her baseline. Family brought her to the ED where she was found to have elevated blood pressure of 187/90 mmHg. Head CT showed possible recent infarct in the left occipital lobe with localized cytotoxic edema. Extensive small vessel disease was noted with prior lacunar type infarct. Stroke pathway initiated and patient admitted to hospitalist service. Neurology consulted.   Subjective Patient is nonverbal and restless. Unable to communicate.   Principal Problem: Acute ischemic stroke MRI brain shows acute 3 mm left thalamus lacunar infarct. Also there is moderate to severe chronic small vessel ischemic disease. Shows or left occipital lobe/PCA territory infarct. MRA head negative for large vessel disease. Showed possible stenosis of left superior cerebellar origin and mildly stenotic right M2 origin. Stroke team following. Placed on full dose aspirin. Keep nothing by mouth until mental  status more improved and cleared for swallowing. -Carotid Doppler without significant stenosis. 2-D echo shows EF of 60-65% with grade 1 diastolic dysfunction and elevated ventricular end diastolic filling pressure. -LDL of 103 (needs statin once able to swallow pills). A1c pending.  Active Problems: Acute metabolic encephalopathy Has mild underlying dementia but reportedly quite functional and living independently prior to admission. Patient agitated and restless. Not on any psychotropic medication prior to admission. Currently on when necessary Haldol. No signs of infection. Check TSH and B12.  Sinus bradycardia Stable on the monitor with heart rate in the 50s.  Hypertensive urgency Allowing permissive blood pressure given stroke. Holding home blood pressure medications.  Moderate protein calorie malnutrition Nutrition consult once told to take by mouth       Code Status:full code Family Communication son and daughter at bedside  Disposition Plan  : pending improvement  Barriers For Discharge :  Active symptoms  Consults  :  stroke  Procedures  :  CT head  MRI brain Echo Carotid doppler  DVT Prophylaxis  :  Lovenox  Lab Results  Component Value Date   PLT 129 (L) 09/01/2016    Antibiotics  :    Anti-infectives    None        Objective:   Vitals:   09/01/16 0006 09/01/16 0222 09/01/16 0632 09/01/16 1043  BP: (!) 173/77 (!) 150/72 (!) 149/67 (!) 167/137  Pulse: 76 (!) 50 (!) 52 74  Resp: 18 18 18 20   Temp: 98.5 F (36.9 C) 98.2 F (36.8 C) 98.4 F (36.9 C) 98.7 F (37.1 C)  TempSrc: Oral  Oral   SpO2: 96% 99% 95% 90%    Wt Readings from Last 3 Encounters:  08/12/16 51.3 kg (113 lb)  03/23/16 49.5 kg (109 lb 1.9 oz)  12/18/15 49 kg (108 lb)     Intake/Output Summary (Last 24 hours) at 09/01/16 1702 Last data filed at 09/01/16 0600  Gross per 24 hour  Intake                0 ml  Output                0 ml  Net                0 ml      Physical Exam  Gen: restless HEENT:  moist mucosa, supple neck Chest: clear b/l, no added sounds CVS: N S1&S2, no murmurs, rubs or gallop GI: soft, NT, ND Musculoskeletal: warm, no edema CNS: poorly communicative, non verbal    Data Review:    CBC  Recent Labs Lab 08/31/16 1217 09/01/16 0712  WBC 4.3 4.4  HGB 12.8 10.6*  HCT 38.8 30.8*  PLT 163 129*  MCV 83.3 81.3  MCH 27.5 28.0  MCHC 33.0 34.4  RDW 14.8 14.3    Chemistries   Recent Labs Lab 08/31/16 1217 08/31/16 2029 09/01/16 0712  NA 138  --  139  K 4.0  --  4.0  CL 105  --  107  CO2 22  --  23  GLUCOSE 78  --  72  BUN 15  --  12  CREATININE 0.91  --  0.75  CALCIUM 9.7  --  8.8*  MG  --  1.7  --   AST 29  --   --   ALT 14  --   --   ALKPHOS 60  --   --   BILITOT 1.1  --   --    ------------------------------------------------------------------------------------------------------------------  Recent Labs  09/01/16 0712  CHOL 173  HDL 64  LDLCALC 103*  TRIG 32  CHOLHDL 2.7    No results found for: HGBA1C ------------------------------------------------------------------------------------------------------------------ No results for input(s): TSH, T4TOTAL, T3FREE, THYROIDAB in the last 72 hours.  Invalid input(s): FREET3 ------------------------------------------------------------------------------------------------------------------ No results for input(s): VITAMINB12, FOLATE, FERRITIN, TIBC, IRON, RETICCTPCT in the last 72 hours.  Coagulation profile  Recent Labs Lab 08/31/16 1217  INR 0.98    No results for input(s):  DDIMER in the last 72 hours.  Cardiac Enzymes No results for input(s): CKMB, TROPONINI, MYOGLOBIN in the last 168 hours.  Invalid input(s): CK ------------------------------------------------------------------------------------------------------------------ No results found for: BNP  Inpatient Medications  Scheduled Meds: .  stroke: mapping our early  stages of recovery book   Does not apply Once  . aspirin  300 mg Rectal Daily   Or  . aspirin  325 mg Oral Daily  . enoxaparin (LOVENOX) injection  40 mg Subcutaneous Q24H  . sodium chloride flush  3 mL Intravenous Q12H   Continuous Infusions: PRN Meds:.acetaminophen **OR** acetaminophen, hydrALAZINE, ondansetron **OR** ondansetron (ZOFRAN) IV  Micro Results Recent Results (from the past 240 hour(s))  Urine culture     Status: None   Collection Time: 08/31/16  2:15 PM  Result Value Ref Range Status   Specimen Description URINE, CLEAN CATCH  Final   Special Requests NONE  Final   Culture NO GROWTH  Final   Report Status 09/01/2016 FINAL  Final    Radiology Reports Dg Chest 2 View  Result Date: 08/31/2016 CLINICAL DATA:  Altered mental status today EXAM: CHEST  2 VIEW COMPARISON:  Chest x-ray of 06/03/2014 FINDINGS: No active infiltrate or effusion is seen. The frontal view is slightly rotated. There is no change in the appearance of the somewhat high positioned aortic arch, and mild cardiomegaly is stable. No bony abnormality is seen IMPRESSION: No active cardiopulmonary disease. Electronically Signed   By: Dwyane Dee M.D.   On: 08/31/2016 13:18   Ct Head Wo Contrast  Result Date: 08/31/2016 CLINICAL DATA:  Altered mental status ; right facial debris EXAM: CT HEAD WITHOUT CONTRAST TECHNIQUE: Contiguous axial images were obtained from the base of the skull through the vertex without intravenous contrast. COMPARISON:  Jan 29, 2013 FINDINGS: Brain: Mild motion artifact makes this study less than optimal. There is mild diffuse atrophy. There is no intracranial mass, hemorrhage, extra-axial fluid collection, or midline shift. There is an age uncertain infarct in the medial left occipital lobe which was not present previously and may well represent a recent infarct in this area. Elsewhere there is extensive small vessel disease throughout the centra semiovale bilaterally. There is evidence of  a prior infarct involving a portion of the posterior aspect of the head of the caudate nucleus on the left in the adjacent anterior limb and genu of the left internal capsule. Decreased attenuation is noted in each thalamus with evidence of a focal prior infarct involving the anterior left thalamus. Vascular: There is no hyperdense vessel evident. There is calcification in each carotid siphon region. Skull: Bony calvarium appears intact. Sinuses/Orbits: There is a retention cyst in the left maxillary antrum. There is mucosal thickening in several ethmoid air cells bilaterally. Visualized orbits appear symmetric bilaterally. Other: Mastoid air cells appear clear. IMPRESSION: Suspect recent infarct in the posterior left occipital lobe with localized cytotoxic edema. 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 There is small vessel disease in each thalamus, chronic and stable. There is no demonstrable mass, hemorrhage, or extra-axial fluid collection. There are foci of vascular calcification. There are scattered foci of paranasal sinus disease. Note that motion artifact makes this study somewhat less than optimal. Electronically Signed   By: Bretta Bang III M.D.   On: 08/31/2016 16:16   Mr Brain Wo Contrast  Result Date: 09/01/2016 CLINICAL DATA:  Confusion and agitation. Word-finding difficulty and incontinence. Assess stroke. History of hypertension, hyperlipidemia, dementia. EXAM: MRI HEAD WITHOUT CONTRAST MRA HEAD WITHOUT CONTRAST TECHNIQUE: Multiplanar, multiecho pulse sequences of the brain and surrounding structures were obtained without intravenous contrast. Angiographic images of the head were obtained using MRA technique without contrast. COMPARISON:  CT HEAD August 31, 2016 FINDINGS: MRI HEAD FINDINGS BRAIN: 3 mm focus  of reduced diffusion LEFT posterior thalamus with low ADC values. Numerous chronic microhemorrhage in the deep gray nuclei associated with chronic hypertension. Old LEFT occipital lobe infarct with susceptibility artifact most compatible with mineralization. Multiple old bilateral basal ganglia and thalamus lacunar infarcts. Mild ex vacuo dilatation LEFT lateral ventricle, no hydrocephalus. Patchy to confluent supratentorial and pontine white matter FLAIR T2 hyperintensities without midline shift, mass effect or masses. VASCULAR: Dolichoectatic major intracranial vascular flow voids present at skull base. SKULL AND UPPER CERVICAL SPINE: No abnormal sellar expansion. No suspicious calvarial bone marrow signal. Craniocervical junction maintained. Moderate C3-4 broad-based disc osteophyte complex resulting in moderate canal stenosis with ventral cord deformity. Trace LEFT atlanto axial joint effusion. SINUSES/ORBITS: Trace paranasal sinus mucosal thickening. Mildly atretic LEFT maxillary sinus most compatible with chronic sinusitis. Mastoid air cells are well aerated. Status post bilateral ocular lens implants. The included ocular globes and orbital contents are non-suspicious. OTHER: Patient is edentulous. MRA HEAD FINDINGS ANTERIOR CIRCULATION: Normal flow related enhancement of the included cervical, petrous, cavernous and supraclinoid internal carotid arteries. Patent anterior communicating artery with like coli fenestration. Normal flow related enhancement of the anterior and middle cerebral arteries, including distal segments. Mild probable stenosis RIGHT M2 origin. Asymmetrically decreased number of distal RIGHT middle cerebral artery segments, attributed to slight patient tilt in the scanner. No large vessel occlusion, high-grade stenosis, abnormal luminal irregularity, aneurysm. POSTERIOR CIRCULATION: LEFT vertebral artery is dominant. Dolicoectatic basilar artery. Basilar artery is patent, with flow related  enhancement of the main branch vessels. Likely stenotic LEFT superior cerebellar origin. Normal flow related enhancement of the posterior cerebral arteries. No large vessel occlusion, high-grade stenosis, abnormal luminal irregularity, aneurysm. IMPRESSION: MRI HEAD: Acute 3 mm LEFT thalamus lacunar infarct. Findings of chronic hypertension including moderate to severe chronic small vessel ischemic disease and old deep gray nuclei lacunar infarcts. 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 MRA HEAD: No emergent large vessel occlusion. Suspected stenosis LEFT superior cerebellar origin and mildly stenotic RIGHT M2 origin. Electronically Signed   By: Awilda Metroourtnay  Bloomer M.D.   On: 09/01/2016 15:52   Mr Maxine GlennMra Head/brain ZOWo Cm  Result Date: 09/01/2016 CLINICAL DATA:  Confusion and agitation. Word-finding difficulty and incontinence. Assess stroke. History of hypertension, hyperlipidemia, dementia. EXAM: MRI HEAD WITHOUT CONTRAST MRA HEAD WITHOUT CONTRAST TECHNIQUE: Multiplanar, multiecho pulse sequences of the brain and surrounding structures were obtained without intravenous contrast. Angiographic images of the head were obtained using MRA technique without contrast. COMPARISON:  CT HEAD August 31, 2016 FINDINGS: MRI HEAD FINDINGS BRAIN: 3 mm focus of reduced diffusion LEFT posterior thalamus with low ADC values. Numerous chronic microhemorrhage in the deep gray nuclei associated with chronic hypertension. Old LEFT occipital lobe infarct with susceptibility artifact most compatible with mineralization. Multiple old bilateral basal ganglia and thalamus lacunar infarcts. Mild ex vacuo dilatation LEFT lateral ventricle, no hydrocephalus. Patchy to confluent supratentorial and pontine white matter FLAIR T2 hyperintensities without midline shift, mass effect or masses. VASCULAR: Dolichoectatic major intracranial vascular flow voids present at skull base. SKULL AND UPPER CERVICAL SPINE: No abnormal sellar expansion.  No  suspicious calvarial bone marrow signal. Craniocervical junction maintained. Moderate C3-4 broad-based disc osteophyte complex resulting in moderate canal stenosis with ventral cord deformity. Trace LEFT atlanto axial joint effusion. SINUSES/ORBITS: Trace paranasal sinus mucosal thickening. Mildly atretic LEFT maxillary sinus most compatible with chronic sinusitis. Mastoid air cells are well aerated. Status post bilateral ocular lens implants. The included ocular globes and orbital contents are non-suspicious. OTHER: Patient is edentulous. MRA HEAD FINDINGS ANTERIOR CIRCULATION: Normal flow related enhancement of the included cervical, petrous, cavernous and supraclinoid internal carotid arteries. Patent anterior communicating artery with like coli fenestration. Normal flow related enhancement of the anterior and middle cerebral arteries, including distal segments. Mild probable stenosis RIGHT M2 origin. Asymmetrically decreased number of distal RIGHT middle cerebral artery segments, attributed to slight patient tilt in the scanner. No large vessel occlusion, high-grade stenosis, abnormal luminal irregularity, aneurysm. POSTERIOR CIRCULATION: LEFT vertebral artery is dominant. Dolicoectatic basilar artery. Basilar artery is patent, with flow related enhancement of the main branch vessels. Likely stenotic LEFT superior cerebellar origin. Normal flow related enhancement of the posterior cerebral arteries. No large vessel occlusion, high-grade stenosis, abnormal luminal irregularity, aneurysm. IMPRESSION: MRI HEAD: Acute 3 mm LEFT thalamus lacunar infarct. Findings of chronic hypertension including moderate to severe chronic small vessel ischemic disease and old deep gray nuclei lacunar infarcts. 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 MRA HEAD: No emergent large vessel occlusion. Suspected stenosis LEFT superior cerebellar origin and mildly stenotic RIGHT M2 origin. Electronically Signed   By: Awilda Metro M.D.   On: 09/01/2016 15:52    Time Spent in minutes  35   Eddie North M.D on 09/01/2016 at 5:02 PM  Between 7am to 7pm - Pager - [phone]  After 7pm go to [email or web address] - password [facility]  [facility] -  Office  [phone]

## 2016-09-01 NOTE — Progress Notes (Signed)
Report given to nurse on [facility]5C. Patient to be transferred after shift change.

## 2016-09-01 NOTE — Progress Notes (Signed)
STROKE TEAM PROGRESS NOTE   HISTORY OF PRESENT ILLNESS (per record) Kendra Nixon is an 75 y.o. female who presented to the ED with family complaint of AMS beginning yesterday. She had been incontinent of urine with difficulty ambulating, right facial droop and slurred speech. Also with perseverative nonsensical speech and wandering behavior in the house. She has a history of dementia, but at baseline she speaks normally and lives independently.   Family description of her cognitive worsening is consistent with a subacute course: On Sunday she was "slightly disoriented and as the evening wore on the patient was unable to complete sentences appropriately, she was having difficulty finding the right words to say, she was incontinent of either bowel or bladder 3.She was also noted to place her shoes on first and then attempt to place the socks over the shoes. She did not have a definitive gait disturbance but was walking more slowly than her baseline usual. Her confusion persisted to the point she was becoming somewhat agitated and restless prompting her family to bring her to the ER."  PMHx also includes prior CVA, moderate malnutrition, HTN, HLD and CAD.  Patient was not administered IV t-PA. She was admitted for further evaluation and treatment.   SUBJECTIVE (INTERVAL HISTORY) Patient's son and daughter-in-law are at the bedside and states that they have noticed neurological decline overnight. When the daughter-in-law arrived she noticed worsening of her  facial droop and patient appears to be more confused. She had been able to walk with assistance earlier on.    OBJECTIVE Temp:  [97.6 F (36.4 C)-98.7 F (37.1 C)] 98.7 F (37.1 C) (12/27 1043) Pulse Rate:  [49-109] 74 (12/27 1043) Cardiac Rhythm: Bundle branch block;Heart block (12/27 0700) Resp:  [14-20] 20 (12/27 1043) BP: (149-192)/(67-137) 167/137 (12/27 1043) SpO2:  [90 %-100 %] 90 % (12/27 1043)  CBC:  Recent Labs Lab  08/31/16 1217 09/01/16 0712  WBC 4.3 4.4  HGB 12.8 10.6*  HCT 38.8 30.8*  MCV 83.3 81.3  PLT 163 129*    Basic Metabolic Panel:  Recent Labs Lab 08/31/16 1217 08/31/16 2029 09/01/16 0712  NA 138  --  139  K 4.0  --  4.0  CL 105  --  107  CO2 22  --  23  GLUCOSE 78  --  72  BUN 15  --  12  CREATININE 0.91  --  0.75  CALCIUM 9.7  --  8.8*  MG  --  1.7  --   PHOS  --  3.0  --     Lipid Panel:    Component Value Date/Time   CHOL 173 09/01/2016 0712   TRIG 32 09/01/2016 0712   HDL 64 09/01/2016 0712   CHOLHDL 2.7 09/01/2016 0712   VLDL 6 09/01/2016 0712   LDLCALC 103 (H) 09/01/2016 0712   HgbA1c: No results found for: HGBA1C Urine Drug Screen: No results found for: LABOPIA, COCAINSCRNUR, LABBENZ, AMPHETMU, THCU, LABBARB    IMAGING  Dg Chest 2 View  Result Date: 08/31/2016 CLINICAL DATA:  Altered mental status today EXAM: CHEST  2 VIEW COMPARISON:  Chest x-ray of 06/03/2014 FINDINGS: No active infiltrate or effusion is seen. The frontal view is slightly rotated. There is no change in the appearance of the somewhat high positioned aortic arch, and mild cardiomegaly is stable. No bony abnormality is seen IMPRESSION: No active cardiopulmonary disease. Electronically Signed   By: Dwyane Dee M.D.   On: 08/31/2016 13:18   Ct Head Wo Contrast  Result Date: 08/31/2016 CLINICAL DATA:  Altered mental status ; right facial debris EXAM: CT HEAD WITHOUT CONTRAST TECHNIQUE: Contiguous axial images were obtained from the base of the skull through the vertex without intravenous contrast. COMPARISON:  Jan 29, 2013 FINDINGS: Brain: Mild motion artifact makes this study less than optimal. There is mild diffuse atrophy. There is no intracranial mass, hemorrhage, extra-axial fluid collection, or midline shift. There is an age uncertain infarct in the medial left occipital lobe which was not present previously and may well represent a recent infarct in this area. Elsewhere there is extensive  small vessel disease throughout the centra semiovale bilaterally. There is evidence of a prior infarct involving a portion of the posterior aspect of the head of the caudate nucleus on the left in the adjacent anterior limb and genu of the left internal capsule. Decreased attenuation is noted in each thalamus with evidence of a focal prior infarct involving the anterior left thalamus. Vascular: There is no hyperdense vessel evident. There is calcification in each carotid siphon region. Skull: Bony calvarium appears intact. Sinuses/Orbits: There is a retention cyst in the left maxillary antrum. There is mucosal thickening in several ethmoid air cells bilaterally. Visualized orbits appear symmetric bilaterally. Other: Mastoid air cells appear clear. IMPRESSION: Suspect recent infarct in the posterior left occipital lobe with localized cytotoxic edema. 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 There is small vessel disease in each thalamus, chronic and stable. There is no demonstrable mass, hemorrhage, or extra-axial fluid collection. There are foci of vascular calcification. There are scattered foci of paranasal sinus disease. Note that motion artifact makes this study somewhat less than optimal. Electronically Signed   By: Bretta BangWilliam  Woodruff III M.D.   On: 08/31/2016 16:16       PHYSICAL EXAM Frail elderly African-American lady not in distress.  . Afebrile. Head is nontraumatic. Neck is supple without bruit.    Cardiac exam no murmur or gallop. Lungs are clear to auscultation. Distal pulses are well felt. Neurological Exam :  Awake and alert but disoriented to time and place. She will not follow any commands. Speaks only few words and occasional short sentences. She does not blink to threat on either side. She will not follow gaze  in all directions. Fundi were not visualized. 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 Face is symmetric. Tongue is midline. She is able to move all 4 extremities against gravity in a symmetric fashion. No focal weakness noted. Deep tendon reflexes are symmetric. Plantars downgoing. She withdraws to painful stimuli equally in all 4 extremities. Gait was not tested. ASSESSMENT/PLAN Ms. Windell Mouldingunice Cozort is a 75 y.o. female with history of dementia, hypertension, CVA presenting with altered mental status, incontinence, difficulty ambulating, R facial droop and slurred speech. She did not receive IV t-PA.   Stroke:  Possible L occipital infarct. workup underway. Neurological exam appears to be disproportionate to an isolated left occipital infarct and suspect bilateral posterior circulation infarcts  Presenting CT L occipital with cytotoxic edema  MRI  pending   MRA  pending   Carotid Doppler  pending   2D Echo  pending   EEG pending   LDL 102  HgbA1c pending  Lovenox 40 mg sq daily for VTE prophylaxis  Diet NPO time specified  aspirin 81 mg daily prior to admission, now on  aspirin 300 mg suppository daily  Ongoing aggressive stroke risk factor management  Therapy recommendations:  pending   Disposition:  pending   Hypertension  Stable Permissive hypertension (OK if < 220/120) but gradually normalize in 5-7 days Long-term BP goal normotensive  Hyperlipidemia  Home meds:  mevacor 49   Resume in hospital once able to swallow  LDL 102, goal < 70  Continue statin at discharge  Other Stroke Risk Factors  Advanced age  Former Cigarette smoker  Hx stroke/TIA  Old L basal ganglia infarct on CT in 2014  Other Active Problems  Baseline dementia, but still able to live alone PTA  sinus bradycardia  Malnutrition of a moderate degree  Hospital day # 0  I have personally examined this patient, reviewed notes, independently viewed imaging studies, participated in  medical decision making and plan of care.ROS completed by me personally and pertinent positives fully documented  I have made any additions or clarifications directly to the above note. The patient's neurological exam appears to be disproportionate to isolated left occipital infarct and she may have had bilateral posterior circulation infarct. Recommend checking MRI scan under sedation. Long discussion of the bedside with the patient's son and daughter and answered questions. If she is unable to cooperate for an MRI may get CT angiogram of the brain instead . Discuss with Dr. Marlis Edelsonindell. Greater than 50% time during this 35 minute visit was spent on counseling and coordination of care about her stroke risk, prevention and treatment. Delia HeadyPramod Samanvitha Germany, MD Medical Director [facility] Pager: [phone] 09/01/2016 1:50 PM   To contact Stroke Continuity provider, please refer to [email or web address]. After hours, contact General Neurology

## 2016-09-01 NOTE — Progress Notes (Signed)
Carotid duplex and echocardiogram orders received. We have attempted these studies twice, and the patient continues to be uncooperative. Please notify us if/when the patient becomes cooperative and is willing to have exams completed.  09/01/2016 1:04 PM Gertie FeyMichelle Paublo Warshawsky, BS, RVT, RDCS, RDMS

## 2016-09-02 ENCOUNTER — Observation Stay (HOSPITAL_COMMUNITY): Payer: Medicare Other

## 2016-09-02 DIAGNOSIS — E785 Hyperlipidemia, unspecified: Secondary | ICD-10-CM | POA: Diagnosis present

## 2016-09-02 DIAGNOSIS — R4781 Slurred speech: Secondary | ICD-10-CM | POA: Diagnosis present

## 2016-09-02 DIAGNOSIS — Z7982 Long term (current) use of aspirin: Secondary | ICD-10-CM | POA: Diagnosis not present

## 2016-09-02 DIAGNOSIS — I2510 Atherosclerotic heart disease of native coronary artery without angina pectoris: Secondary | ICD-10-CM | POA: Diagnosis present

## 2016-09-02 DIAGNOSIS — E440 Moderate protein-calorie malnutrition: Secondary | ICD-10-CM | POA: Diagnosis not present

## 2016-09-02 DIAGNOSIS — Z8673 Personal history of transient ischemic attack (TIA), and cerebral infarction without residual deficits: Secondary | ICD-10-CM | POA: Diagnosis not present

## 2016-09-02 DIAGNOSIS — R410 Disorientation, unspecified: Secondary | ICD-10-CM | POA: Diagnosis present

## 2016-09-02 DIAGNOSIS — Z87891 Personal history of nicotine dependence: Secondary | ICD-10-CM | POA: Diagnosis not present

## 2016-09-02 DIAGNOSIS — Z803 Family history of malignant neoplasm of breast: Secondary | ICD-10-CM | POA: Diagnosis not present

## 2016-09-02 DIAGNOSIS — I10 Essential (primary) hypertension: Secondary | ICD-10-CM | POA: Diagnosis not present

## 2016-09-02 DIAGNOSIS — Z8249 Family history of ischemic heart disease and other diseases of the circulatory system: Secondary | ICD-10-CM | POA: Diagnosis not present

## 2016-09-02 DIAGNOSIS — R001 Bradycardia, unspecified: Secondary | ICD-10-CM | POA: Diagnosis present

## 2016-09-02 DIAGNOSIS — Z8261 Family history of arthritis: Secondary | ICD-10-CM | POA: Diagnosis not present

## 2016-09-02 DIAGNOSIS — F0390 Unspecified dementia without behavioral disturbance: Secondary | ICD-10-CM | POA: Diagnosis present

## 2016-09-02 DIAGNOSIS — G9340 Encephalopathy, unspecified: Secondary | ICD-10-CM | POA: Diagnosis not present

## 2016-09-02 DIAGNOSIS — G936 Cerebral edema: Secondary | ICD-10-CM | POA: Diagnosis present

## 2016-09-02 DIAGNOSIS — R32 Unspecified urinary incontinence: Secondary | ICD-10-CM | POA: Diagnosis present

## 2016-09-02 DIAGNOSIS — I639 Cerebral infarction, unspecified: Secondary | ICD-10-CM | POA: Diagnosis present

## 2016-09-02 DIAGNOSIS — Z681 Body mass index (BMI) 19 or less, adult: Secondary | ICD-10-CM | POA: Diagnosis not present

## 2016-09-02 DIAGNOSIS — R29810 Facial weakness: Secondary | ICD-10-CM | POA: Diagnosis present

## 2016-09-02 DIAGNOSIS — G9341 Metabolic encephalopathy: Secondary | ICD-10-CM | POA: Diagnosis present

## 2016-09-02 DIAGNOSIS — Z820 Family history of epilepsy and other diseases of the nervous system: Secondary | ICD-10-CM | POA: Diagnosis not present

## 2016-09-02 DIAGNOSIS — I63312 Cerebral infarction due to thrombosis of left middle cerebral artery: Secondary | ICD-10-CM | POA: Diagnosis not present

## 2016-09-02 DIAGNOSIS — I160 Hypertensive urgency: Secondary | ICD-10-CM | POA: Diagnosis present

## 2016-09-02 LAB — HEMOGLOBIN A1C
HEMOGLOBIN A1C: 5 % (ref 4.8–5.6)
MEAN PLASMA GLUCOSE: 97 mg/dL

## 2016-09-02 MED ORDER — HALOPERIDOL LACTATE 5 MG/ML IJ SOLN
2.0000 mg | Freq: Once | INTRAMUSCULAR | Status: AC
  Administered 2016-09-02: 2 mg via INTRAVENOUS
  Filled 2016-09-02: qty 1

## 2016-09-02 MED ORDER — ENSURE ENLIVE PO LIQD
237.0000 mL | Freq: Two times a day (BID) | ORAL | Status: DC
  Administered 2016-09-03 (×2): 237 mL via ORAL
  Filled 2016-09-02 (×4): qty 237

## 2016-09-02 MED ORDER — BOOST / RESOURCE BREEZE PO LIQD
1.0000 | ORAL | Status: DC
  Administered 2016-09-03: 1 via ORAL
  Filled 2016-09-02 (×2): qty 1

## 2016-09-02 MED ORDER — ATORVASTATIN CALCIUM 10 MG PO TABS
20.0000 mg | ORAL_TABLET | Freq: Every day | ORAL | Status: DC
  Administered 2016-09-02 – 2016-09-03 (×2): 20 mg via ORAL
  Filled 2016-09-02 (×2): qty 2

## 2016-09-02 NOTE — Progress Notes (Addendum)
Initial Nutrition Assessment  DOCUMENTATION CODES:   Not applicable  INTERVENTION:  Provide Ensure Enlive po BID, each supplement provides 350 kcal and 20 grams of protein Magic Cup ice cream BID with meal trays, each supplement provides 290 kcal and 9 grams of protein Provide Boost Breeze once daily with breakfast, provides 250 kcal and 9 grams of protein   NUTRITION DIAGNOSIS:   Predicted suboptimal nutrient intake related to dysphagia, chronic illness as evidenced by per patient/family report, other (see comment) (Low BMI).    GOAL:   Patient will meet greater than or equal to 90% of their needs   MONITOR:   PO intake, Supplement acceptance, Labs, Skin, I & O's, Weight trends  REASON FOR ASSESSMENT:   Low Braden    ASSESSMENT:   75 year old female with prior history of CVA,  dementia, hypertension, bradycardia, moderate malnutrition and dyslipidemia who reportedly is independent of her ADLs and lives alone. One of her children came to visit her to the holidays and was living with her when she noticed that she was disoriented and was having word finding difficulty. Head CT showed possible recent infarct in the left occipital lobe with localized cytotoxic edema. Extensive small vessel disease was noted with prior lacunar type infarct.  Pt about to be transported to EEG. Pt's son at bedside states that patient was fairly independent PTA, but had an aid that was supposed to help patient prepare meals. He suspects that patient has not been eating well. He states that plan is for patient to go home with her daughter. Pt was assessed by SLP this morning and advanced to a Dysphagia 1 diet with this liquids. Per son, pt ate well at lunch today, abut 75% of meal. He states that patient ate with her hands. Per son, pt has had Ensure in the past. Most recent weight is from 3 weeks ago.   Returned and spoke with pt's daughter who states that patient was started on Ensure Enlive a couple  weeks ago as instructed by her MD; pt has been drinking Ensure 1-2 times daily at home. Pt confused and moving around in bed at time of second visit. Severe muscle wasting noted in patients thighs, calves, and patellar region of legs.   Labs: low hemoglobin  Diet Order:  DIET - DYS 1 Room service appropriate? Yes; Fluid consistency: Thin  Skin:     Last BM:  12/27  Height:   Ht Readings from Last 1 Encounters:  08/12/16 5\' 4"  (1.626 m)    Weight:   Wt Readings from Last 1 Encounters:  08/12/16 113 lb (51.3 kg)    Ideal Body Weight:  54.5 kg  BMI:  There is no height or weight on file to calculate BMI.  Estimated Nutritional Needs:   Kcal:  1550-1750  Protein:  65-75 grams  Fluid:  1.6 L/day  EDUCATION NEEDS:   No education needs identified at this time  Dorothea Ogleeanne Christien Berthelot RD, CSP, LDN Inpatient Clinical Dietitian Pager: [phone] After Hours Pager: [phone]

## 2016-09-02 NOTE — Care Management Note (Signed)
Case Management Note  Patient Details  Name: Kendra Mouldingunice Estupinan MRN: [id number] Date of Birth: Nov 03, 1940  Subjective/Objective:    Pt admitted with CVA. She is from home alone.                 Action/Plan: PT recommending SNF. CM following for discharge disposition.   Expected Discharge Date:                  Expected Discharge Plan:  Skilled Nursing Facility  In-House Referral:     Discharge planning Services     Post Acute Care Choice:    Choice offered to:     DME Arranged:    DME Agency:     HH Arranged:    HH Agency:     Status of Service:  In process, will continue to follow  If discussed at Long Length of Stay Meetings, dates discussed:    Additional Comments:  Kermit BaloKelli F Clarissa Laird, RN 09/02/2016, 11:04 AM

## 2016-09-02 NOTE — Progress Notes (Signed)
STROKE TEAM PROGRESS NOTE   HISTORY OF PRESENT ILLNESS (per record) Kendra Nixon is an 75 y.o. female who presented to the ED with family complaint of AMS beginning yesterday. She had been incontinent of urine with difficulty ambulating, right facial droop and slurred speech. Also with perseverative nonsensical speech and wandering behavior in the house. She has a history of dementia, but at baseline she speaks normally and lives independently.   Family description of her cognitive worsening is consistent with a subacute course: On Sunday she was "slightly disoriented and as the evening wore on the patient was unable to complete sentences appropriately, she was having difficulty finding the right words to say, she was incontinent of either bowel or bladder 3.She was also noted to place her shoes on first and then attempt to place the socks over the shoes. She did not have a definitive gait disturbance but was walking more slowly than her baseline usual. Her confusion persisted to the point she was becoming somewhat agitated and restless prompting her family to bring her to the ER."  PMHx also includes prior CVA, moderate malnutrition, HTN, HLD and CAD.  Patient was not administered IV t-PA. She was admitted for further evaluation and treatment.   SUBJECTIVE (INTERVAL HISTORY) Patient's son is at the bedside and states that he has noticed slight improvement. MRI shows only tiny left thalamic infarct and multiple old infarcts.  OBJECTIVE Temp:  [97.5 F (36.4 C)-98.7 F (37.1 C)] 97.9 F (36.6 C) (12/28 0514) Pulse Rate:  [60-74] 63 (12/28 0514) Cardiac Rhythm: Normal sinus rhythm;Heart block (12/28 0724) Resp:  [16-20] 18 (12/28 0514) BP: (130-167)/(77-137) 150/77 (12/28 0514) SpO2:  [90 %-100 %] 100 % (12/28 0514)  CBC:   Recent Labs Lab 08/31/16 1217 09/01/16 0712  WBC 4.3 4.4  HGB 12.8 10.6*  HCT 38.8 30.8*  MCV 83.3 81.3  PLT 163 129*    Basic Metabolic Panel:    Recent Labs Lab 08/31/16 1217 08/31/16 2029 09/01/16 0712  NA 138  --  139  K 4.0  --  4.0  CL 105  --  107  CO2 22  --  23  GLUCOSE 78  --  72  BUN 15  --  12  CREATININE 0.91  --  0.75  CALCIUM 9.7  --  8.8*  MG  --  1.7  --   PHOS  --  3.0  --     Lipid Panel:     Component Value Date/Time   CHOL 173 09/01/2016 0712   TRIG 32 09/01/2016 0712   HDL 64 09/01/2016 0712   CHOLHDL 2.7 09/01/2016 0712   VLDL 6 09/01/2016 0712   LDLCALC 103 (H) 09/01/2016 0712   HgbA1c:  Lab Results  Component Value Date   HGBA1C 5.0 09/01/2016   Urine Drug Screen: No results found for: LABOPIA, COCAINSCRNUR, LABBENZ, AMPHETMU, THCU, LABBARB    IMAGING  Dg Chest 2 View  Result Date: 08/31/2016 CLINICAL DATA:  Altered mental status today EXAM: CHEST  2 VIEW COMPARISON:  Chest x-ray of 06/03/2014 FINDINGS: No active infiltrate or effusion is seen. The frontal view is slightly rotated. There is no change in the appearance of the somewhat high positioned aortic arch, and mild cardiomegaly is stable. No bony abnormality is seen IMPRESSION: No active cardiopulmonary disease. Electronically Signed   By: Dwyane DeePaul  Barry M.D.   On: 08/31/2016 13:18   Ct Head Wo Contrast  Result Date: 08/31/2016 CLINICAL DATA:  Altered mental status ; right  facial debris EXAM: CT HEAD WITHOUT CONTRAST TECHNIQUE: Contiguous axial images were obtained from the base of the skull through the vertex without intravenous contrast. COMPARISON:  Jan 29, 2013 FINDINGS: Brain: Mild motion artifact makes this study less than optimal. There is mild diffuse atrophy. There is no intracranial mass, hemorrhage, extra-axial fluid collection, or midline shift. There is an age uncertain infarct in the medial left occipital lobe which was not present previously and may well represent a recent infarct in this area. Elsewhere there is extensive small vessel disease throughout the centra semiovale bilaterally. There is evidence of a prior  infarct involving a portion of the posterior aspect of the head of the caudate nucleus on the left in the adjacent anterior limb and genu of the left internal capsule. Decreased attenuation is noted in each thalamus with evidence of a focal prior infarct involving the anterior left thalamus. Vascular: There is no hyperdense vessel evident. There is calcification in each carotid siphon region. Skull: Bony calvarium appears intact. Sinuses/Orbits: There is a retention cyst in the left maxillary antrum. There is mucosal thickening in several ethmoid air cells bilaterally. Visualized orbits appear symmetric bilaterally. Other: Mastoid air cells appear clear. IMPRESSION: Suspect recent infarct in the posterior left occipital lobe with localized cytotoxic edema. 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 There is small vessel disease in each thalamus, chronic and stable. There is no demonstrable mass, hemorrhage, or extra-axial fluid collection. There are foci of vascular calcification. There are scattered foci of paranasal sinus disease. Note that motion artifact makes this study somewhat less than optimal. Electronically Signed   By: Bretta Bang III M.D.   On: 08/31/2016 16:16   Mr Brain Wo Contrast  Result Date: 09/01/2016 CLINICAL DATA:  Confusion and agitation. Word-finding difficulty and incontinence. Assess stroke. History of hypertension, hyperlipidemia, dementia. EXAM: MRI HEAD WITHOUT CONTRAST MRA HEAD WITHOUT CONTRAST TECHNIQUE: Multiplanar, multiecho pulse sequences of the brain and surrounding structures were obtained without intravenous contrast. Angiographic images of the head were obtained using MRA technique without contrast. COMPARISON:  CT HEAD August 31, 2016 FINDINGS: MRI HEAD FINDINGS BRAIN: 3 mm focus of  reduced diffusion LEFT posterior thalamus with low ADC values. Numerous chronic microhemorrhage in the deep gray nuclei associated with chronic hypertension. Old LEFT occipital lobe infarct with susceptibility artifact most compatible with mineralization. Multiple old bilateral basal ganglia and thalamus lacunar infarcts. Mild ex vacuo dilatation LEFT lateral ventricle, no hydrocephalus. Patchy to confluent supratentorial and pontine white matter FLAIR T2 hyperintensities without midline shift, mass effect or masses. VASCULAR: Dolichoectatic major intracranial vascular flow voids present at skull base. SKULL AND UPPER CERVICAL SPINE: No abnormal sellar expansion. No suspicious calvarial bone marrow signal. Craniocervical junction maintained. Moderate C3-4 broad-based disc osteophyte complex resulting in moderate canal stenosis with ventral cord deformity. Trace LEFT atlanto axial joint effusion. SINUSES/ORBITS: Trace paranasal sinus mucosal thickening. Mildly atretic LEFT maxillary sinus most compatible with chronic sinusitis. Mastoid air cells are well aerated. Status post bilateral ocular lens implants. The included ocular globes and orbital contents are non-suspicious. OTHER: Patient is edentulous. MRA HEAD FINDINGS ANTERIOR CIRCULATION: Normal flow related enhancement of the included cervical, petrous, cavernous and supraclinoid internal carotid arteries. Patent anterior communicating artery with like coli fenestration. Normal flow related enhancement of the anterior and middle cerebral arteries, including distal segments. Mild  probable stenosis RIGHT M2 origin. Asymmetrically decreased number of distal RIGHT middle cerebral artery segments, attributed to slight patient tilt in the scanner. No large vessel occlusion, high-grade stenosis, abnormal luminal irregularity, aneurysm. POSTERIOR CIRCULATION: LEFT vertebral artery is dominant. Dolicoectatic basilar artery. Basilar artery is patent, with flow related  enhancement of the main branch vessels. Likely stenotic LEFT superior cerebellar origin. Normal flow related enhancement of the posterior cerebral arteries. No large vessel occlusion, high-grade stenosis, abnormal luminal irregularity, aneurysm. IMPRESSION: MRI HEAD: Acute 3 mm LEFT thalamus lacunar infarct. Findings of chronic hypertension including moderate to severe chronic small vessel ischemic disease and old deep gray nuclei lacunar infarcts. 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 MRA HEAD: No emergent large vessel occlusion. Suspected stenosis LEFT superior cerebellar origin and mildly stenotic RIGHT M2 origin. Electronically Signed   By: Awilda Metro M.D.   On: 09/01/2016 15:52   Mr [name] Head/brain ZO Cm  Result Date: 09/01/2016 CLINICAL DATA:  Confusion and agitation. Word-finding difficulty and incontinence. Assess stroke. History of hypertension, hyperlipidemia, dementia. EXAM: MRI HEAD WITHOUT CONTRAST MRA HEAD WITHOUT CONTRAST TECHNIQUE: Multiplanar, multiecho pulse sequences of the brain and surrounding structures were obtained without intravenous contrast. Angiographic images of the head were obtained using MRA technique without contrast. COMPARISON:  CT HEAD August 31, 2016 FINDINGS: MRI HEAD FINDINGS BRAIN: 3 mm focus of reduced diffusion LEFT posterior thalamus with low ADC values. Numerous chronic microhemorrhage in the deep gray nuclei associated with chronic hypertension. Old LEFT occipital lobe infarct with susceptibility artifact most compatible with mineralization. Multiple old bilateral basal ganglia and thalamus lacunar infarcts. Mild ex vacuo dilatation LEFT lateral ventricle, no hydrocephalus. Patchy to confluent supratentorial and pontine white matter FLAIR T2 hyperintensities without midline shift, mass effect or masses. VASCULAR: Dolichoectatic major intracranial vascular flow voids present at skull base. SKULL AND UPPER CERVICAL SPINE: No abnormal sellar expansion.  No suspicious calvarial bone marrow signal. Craniocervical junction maintained. Moderate C3-4 broad-based disc osteophyte complex resulting in moderate canal stenosis with ventral cord deformity. Trace LEFT atlanto axial joint effusion. SINUSES/ORBITS: Trace paranasal sinus mucosal thickening. Mildly atretic LEFT maxillary sinus most compatible with chronic sinusitis. Mastoid air cells are well aerated. Status post bilateral ocular lens implants. The included ocular globes and orbital contents are non-suspicious. OTHER: Patient is edentulous. MRA HEAD FINDINGS ANTERIOR CIRCULATION: Normal flow related enhancement of the included cervical, petrous, cavernous and supraclinoid internal carotid arteries. Patent anterior communicating artery with like coli fenestration. Normal flow related enhancement of the anterior and middle cerebral arteries, including distal segments. Mild probable stenosis RIGHT M2 origin. Asymmetrically decreased number of distal RIGHT middle cerebral artery segments, attributed to slight patient tilt in the scanner. No large vessel occlusion, high-grade stenosis, abnormal luminal irregularity, aneurysm. POSTERIOR CIRCULATION: LEFT vertebral artery is dominant. Dolicoectatic basilar artery. Basilar artery is patent, with flow related enhancement of the main branch vessels. Likely stenotic LEFT superior cerebellar origin. Normal flow related enhancement of the posterior cerebral arteries. No large vessel occlusion, high-grade stenosis, abnormal luminal irregularity, aneurysm. IMPRESSION: MRI HEAD: Acute 3 mm LEFT thalamus lacunar infarct. Findings of chronic hypertension including moderate to severe chronic small vessel ischemic disease and old deep gray nuclei lacunar infarcts. 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 MRA HEAD: No emergent large vessel occlusion. Suspected stenosis LEFT superior cerebellar origin and mildly stenotic RIGHT M2 origin. Electronically Signed   By: Awilda Metro M.D.   On: 09/01/2016 15:52       PHYSICAL EXAM Frail elderly African-American lady not  in distress.  . Afebrile. Head is nontraumatic. Neck is supple without bruit.    Cardiac exam no murmur or gallop. Lungs are clear to auscultation. Distal pulses are well felt. Neurological Exam :  Awake and alert but disoriented to time and place. She will not follow any commands. Speaks only few words and occasional short sentences. She does not blink to threat on either side. She will not follow gaze in all directions. Fundi were not visualized. 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 Face is symmetric. Tongue is midline. She is able to move all 4 extremities against gravity in a symmetric fashion. No focal weakness noted. Deep tendon reflexes are symmetric. Plantars downgoing. She withdraws to painful stimuli equally in all 4 extremities. Gait was not tested. ASSESSMENT/PLAN Ms. Kendra Mouldingunice Simonet is a 75 y.o. female with history of dementia, hypertension, CVA presenting with altered mental status, incontinence, difficulty ambulating, R facial droop and slurred speech. She did not receive IV t-PA.   Stroke:  Possible L thalamic infarct.   Neurological exam appears to be disproportionate to an isolated left occipital infarct and suspect bilateral posterior circulation infarcts  Presenting CT L occipital with cytotoxic edema  MRI  Left thalamic infarct   MRA   No emergent large vessel occlusion.  Carotid Doppler  The vertebral arteries appear patent with antegrade flow. - Findings consistent with a 1-39 percent stenosis involving the   right internal carotid artery and the left internal carotid    artery. 2D Echo Left ventricle: The cavity size was normal. There was moderate   concentric hypertrophy. Systolic function was normal. The   estimated ejection fraction was in the range of 60% to 65%. Wall   motion was normal; there were no regional wall motion    abnormalities   EEG pending    LDL 102  HgbA1c 5.0  Lovenox 40 mg sq daily for VTE prophylaxis Diet NPO time specified  aspirin 81 mg daily prior to admission, now on aspirin 300 mg suppository daily  Ongoing aggressive stroke risk factor management  Therapy recommendations:  pending   Disposition:  pending   Hypertension  Stable Permissive hypertension (OK if < 220/120) but gradually normalize in 5-7 days Long-term BP goal normotensive  Hyperlipidemia  Home meds:  mevacor 49   Resume in hospital once able to swallow  LDL 102, goal < 70  Continue statin at discharge  Other Stroke Risk Factors  Advanced age  Former Cigarette smoker  Hx stroke/TIA  Old L basal ganglia infarct on CT in 2014  Other Active Problems  Baseline dementia, but still able to live alone PTA  sinus bradycardia  Malnutrition of a moderate degree  Hospital day # 0  I have personally examined this patient, reviewed notes, independently viewed imaging studies, participated in medical decision making and plan of care.ROS completed by me personally and pertinent positives fully documented  I have made any additions or clarifications directly to the above note. The patient's neurological exam appears to be disproportionate to isolated left occipital infarct and she may have had bilateral posterior circulation infarct. Recommend checking MRI scan under sedation. Long discussion of the bedside with the patient's son   and answered questions.  Patient's mental status and exam seems out of proportion to the tiny thalamic infarct hence recommend checking an EEG for seizure activity .Greater than 50% time during this 25 minute visit was spent on counseling and coordination of care about her stroke risk, prevention and treatment. Delia HeadyPramod [name],  MD Medical Director [facility] Pager: [phone] 09/02/2016 9:59 AM   To contact Stroke Continuity provider, please refer to [email or web address]. After hours, contact General  Neurology

## 2016-09-02 NOTE — Evaluation (Signed)
Clinical/Bedside Swallow Evaluation Patient Details  Name: Kendra Nixon MRN: [id number] Date of Birth: [birth year]  Today's Date: 09/02/2016 Time: SLP Start Time (ACUTE ONLY): 0945 SLP Stop Time (ACUTE ONLY): 1000 SLP Time Calculation (min) (ACUTE ONLY): 15 min  Past Medical History:  Past Medical History:  Diagnosis Date  . ACE-inhibitor cough   . Arthritis    "all over" (09/01/2016)  . CVA (cerebral vascular accident) (HCC)    Old left basal ganglion lacunar infarct noted per CT head (01/2013)  . CVA (cerebral vascular accident) (HCC) 08/31/2016   "verbal before; nonverbal now; weak all over now" (09/01/2016)  . Dementia    Hattie Perch/notes 08/31/2016  . Hyperlipidemia LDL goal < 100   . Hypertension   . Solitary lung nodule    4 mm right upper lobe nodule  . UTI (urinary tract infection) 08/31/2016   Hattie Perch/notes 08/31/2016   Past Surgical History:  Past Surgical History:  Procedure Laterality Date  . BREAST LUMPECTOMY Bilateral 1960   "benign"  . FEMUR IM NAIL Right 08/16/2014   Procedure: INTRAMEDULLARY (IM) NAIL FEMORAL;  Surgeon: Sheral Apleyimothy D Murphy, MD;  Location: [facility] OR;  Service: Orthopedics;  Laterality: Right;  . FRACTURE SURGERY     HPI:  Kendra Nixon an 75 y.o.femalewith PMHx prior CVA (2014), dementia, moderate malnutrition, HTN, HLD and CAD presented to the ED with family complaint of AMS, slurred and  nonsensical speech and right facial droop. MRI Acute 3 mm LEFT thalamus lacunar infarct, old left occipital infarct.    Assessment / Plan / Recommendation Clinical Impression  Pt demonstrates poor attention, awareness, visual disturbance and secretion seepage at rest requiring max verbal/tactile assist. Suspect delayed swallow initation. No cough, throat clear or wet vocal quality.  Ate regular texture without dentures prior however uanble to manipulate/masticate and cracker retrieved from oral cavity. Delayed transit with puree. Recommend Dys 1 texture (puree) and thin  liquids, straws allowed, pills crushed and full assist due to cognitive, visual impairments. Will follow.     Aspiration Risk  Moderate aspiration risk    Diet Recommendation Dysphagia 1 (Puree);Thin liquid   Liquid Administration via: Straw;Cup Medication Administration: Crushed with puree Supervision: Staff to assist with self feeding;Full supervision/cueing for compensatory strategies Compensations: Minimize environmental distractions;Slow rate;Small sips/bites;Lingual sweep for clearance of pocketing Postural Changes: Seated upright at 90 degrees    Other  Recommendations Oral Care Recommendations: Oral care BID   Follow up Recommendations Skilled Nursing facility      Frequency and Duration min 2x/week  2 weeks       Prognosis Prognosis for Safe Diet Advancement: Fair Barriers to Reach Goals: Language deficits;Cognitive deficits      Swallow Study   General HPI: Kendra Nixon an 75 y.o.femalewith PMHx prior CVA (2014), dementia, moderate malnutrition, HTN, HLD and CAD presented to the ED with family complaint of AMS, slurred and  nonsensical speech and right facial droop. MRI Acute 3 mm LEFT thalamus lacunar infarct, old left occipital infarct.  Type of Study: Bedside Swallow Evaluation Previous Swallow Assessment: none Diet Prior to this Study: NPO Temperature Spikes Noted: No Respiratory Status: Room air History of Recent Intubation: No Behavior/Cognition: Alert;Distractible;Requires cueing Oral Cavity Assessment: Within Functional Limits Oral Care Completed by SLP: No Oral Cavity - Dentition: Edentulous Vision: Impaired for self-feeding Self-Feeding Abilities: Needs set up;Needs assist Patient Positioning: Upright in bed Baseline Vocal Quality: Low vocal intensity Volitional Cough: Cognitively unable to elicit Volitional Swallow: Unable to elicit    Oral/Motor/Sensory Function Overall Oral Motor/Sensory  Function: Generalized oral weakness Facial ROM:  (low  tone)   Ice Chips Ice chips: Not tested   Thin Liquid Thin Liquid: Impaired Oral Phase Impairments: Reduced labial seal Oral Phase Functional Implications: Right anterior spillage;Left anterior spillage Pharyngeal  Phase Impairments: Suspected delayed Swallow    Nectar Thick Nectar Thick Liquid: Not tested   Honey Thick Honey Thick Liquid: Not tested   Puree Puree: Impaired Oral Phase Impairments: Reduced labial seal Oral Phase Functional Implications:  (labial residue) Pharyngeal Phase Impairments: Suspected delayed Swallow   Solid   GO   Solid: Impaired Oral Phase Impairments: Impaired mastication;Poor awareness of bolus Pharyngeal Phase Impairments:  (pulled out of mouth- unable to transit)    Functional Assessment Tool Used: skilled clinical observation Functional Limitations: Swallowing Swallow Current Status ([id number]): At least 60 percent but less than 80 percent impaired, limited or restricted Swallow Goal Status [phone]([id number]): At least 40 percent but less than 60 percent impaired, limited or restricted   [name], Kendra Nixon 09/02/2016,10:22 AM  Kendra CoonsLisa Nixon Kendra Nixon M.Ed [employer] Pager [phone]

## 2016-09-02 NOTE — Progress Notes (Signed)
PROGRESS NOTE                                                                                                                                                                                                             Patient Demographics:    Kendra Nixon Linz, is a 75 y.o. female, DOB - 1941/01/04, WUJ:[id number]  Admit date - 08/31/2016   Admitting Physician Haydee SalterPhillip M Hobbs, MD  Outpatient Primary MD for the patient is Jeanine Luzalone, Gregory, FNP  LOS - 0  Outpatient Specialists: None  Chief Complaint  Patient presents with  . Altered Mental Status       Brief Narrative   75 year old female with prior history of CVA,  dementia, hypertension, bradycardia, moderate malnutrition and dyslipidemia who reportedly is independent of her ADLs and lives alone. One of her children came to visit her to the holidays and was living with her when she noticed that she was disoriented and was having word finding difficulty. She was also walking more slowly than her baseline. Family brought her to the ED where she was found to have elevated blood pressure of 187/90 mmHg. Head CT showed possible recent infarct in the left occipital lobe with localized cytotoxic edema. Extensive small vessel disease was noted with prior lacunar type infarct. Stroke pathway initiated and patient admitted to hospitalist service. Neurology consulted.   Subjective Evette CristalShin is still poorly verbal although more awake and alert.    Assessment/ plan   Principal Problem: Acute ischemic stroke MRI brain shows acute 3 mm left thalamus lacunar infarct. Also there is moderate to severe chronic small vessel ischemic disease. Shows or left occipital lobe/PCA territory infarct. MRA head negative for large vessel disease. Showed possible stenosis of left superior cerebellar origin and mildly stenotic right M2 origin. Stroke team following. Placed on full dose aspirin.  -Seen by SLP  and started on dysphagia level I diet. -Carotid Doppler without significant stenosis. 2-D echo shows EF of 60-65% with grade 1 diastolic dysfunction and elevated ventricular end diastolic filling pressure. -LDL of 103. Start on Lipitor. A1c of  5.  Active Problems: Acute metabolic encephalopathy Has mild underlying dementia at baseline but reportedly quite functional and living independently prior to admission. Patient required Haldol for agitation and restlessness. Stable today but still confused and poorly communicative. - unclear if acute stroke +/- hypertensive encephalopathy  contributing to her symptoms.    Sinus bradycardia Stable on the monitor with heart rate in the 50s.  Hypertensive urgency Allowing permissive blood pressure given stroke. Holding home blood pressure medications.  Moderate protein calorie malnutrition Nutrition consult.      Code Status:full code Family Communication son  at bedside  Disposition Plan  : pending improvement. PT recommends SNF. Possibly tomorrow  Barriers For Discharge :  Active symptoms  Consults  :  Stroke team  Procedures  :  CT head  MRI brain Echo Carotid doppler  DVT Prophylaxis  :  Lovenox  Lab Results  Component Value Date   PLT 129 (L) 09/01/2016    Antibiotics  :    Anti-infectives    None        Objective:   Vitals:   09/01/16 1934 09/02/16 0124 09/02/16 0514 09/02/16 1324  BP: (!) 161/85 (!) 130/92 (!) 150/77 138/67  Pulse: 65 60 63 67  Resp: 16 18 18 18   Temp: 97.5 F (36.4 C) 97.6 F (36.4 C) 97.9 F (36.6 C) 97.9 F (36.6 C)  TempSrc: Oral Oral Oral Oral  SpO2: 100% 99% 100% 100%    Wt Readings from Last 3 Encounters:  08/12/16 51.3 kg (113 lb)  03/23/16 49.5 kg (109 lb 1.9 oz)  12/18/15 49 kg (108 lb)     Intake/Output Summary (Last 24 hours) at 09/02/16 1356 Last data filed at 09/02/16 1242  Gross per 24 hour  Intake              360 ml  Output                0 ml  Net               360 ml     Physical Exam  Gen: NAD, confused and non communicative HEENT:  moist mucosa, supple neck Chest: clear b/l, no added sounds CVS: N S1&S2, no murmurs, GI: soft, NT, ND Musculoskeletal: warm, no edema CNS: poorly communicative    Data Review:    CBC  Recent Labs Lab 08/31/16 1217 09/01/16 0712  WBC 4.3 4.4  HGB 12.8 10.6*  HCT 38.8 30.8*  PLT 163 129*  MCV 83.3 81.3  MCH 27.5 28.0  MCHC 33.0 34.4  RDW 14.8 14.3    Chemistries   Recent Labs Lab 08/31/16 1217 08/31/16 2029 09/01/16 0712  NA 138  --  139  K 4.0  --  4.0  CL 105  --  107  CO2 22  --  23  GLUCOSE 78  --  72  BUN 15  --  12  CREATININE 0.91  --  0.75  CALCIUM 9.7  --  8.8*  MG  --  1.7  --   AST 29  --   --   ALT 14  --   --   ALKPHOS 60  --   --   BILITOT 1.1  --   --    ------------------------------------------------------------------------------------------------------------------  Recent Labs  09/01/16 0712  CHOL 173  HDL 64  LDLCALC 103*  TRIG 32  CHOLHDL 2.7    Lab Results  Component Value Date   HGBA1C 5.0 09/01/2016   ------------------------------------------------------------------------------------------------------------------  Recent Labs  09/01/16 1834  TSH 1.087   ------------------------------------------------------------------------------------------------------------------  Recent Labs  09/01/16 1834  VITAMINB12 359    Coagulation profile  Recent Labs Lab 08/31/16 1217  INR 0.98    No results for input(s): DDIMER in the last 72  hours.  Cardiac Enzymes No results for input(s): CKMB, TROPONINI, MYOGLOBIN in the last 168 hours.  Invalid input(s): CK ------------------------------------------------------------------------------------------------------------------ No results found for: BNP  Inpatient Medications  Scheduled Meds: .  stroke: mapping our early stages of recovery book   Does not apply Once  . aspirin  300 mg  Rectal Daily   Or  . aspirin  325 mg Oral Daily  . enoxaparin (LOVENOX) injection  40 mg Subcutaneous Q24H  . sodium chloride flush  3 mL Intravenous Q12H   Continuous Infusions: PRN Meds:.acetaminophen **OR** acetaminophen, hydrALAZINE, ondansetron **OR** ondansetron (ZOFRAN) IV  Micro Results Recent Results (from the past 240 hour(s))  Urine culture     Status: None   Collection Time: 08/31/16  2:15 PM  Result Value Ref Range Status   Specimen Description URINE, CLEAN CATCH  Final   Special Requests NONE  Final   Culture NO GROWTH  Final   Report Status 09/01/2016 FINAL  Final    Radiology Reports Dg Chest 2 View  Result Date: 08/31/2016 CLINICAL DATA:  Altered mental status today EXAM: CHEST  2 VIEW COMPARISON:  Chest x-ray of 06/03/2014 FINDINGS: No active infiltrate or effusion is seen. The frontal view is slightly rotated. There is no change in the appearance of the somewhat high positioned aortic arch, and mild cardiomegaly is stable. No bony abnormality is seen IMPRESSION: No active cardiopulmonary disease. Electronically Signed   By: Dwyane Dee M.D.   On: 08/31/2016 13:18   Ct Head Wo Contrast  Result Date: 08/31/2016 CLINICAL DATA:  Altered mental status ; right facial debris EXAM: CT HEAD WITHOUT CONTRAST TECHNIQUE: Contiguous axial images were obtained from the base of the skull through the vertex without intravenous contrast. COMPARISON:  Jan 29, 2013 FINDINGS: Brain: Mild motion artifact makes this study less than optimal. There is mild diffuse atrophy. There is no intracranial mass, hemorrhage, extra-axial fluid collection, or midline shift. There is an age uncertain infarct in the medial left occipital lobe which was not present previously and may well represent a recent infarct in this area. Elsewhere there is extensive small vessel disease throughout the centra semiovale bilaterally. There is evidence of a prior infarct involving a portion of the posterior aspect of  the head of the caudate nucleus on the left in the adjacent anterior limb and genu of the left internal capsule. Decreased attenuation is noted in each thalamus with evidence of a focal prior infarct involving the anterior left thalamus. Vascular: There is no hyperdense vessel evident. There is calcification in each carotid siphon region. Skull: Bony calvarium appears intact. Sinuses/Orbits: There is a retention cyst in the left maxillary antrum. There is mucosal thickening in several ethmoid air cells bilaterally. Visualized orbits appear symmetric bilaterally. Other: Mastoid air cells appear clear. IMPRESSION: Suspect recent infarct in the posterior left occipital lobe with localized cytotoxic edema. 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 There is small vessel disease in each thalamus, chronic and stable. There is no demonstrable mass, hemorrhage, or extra-axial fluid collection. There are foci of vascular calcification. There are scattered foci of paranasal sinus disease. Note that motion artifact makes this study somewhat less than optimal. Electronically Signed   By: Bretta Bang III M.D.   On: 08/31/2016 16:16   Mr Brain Wo Contrast  Result Date: 09/01/2016 CLINICAL DATA:  Confusion and agitation. Word-finding difficulty and incontinence. Assess stroke. History of hypertension, hyperlipidemia, dementia. EXAM: MRI HEAD WITHOUT CONTRAST MRA HEAD WITHOUT CONTRAST TECHNIQUE: Multiplanar, multiecho pulse sequences of the brain and surrounding structures were obtained without intravenous contrast. Angiographic images of the head were obtained using MRA technique without contrast. COMPARISON:  CT HEAD August 31, 2016 FINDINGS: MRI HEAD FINDINGS BRAIN: 3 mm focus of reduced diffusion LEFT posterior thalamus with low ADC  values. Numerous chronic microhemorrhage in the deep gray nuclei associated with chronic hypertension. Old LEFT occipital lobe infarct with susceptibility artifact most compatible with mineralization. Multiple old bilateral basal ganglia and thalamus lacunar infarcts. Mild ex vacuo dilatation LEFT lateral ventricle, no hydrocephalus. Patchy to confluent supratentorial and pontine white matter FLAIR T2 hyperintensities without midline shift, mass effect or masses. VASCULAR: Dolichoectatic major intracranial vascular flow voids present at skull base. SKULL AND UPPER CERVICAL SPINE: No abnormal sellar expansion. No suspicious calvarial bone marrow signal. Craniocervical junction maintained. Moderate C3-4 broad-based disc osteophyte complex resulting in moderate canal stenosis with ventral cord deformity. Trace LEFT atlanto axial joint effusion. SINUSES/ORBITS: Trace paranasal sinus mucosal thickening. Mildly atretic LEFT maxillary sinus most compatible with chronic sinusitis. Mastoid air cells are well aerated. Status post bilateral ocular lens implants. The included ocular globes and orbital contents are non-suspicious. OTHER: Patient is edentulous. MRA HEAD FINDINGS ANTERIOR CIRCULATION: Normal flow related enhancement of the included cervical, petrous, cavernous and supraclinoid internal carotid arteries. Patent anterior communicating artery with like coli fenestration. Normal flow related enhancement of the anterior and middle cerebral arteries, including distal segments. Mild probable stenosis RIGHT M2 origin. Asymmetrically decreased number of distal RIGHT middle cerebral artery segments, attributed to slight patient tilt in the scanner. No large vessel occlusion, high-grade stenosis, abnormal luminal irregularity, aneurysm. POSTERIOR CIRCULATION: LEFT vertebral artery is dominant. Dolicoectatic basilar artery. Basilar artery is patent, with flow related enhancement of the main branch vessels. Likely stenotic  LEFT superior cerebellar origin. Normal flow related enhancement of the posterior cerebral arteries. No large vessel occlusion, high-grade stenosis, abnormal luminal irregularity, aneurysm. IMPRESSION: MRI HEAD: Acute 3 mm LEFT thalamus lacunar infarct. Findings of chronic hypertension including moderate to severe chronic small vessel ischemic disease and old deep gray nuclei lacunar infarcts. 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 MRA HEAD: No emergent large vessel occlusion. Suspected stenosis LEFT superior cerebellar origin and mildly stenotic RIGHT M2 origin. Electronically Signed   By: Awilda Metro M.D.   On: 09/01/2016 15:52   Mr [name] Head/brain ZO Cm  Result Date: 09/01/2016 CLINICAL DATA:  Confusion and agitation. Word-finding difficulty and incontinence. Assess stroke. History of hypertension, hyperlipidemia, dementia. EXAM: MRI HEAD WITHOUT CONTRAST MRA HEAD WITHOUT CONTRAST TECHNIQUE: Multiplanar, multiecho pulse sequences of the brain and surrounding structures were obtained without intravenous contrast. Angiographic images of the head were obtained using MRA technique without contrast. COMPARISON:  CT HEAD August 31, 2016 FINDINGS: MRI HEAD FINDINGS BRAIN: 3 mm focus of reduced diffusion LEFT posterior thalamus with low ADC values. Numerous chronic microhemorrhage in the deep gray nuclei associated with chronic hypertension. Old LEFT occipital lobe infarct with susceptibility artifact most compatible with mineralization. Multiple old bilateral basal ganglia and thalamus lacunar infarcts. Mild ex vacuo dilatation LEFT lateral ventricle, no hydrocephalus. Patchy to confluent supratentorial and pontine white matter FLAIR T2 hyperintensities without midline shift, mass effect or masses. VASCULAR: Dolichoectatic major intracranial vascular flow voids present at skull base. SKULL AND UPPER CERVICAL SPINE: No abnormal sellar expansion. No suspicious calvarial bone marrow signal.  Craniocervical  junction maintained. Moderate C3-4 broad-based disc osteophyte complex resulting in moderate canal stenosis with ventral cord deformity. Trace LEFT atlanto axial joint effusion. SINUSES/ORBITS: Trace paranasal sinus mucosal thickening. Mildly atretic LEFT maxillary sinus most compatible with chronic sinusitis. Mastoid air cells are well aerated. Status post bilateral ocular lens implants. The included ocular globes and orbital contents are non-suspicious. OTHER: Patient is edentulous. MRA HEAD FINDINGS ANTERIOR CIRCULATION: Normal flow related enhancement of the included cervical, petrous, cavernous and supraclinoid internal carotid arteries. Patent anterior communicating artery with like coli fenestration. Normal flow related enhancement of the anterior and middle cerebral arteries, including distal segments. Mild probable stenosis RIGHT M2 origin. Asymmetrically decreased number of distal RIGHT middle cerebral artery segments, attributed to slight patient tilt in the scanner. No large vessel occlusion, high-grade stenosis, abnormal luminal irregularity, aneurysm. POSTERIOR CIRCULATION: LEFT vertebral artery is dominant. Dolicoectatic basilar artery. Basilar artery is patent, with flow related enhancement of the main branch vessels. Likely stenotic LEFT superior cerebellar origin. Normal flow related enhancement of the posterior cerebral arteries. No large vessel occlusion, high-grade stenosis, abnormal luminal irregularity, aneurysm. IMPRESSION: MRI HEAD: Acute 3 mm LEFT thalamus lacunar infarct. Findings of chronic hypertension including moderate to severe chronic small vessel ischemic disease and old deep gray nuclei lacunar infarcts. 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 MRA HEAD: No emergent large vessel occlusion. Suspected stenosis LEFT superior cerebellar origin and mildly stenotic RIGHT M2 origin. Electronically Signed   By: Awilda Metro M.D.   On: 09/01/2016 15:52    Time  Spent in minutes  25   Eddie North M.D on 09/02/2016 at 1:56 PM  Between 7am to 7pm - Pager - [phone]  After 7pm go to [email or web address] - password [facility]  [facility] -  Office  [phone]

## 2016-09-02 NOTE — Evaluation (Signed)
Occupational Therapy Evaluation Patient Details Name: Kendra Nixon MRN: [id number] DOB: 03/02/1941 Today's Date: 09/02/2016    History of Present Illness 75 yo female with onset of weakness and noted L occipital lobe stroke of recent origin, chronic brain changes but mostly caudate nucleus and internal capsule of L anterior thalamus.  Has acute encephalopathy, PMHx:  dementia, CVA, lung nodule   Clinical Impression   Pt was independent in self care, meal prep and light housekeeping prior to admission. Presents with impaired cognition, generalized weakness and poor standing balance. She requires total to max assist for all ADL. Recommending SNF for further rehab. Will follow acutely.    Follow Up Recommendations  SNF;Supervision/Assistance - 24 hour    Equipment Recommendations       Recommendations for Other Services       Precautions / Restrictions Precautions Precautions: Fall      Mobility Bed Mobility               General bed mobility comments: pt in chair  Transfers Overall transfer level: Needs assistance   Transfers: Sit to/from Stand;Stand Pivot Transfers Sit to Stand: Min assist;+2 physical assistance Stand pivot transfers: Min assist;+2 physical assistance       General transfer comment: B hand held assist to rise, guide and balance    Balance Overall balance assessment: Needs assistance   Sitting balance-Leahy Scale: Fair       Standing balance-Leahy Scale: Poor                              ADL Overall ADL's : Needs assistance/impaired Eating/Feeding: Total assistance;Sitting   Grooming: Wash/dry face;Sitting;Maximal assistance Grooming Details (indicate cue type and reason): wiping mouth with eating Upper Body Bathing: Total assistance;Sitting   Lower Body Bathing: Total assistance;Sit to/from stand   Upper Body Dressing : Total assistance;Sitting   Lower Body Dressing: Total assistance;Sit to/from stand   Toilet  Transfer: Minimal assistance;+2 for safety/equipment;Ambulation   Toileting- Clothing Manipulation and Hygiene: Total assistance;Sit to/from stand               Vision Additional Comments: no apparent vision deficits   Perception     Praxis      Pertinent Vitals/Pain Pain Assessment: Faces Faces Pain Scale: No hurt Pain Intervention(s): Monitored during session     Hand Dominance Left   Extremity/Trunk Assessment Upper Extremity Assessment Upper Extremity Assessment: Generalized weakness   Lower Extremity Assessment Lower Extremity Assessment: Defer to PT evaluation   Cervical / Trunk Assessment Cervical / Trunk Assessment: Kyphotic   Communication Communication Communication: Expressive difficulties (unintellible to OT)   Cognition Arousal/Alertness: Awake/alert Behavior During Therapy: Flat affect Overall Cognitive Status: Impaired/Different from baseline Area of Impairment: Orientation;Attention;Memory;Following commands;Safety/judgement;Awareness;Problem solving Orientation Level: Disoriented to;Place;Time;Situation Current Attention Level: Sustained Memory: Decreased short-term memory Following Commands: Follows one step commands inconsistently Safety/Judgement: Decreased awareness of safety;Decreased awareness of deficits Awareness: Intellectual (pre) Problem Solving: Slow processing;Decreased initiation;Difficulty sequencing;Requires verbal cues;Requires tactile cues     General Comments       Exercises       Shoulder Instructions      Home Living Family/patient expects to be discharged to:: Skilled nursing facility Living Arrangements: Alone Available Help at Discharge: Family;Available PRN/intermittently;Personal care attendant Type of Home: Apartment                              Lives  With: Alone    Prior Functioning/Environment Level of Independence: Independent with assistive device(s)        Comments: used SPC,  independent in ADL, does not drive, has an aide, but family reports she does very little for pt        OT Problem List: Decreased strength;Decreased activity tolerance;Impaired balance (sitting and/or standing);Decreased coordination;Decreased cognition;Decreased knowledge of use of DME or AE;Decreased safety awareness;Impaired UE functional use   OT Treatment/Interventions: Self-care/ADL training;Balance training;Patient/family education;Cognitive remediation/compensation;Therapeutic activities    OT Goals(Current goals can be found in the care plan section) Acute Rehab OT Goals Patient Stated Goal: will live with daughter after rehab OT Goal Formulation: Patient unable to participate in goal setting ADL Goals Pt Will Perform Eating: with min assist;sitting Pt Will Perform Grooming: with mod assist;sitting Pt Will Transfer to Toilet: with min assist;ambulating;bedside commode (over toilet) Additional ADL Goal #1: Pt will follow one step commands with 50% accuracy.  OT Frequency: Min 2X/week   Barriers to D/C:            Co-evaluation              End of Session Equipment Utilized During Treatment: Gait belt  Activity Tolerance: Patient tolerated treatment well Patient left: in chair;with family/visitor present   Time: 1200-1216 OT Time Calculation (min): 16 min Charges:  OT General Charges $OT Visit: 1 Procedure OT Evaluation $OT Eval Moderate Complexity: 1 Procedure G-Codes: OT G-codes **NOT FOR INPATIENT CLASS** Functional Assessment Tool Used: clinical judgement Functional Limitation: Self care Self Care Current Status ([id number]): 100 percent impaired, limited or restricted Self Care Goal Status ([id number](G8988): At least 40 percent but less than 60 percent impaired, limited or restricted  Evern [employer], [place] 09/02/2016, 2:08 PM [phone]

## 2016-09-02 NOTE — [place] FL2 (Signed)
[place] MEDICAID FL2 LEVEL OF CARE SCREENING TOOL     IDENTIFICATION  Patient Name: Kendra Nixon Birthdate: 28-Aug-1941 Sex: female Admission Date (Current Location): 08/31/2016  [facility] [place]   Facility and Address:  [facility], [address], [place], [place] [address]      Provider Number: [id number]  Attending Physician Name and Address:  Eddie NorthNishant Dhungel, MD  Relative Name and Phone Number:  Gladney,Veronica-Daughter: [phone] (mobile); Eifler,Scottie-Son; [phone] (mobile)      Current Level of Care: Hospital Recommended Level of Care: Skilled Nursing Facility Prior Approval Number:    Date Approved/Denied:   PASRR Number: [id number] [id number] (Eff. 08/19/14)  Discharge Plan: SNF    Current Diagnoses: Patient Active Problem List   Diagnosis Date Noted  . Dementia 08/31/2016  . CVA (cerebral vascular accident) (HCC) 08/31/2016  . Osteoarthritis of right lower extremity 12/18/2015  . Memory impairment 10/31/2015  . Routine general medical examination at a health care facility 01/29/2015  . Medicare annual wellness visit, subsequent 01/29/2015  . Malnutrition of moderate degree (HCC) 08/17/2014  . Fracture, intertrochanteric, right femur (HCC) 08/15/2014  . Hyperlipidemia 08/15/2014  . Essential hypertension 08/15/2014  . Tobacco use 08/15/2014  . Femur fracture (HCC)   . PAC (premature atrial contraction) 01/30/2013  . Syncope 01/29/2013  . Sinus bradycardia 01/29/2013  . First degree heart block 01/29/2013  . History of CVA (cerebrovascular accident)   . Pulmonary nodule 11/04/2012  . Cough 11/04/2012  . Solitary pulmonary nodule 11/03/2012    Orientation RESPIRATION BLADDER Height & Weight      (Patient disoriented X4)  Normal Incontinent Weight:   Height:     BEHAVIORAL SYMPTOMS/MOOD NEUROLOGICAL BOWEL NUTRITION STATUS      Continent Diet (DYS 1)  AMBULATORY STATUS COMMUNICATION OF NEEDS Skin    Extensive Assist (Mod assist per PT) Verbally Normal                       Personal Care Assistance Level of Assistance  Bathing, Feeding, Dressing Bathing Assistance: Maximum assistance Feeding assistance: Maximum assistance Dressing Assistance: Maximum assistance     Functional Limitations Info  Sight, Hearing, Speech Sight Info: Adequate Hearing Info: Adequate Speech Info: Impaired (Slurred speech, incomprehensible)    SPECIAL CARE FACTORS FREQUENCY  PT (By licensed PT), OT (By licensed OT), Speech therapy     PT Frequency: Evaluated 12/27 and a minimum of 3X per week therapy recommended OT Frequency: Evaluated 12/28 and a minimum of 2X per week therapy recommended     Speech Therapy Frequency: Evaluated 12/27 and DYS 1 diet recommended      Contractures Contractures Info: Not present    Additional Factors Info  Code Status, Allergies Code Status Info: Full  Allergies Info: No known allergies           Current Medications (09/02/2016):  This is the current hospital active medication list Current Facility-Administered Medications  Medication Dose Route Frequency Provider Last Rate Last Dose  .  stroke: mapping our early stages of recovery book   Does not apply Once Russella DarAllison L Ellis, NP      . acetaminophen (TYLENOL) tablet 650 mg  650 mg Oral Q6H PRN Russella DarAllison L Ellis, NP       Or  . acetaminophen (TYLENOL) suppository 650 mg  650 mg Rectal Q6H PRN Russella DarAllison L Ellis, NP      . aspirin suppository 300 mg  300 mg Rectal Daily Revonda StandardAllison L  Rennis HardingEllis, NP   300 mg at 09/01/16 1011   Or  . aspirin tablet 325 mg  325 mg Oral Daily Russella DarAllison L Ellis, NP   325 mg at 09/02/16 1035  . atorvastatin (LIPITOR) tablet 20 mg  20 mg Oral q1800 Nishant Dhungel, MD      . enoxaparin (LOVENOX) injection 40 mg  40 mg Subcutaneous Q24H Russella DarAllison L Ellis, NP   40 mg at 09/01/16 2204  . feeding supplement (BOOST / RESOURCE BREEZE) liquid 1 Container  1 Container Oral Q24H Nishant Dhungel, MD       . Melene Muller[START ON 09/03/2016] feeding supplement (ENSURE ENLIVE) (ENSURE ENLIVE) liquid 237 mL  237 mL Oral BID BM Nishant Dhungel, MD      . hydrALAZINE (APRESOLINE) injection 10 mg  10 mg Intravenous Q4H PRN Russella DarAllison L Ellis, NP      . ondansetron [facility](ZOFRAN) tablet 4 mg  4 mg Oral Q6H PRN Russella DarAllison L Ellis, NP       Or  . ondansetron [facility](ZOFRAN) injection 4 mg  4 mg Intravenous Q6H PRN Russella DarAllison L Ellis, NP      . sodium chloride flush (NS) 0.9 % injection 3 mL  3 mL Intravenous Q12H Russella DarAllison L Ellis, NP   3 mL at 09/02/16 1000     Discharge Medications: Please see discharge summary for a list of discharge medications.  Relevant Imaging Results:  Relevant Lab Results:   Additional Information ss#[id number].  Cristobal Goldmannrawford, Ivania Teagarden Bradley, LCSW

## 2016-09-02 NOTE — Progress Notes (Signed)
Speech Language Pathology Treatment: Cognitive-Linquistic  Patient Details Name: Kendra Nixon MRN: [id number] DOB: 18-Apr-1941 Today's Date: 09/02/2016 Time: 1001-1010 SLP Time Calculation (min) (ACUTE ONLY): 9 min  Assessment / Plan / Recommendation Clinical Impression  During and after bedside swallow assessment SLP focused on language intervention. Pt did not move lips or initiate vocalization during singing activity with verbal can tactile stimulation. Tracked therapist from right to left in room. Unable to name functional objects. Son present and educated on language techniques and projected path to communication improvements. Continue tx.   HPI HPI: Kendra Hamburgunice Ingramis an 75 y.o.femalewith PMHx prior CVA (2014), dementia, moderate malnutrition, HTN, HLD and CAD presented to the ED with family complaint of AMS, slurred and  nonsensical speech and right facial droop. MRI Acute 3 mm LEFT thalamus lacunar infarct, old left occipital infarct.       SLP Plan  Continue with current plan of care     Recommendations  Medication Administration: Crushed with puree Compensations: Minimize environmental distractions;Slow rate;Small sips/bites;Lingual sweep for clearance of pocketing                Oral Care Recommendations: Oral care BID Follow up Recommendations: Skilled Nursing facility Plan: Continue with current plan of care       GO           Functional Assessment Tool Used: skilled clinical observation Functional Limitations: Spoken language expressive Swallow Current Status ([id number](G8996): At least 80 percent but less than 100 percent impaired, limited or restricted Swallow Goal Status [phone](G8997): At least 60 percent but less than 80 percent impaired, limited or restricted    [name], Krystyna Cleckley Willis 09/02/2016, 10:27 AM   [name] [name] M.Ed [employer]SLP Pager [phone]

## 2016-09-02 NOTE — Progress Notes (Signed)
EEG Completed; Results Pending  

## 2016-09-02 NOTE — Procedures (Signed)
EEG report  09/02/16 Referring Physician- Delia HeadyPramod Sethi  Indication/History [age] female with history of CVA and dementia presented with altered mental status  Medications acetaminophen (TYLENOL) tablet 650 mg  aspirin suppository 300 mg  aspirin tablet 325 mg  atorvastatin (LIPITOR) tablet 20 mg  enoxaparin (LOVENOX) injection 40 mg  hydrALAZINE (APRESOLINE) injection 10 mg  ondansetron (ZOFRAN) injection 4 mg  sodium chloride flush (NS) 0.9 % injection 3 mL   Technical This is a multichannel digital EEG recording using the international 10-20 placement system performed during awake and drowsy state  Description of the recording Posterior dominant rhythm is 7-8hz  but not well sustained Generalized slowing observed during drowsiness At times excessive blinking artifact observed Epileptiform features not seen during this recording  Impression The EEG is abnormal and findings are consistent with generalized cerebral dysfunction. Epileptiform features not seen

## 2016-09-03 DIAGNOSIS — R001 Bradycardia, unspecified: Secondary | ICD-10-CM

## 2016-09-03 DIAGNOSIS — I639 Cerebral infarction, unspecified: Secondary | ICD-10-CM | POA: Diagnosis present

## 2016-09-03 DIAGNOSIS — I6381 Other cerebral infarction due to occlusion or stenosis of small artery: Secondary | ICD-10-CM | POA: Diagnosis present

## 2016-09-03 DIAGNOSIS — I63312 Cerebral infarction due to thrombosis of left middle cerebral artery: Secondary | ICD-10-CM

## 2016-09-03 MED ORDER — QUETIAPINE FUMARATE 25 MG PO TABS: 25.0000 mg | ORAL_TABLET | Freq: Every day | ORAL | Status: DC

## 2016-09-03 MED ORDER — ASPIRIN 325 MG PO TABS: 325.0000 mg | ORAL_TABLET | Freq: Every day | ORAL | 0 refills | Status: DC

## 2016-09-03 MED ORDER — QUETIAPINE FUMARATE 25 MG PO TABS: 25.0000 mg | ORAL_TABLET | Freq: Every day | ORAL | 0 refills | Status: DC

## 2016-09-03 MED ORDER — ATORVASTATIN CALCIUM 20 MG PO TABS: 20.0000 mg | ORAL_TABLET | Freq: Every day | ORAL | 0 refills | Status: DC

## 2016-09-03 NOTE — Care Management Note (Signed)
Case Management Note  Patient Details  Name: Kendra Nixon MRN: [id number] Date of Birth: July 11, 1941  Subjective/Objective:                    Action/Plan: [facility] No further needs per CM.  Expected Discharge Date:                  Expected Discharge Plan:  Skilled Nursing Facility  In-House Referral:     Discharge planning Services     Post Acute Care Choice:    Choice offered to:     DME Arranged:    DME Agency:     HH Arranged:    HH Agency:     Status of Service:  Completed, signed off  If discussed at [employer] Length of Stay Meetings, dates discussed:    Additional Comments:  Kermit BaloKelli F Deyonna Fitzsimmons, RN 09/03/2016, 4:18 PM

## 2016-09-03 NOTE — Progress Notes (Signed)
STROKE TEAM PROGRESS NOTE   HISTORY OF PRESENT ILLNESS (per record) Kendra Nixon is an 75 y.o. female who presented to the ED with family complaint of AMS beginning yesterday. She had been incontinent of urine with difficulty ambulating, right facial droop and slurred speech. Also with perseverative nonsensical speech and wandering behavior in the house. She has a history of dementia, but at baseline she speaks normally and lives independently.   Family description of her cognitive worsening is consistent with a subacute course: On Sunday she was "slightly disoriented and as the evening wore on the patient was unable to complete sentences appropriately, she was having difficulty finding the right words to say, she was incontinent of either bowel or bladder 3.She was also noted to place her shoes on first and then attempt to place the socks over the shoes. She did not have a definitive gait disturbance but was walking more slowly than her baseline usual. Her confusion persisted to the point she was becoming somewhat agitated and restless prompting her family to bring her to the ER."  PMHx also includes prior CVA, moderate malnutrition, HTN, HLD and CAD.  Patient was not administered IV t-PA. She was admitted for further evaluation and treatment.   SUBJECTIVE (INTERVAL HISTORY) No significant changes. For discharge to SNF today.   OBJECTIVE Temp:  [97.8 F (36.6 C)-97.9 F (36.6 C)] 97.8 F (36.6 C) (12/29 0042) Pulse Rate:  [63-75] 63 (12/29 0042) Cardiac Rhythm: Normal sinus rhythm;Heart block (12/29 0754) Resp:  [18-20] 20 (12/29 0042) BP: (138-185)/(67-100) 148/98 (12/29 0042) SpO2:  [95 %-100 %] 97 % (12/29 0042)  CBC:   Recent Labs Lab 08/31/16 1217 09/01/16 0712  WBC 4.3 4.4  HGB 12.8 10.6*  HCT 38.8 30.8*  MCV 83.3 81.3  PLT 163 129*    Basic Metabolic Panel:   Recent Labs Lab 08/31/16 1217 08/31/16 2029 09/01/16 0712  NA 138  --  139  K 4.0  --  4.0  CL  105  --  107  CO2 22  --  23  GLUCOSE 78  --  72  BUN 15  --  12  CREATININE 0.91  --  0.75  CALCIUM 9.7  --  8.8*  MG  --  1.7  --   PHOS  --  3.0  --     Lipid Panel:     Component Value Date/Time   CHOL 173 09/01/2016 0712   TRIG 32 09/01/2016 0712   HDL 64 09/01/2016 0712   CHOLHDL 2.7 09/01/2016 0712   VLDL 6 09/01/2016 0712   LDLCALC 103 (H) 09/01/2016 0712   HgbA1c:  Lab Results  Component Value Date   HGBA1C 5.0 09/01/2016   Urine Drug Screen: No results found for: LABOPIA, COCAINSCRNUR, LABBENZ, AMPHETMU, Abner Greenspan    IMAGING  Mr Brain Wo Contrast  Result Date: 09/01/2016 CLINICAL DATA:  Confusion and agitation. Word-finding difficulty and incontinence. Assess stroke. History of hypertension, hyperlipidemia, dementia. EXAM: MRI HEAD WITHOUT CONTRAST MRA HEAD WITHOUT CONTRAST TECHNIQUE: Multiplanar, multiecho pulse sequences of the brain and surrounding structures were obtained without intravenous contrast. Angiographic images of the head were obtained using MRA technique without contrast. COMPARISON:  CT HEAD August 31, 2016 FINDINGS: MRI HEAD FINDINGS BRAIN: 3 mm focus of reduced diffusion LEFT posterior thalamus with low ADC values. Numerous chronic microhemorrhage in the deep gray nuclei associated with chronic hypertension. Old LEFT occipital lobe infarct with susceptibility artifact most compatible with mineralization. Multiple old bilateral basal ganglia and  thalamus lacunar infarcts. Mild ex vacuo dilatation LEFT lateral ventricle, no hydrocephalus. Patchy to confluent supratentorial and pontine white matter FLAIR T2 hyperintensities without midline shift, mass effect or masses. VASCULAR: Dolichoectatic major intracranial vascular flow voids present at skull base. SKULL AND UPPER CERVICAL SPINE: No abnormal sellar expansion. No suspicious calvarial bone marrow signal. Craniocervical junction maintained. Moderate C3-4 broad-based disc osteophyte complex resulting  in moderate canal stenosis with ventral cord deformity. Trace LEFT atlanto axial joint effusion. SINUSES/ORBITS: Trace paranasal sinus mucosal thickening. Mildly atretic LEFT maxillary sinus most compatible with chronic sinusitis. Mastoid air cells are well aerated. Status post bilateral ocular lens implants. The included ocular globes and orbital contents are non-suspicious. OTHER: Patient is edentulous. MRA HEAD FINDINGS ANTERIOR CIRCULATION: Normal flow related enhancement of the included cervical, petrous, cavernous and supraclinoid internal carotid arteries. Patent anterior communicating artery with like coli fenestration. Normal flow related enhancement of the anterior and middle cerebral arteries, including distal segments. Mild probable stenosis RIGHT M2 origin. Asymmetrically decreased number of distal RIGHT middle cerebral artery segments, attributed to slight patient tilt in the scanner. No large vessel occlusion, high-grade stenosis, abnormal luminal irregularity, aneurysm. POSTERIOR CIRCULATION: LEFT vertebral artery is dominant. Dolicoectatic basilar artery. Basilar artery is patent, with flow related enhancement of the main branch vessels. Likely stenotic LEFT superior cerebellar origin. Normal flow related enhancement of the posterior cerebral arteries. No large vessel occlusion, high-grade stenosis, abnormal luminal irregularity, aneurysm. IMPRESSION: MRI HEAD: Acute 3 mm LEFT thalamus lacunar infarct. Findings of chronic hypertension including moderate to severe chronic small vessel ischemic disease and old deep gray nuclei lacunar infarcts. 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 MRA HEAD: No emergent large vessel occlusion. Suspected stenosis LEFT superior cerebellar origin and mildly stenotic RIGHT M2 origin. Electronically Signed   By: Awilda Metroourtnay  Bloomer M.D.   On: 09/01/2016 15:52   Mr Maxine GlennMra Head/brain ZOWo Cm  Result Date: 09/01/2016 CLINICAL DATA:  Confusion and agitation.  Word-finding difficulty and incontinence. Assess stroke. History of hypertension, hyperlipidemia, dementia. EXAM: MRI HEAD WITHOUT CONTRAST MRA HEAD WITHOUT CONTRAST TECHNIQUE: Multiplanar, multiecho pulse sequences of the brain and surrounding structures were obtained without intravenous contrast. Angiographic images of the head were obtained using MRA technique without contrast. COMPARISON:  CT HEAD August 31, 2016 FINDINGS: MRI HEAD FINDINGS BRAIN: 3 mm focus of reduced diffusion LEFT posterior thalamus with low ADC values. Numerous chronic microhemorrhage in the deep gray nuclei associated with chronic hypertension. Old LEFT occipital lobe infarct with susceptibility artifact most compatible with mineralization. Multiple old bilateral basal ganglia and thalamus lacunar infarcts. Mild ex vacuo dilatation LEFT lateral ventricle, no hydrocephalus. Patchy to confluent supratentorial and pontine white matter FLAIR T2 hyperintensities without midline shift, mass effect or masses. VASCULAR: Dolichoectatic major intracranial vascular flow voids present at skull base. SKULL AND UPPER CERVICAL SPINE: No abnormal sellar expansion. No suspicious calvarial bone marrow signal. Craniocervical junction maintained. Moderate C3-4 broad-based disc osteophyte complex resulting in moderate canal stenosis with ventral cord deformity. Trace LEFT atlanto axial joint effusion. SINUSES/ORBITS: Trace paranasal sinus mucosal thickening. Mildly atretic LEFT maxillary sinus most compatible with chronic sinusitis. Mastoid air cells are well aerated. Status post bilateral ocular lens implants. The included ocular globes and orbital contents are non-suspicious. OTHER: Patient is edentulous. MRA HEAD FINDINGS ANTERIOR CIRCULATION: Normal flow related enhancement of the included cervical, petrous, cavernous and supraclinoid internal carotid arteries. Patent anterior communicating artery with like coli fenestration. Normal flow related  enhancement of the anterior and middle cerebral arteries, including distal segments.  Mild probable stenosis RIGHT M2 origin. Asymmetrically decreased number of distal RIGHT middle cerebral artery segments, attributed to slight patient tilt in the scanner. No large vessel occlusion, high-grade stenosis, abnormal luminal irregularity, aneurysm. POSTERIOR CIRCULATION: LEFT vertebral artery is dominant. Dolicoectatic basilar artery. Basilar artery is patent, with flow related enhancement of the main branch vessels. Likely stenotic LEFT superior cerebellar origin. Normal flow related enhancement of the posterior cerebral arteries. No large vessel occlusion, high-grade stenosis, abnormal luminal irregularity, aneurysm. IMPRESSION: MRI HEAD: Acute 3 mm LEFT thalamus lacunar infarct. Findings of chronic hypertension including moderate to severe chronic small vessel ischemic disease and old deep gray nuclei lacunar infarcts. 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 MRA HEAD: No emergent large vessel occlusion. Suspected stenosis LEFT superior cerebellar origin and mildly stenotic RIGHT M2 origin. Electronically Signed   By: Awilda Metroourtnay  Bloomer M.D.   On: 09/01/2016 15:52       PHYSICAL EXAM Frail elderly African-American lady not in distress.  . Afebrile. Head is nontraumatic. Neck is supple without bruit.    Cardiac exam no murmur or gallop. Lungs are clear to auscultation. Distal pulses are well felt. Neurological Exam :  Awake and alert but disoriented to time and place. She will not follow any commands. Speaks only few words and occasional short sentences. She does not blink to threat on either side. She will not follow gaze in all directions. Fundi were not visualized. 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 Face is symmetric. Tongue is midline. She is able to move all 4 extremities against gravity in a symmetric fashion. No focal weakness noted. Deep tendon reflexes are symmetric. Plantars downgoing.  She withdraws to painful stimuli equally in all 4 extremities. Gait was not tested. ASSESSMENT/PLAN Ms. Windell Mouldingunice Hedglin is a 75 y.o. female with history of dementia, hypertension, CVA presenting with altered mental status, incontinence, difficulty ambulating, R facial droop and slurred speech. She did not receive IV t-PA.   Stroke:  Possible L thalamic infarct.   Neurological exam appears to be disproportionate to an isolated left occipital infarct and suspect bilateral posterior circulation infarcts  Presenting CT L occipital with cytotoxic edema  MRI  Left thalamic infarct   MRA   No emergent large vessel occlusion.  Carotid Doppler  The vertebral arteries appear patent with antegrade flow. - Findings consistent with a 1-39 percent stenosis involving the   right internal carotid artery and the left internal carotid    artery. 2D Echo Left ventricle: The cavity size was normal. There was moderate   concentric hypertrophy. Systolic function was normal. The   estimated ejection fraction was in the range of 60% to 65%. Wall   motion was normal; there were no regional wall motion    abnormalities   EEG pending   LDL 102  HgbA1c 5.0  Lovenox 40 mg sq daily for VTE prophylaxis DIET - DYS 1 Room service appropriate? Yes; Fluid consistency: Thin  aspirin 81 mg daily prior to admission, now on aspirin 300 mg suppository daily  Ongoing aggressive stroke risk factor management  Therapy recommendations:  pending   Disposition:  pending   Hypertension  Stable Permissive hypertension (OK if < 220/120) but gradually normalize in 5-7 days Long-term BP goal normotensive  Hyperlipidemia  Home meds:  mevacor 49   Resume in hospital once able to swallow  LDL 102, goal < 70  Continue statin at discharge  Other Stroke Risk Factors  Advanced age  Former Cigarette smoker  Hx stroke/TIA  Old  L basal ganglia infarct on CT in 2014  Other Active Problems  Baseline dementia, but  still able to live alone PTA  sinus bradycardia  Malnutrition of a moderate degree  Hospital day # 1  I have personally examined this patient, reviewed notes, independently viewed imaging studies, participated in medical decision making and plan of care.ROS completed by me personally and pertinent positives fully documented  I have made any additions or clarifications directly to the above note. I had a long discussion with the patient and multiple family members and suspect she has baseline dementia which has become more obvious now with the stress of the present illness. Short stains for rehabilitation rehabilitation. Greater than 50% time during this 25 minute visit was spent on counseling and coordination of care about stroke and dementia Discussed with Dr. Gonzella Lex. Stroke team will sign off. Kindly call for questions.  Delia Heady, MD Medical Director [facility] Pager: [phone] 09/03/2016 1:12 PM   To contact Stroke Continuity provider, please refer to [email or web address]. After hours, contact General Neurology

## 2016-09-03 NOTE — Discharge Summary (Addendum)
Physician Discharge Summary  Kendra Nixon ZOX:[id number] DOB: 12-19-1940 DOA: 08/31/2016  PCP: Jeanine Luz, FNP  Admit date: 08/31/2016 Discharge date: 09/03/2016  Admitted From: Home Disposition: SNF ([facility])  Recommendations for Outpatient Follow-up:  1. Follow up with M.D. at SNF in 1 week. 2. Follow up with Dr Pearlean Brownie in 6-8 weeks 3. Please allow permissive blood pressure for next 4-5 days and then adjust blood pressure medication for tight control.   Equipment/Devices: Per therapy at SNF    Discharge Condition: Guarded CODE STATUS:Full code Diet recommendation: Dysphagia level I with thin liquid, crush pills   Discharge Diagnoses:  Principal Problem:   CVA (cerebral vascular accident) (HCC)  Active Problems:   Acute left thalamus infarct   Acute delirium   Dementia   Sinus bradycardia   History of CVA (cerebrovascular accident)   Hyperlipidemia   Essential hypertension   Malnutrition of moderate degree (HCC)  Brief narrative/history of present illness Please refer to admission H&P for details, in brief, 75 year old female with prior history of CVA,  dementia, hypertension, bradycardia, moderate malnutrition and dyslipidemia who reportedly is independent of her ADLs and lives alone. One of her children came to visit her to the holidays and was living with her when she noticed that she was disoriented and was having word finding difficulty. She was also walking more slowly than her baseline. Family brought her to the ED where she was found to have elevated blood pressure of 187/90 mmHg. Head CT showed possible recent infarct in the left occipital lobe with localized cytotoxic edema. Extensive small vessel disease was noted with prior lacunar type infarct. Stroke pathway initiated and patient admitted to hospitalist service. Neurology consulted.  Principal Problem:  Acute ischemic stroke MRI brain shows acute 3 mm left thalamus lacunar infarct. Also there  is moderate to severe chronic small vessel ischemic disease. Shows or left occipital lobe/PCA territory infarct. MRA head negative for large vessel disease. Showed possible stenosis of left superior cerebellar origin and mildly stenotic right M2 origin. Stroke team following. Switch baby aspirin to full dose aspirin. LDL of 103. Switched lovastatin to Lipitor. -Seen by SLP and started on dysphagia level I diet. -Carotid Doppler without significant stenosis. 2-D echo shows EF of 60-65% with grade 1 diastolic dysfunction and elevated ventricular end diastolic filling pressure.  A1c of  5.  Active Problems: Acute metabolic encephalopathy Has mild underlying dementia at baseline but reportedly quite functional and living independently prior to admission. -Required Haldol following admission for acute agitation. -Area of stroke is quite small to have significant neurological change.. -Unclear if she has baseline dementia which is more pronounced with acute symptoms and hospitalization versus hypertensive encephalopathy. -No signs of infection. EEG done showed generalized cerebral slowing but without epileptiform activity. -Slowly improving. Needs skilled nursing and rehabilitation. -Added bedtime Seroquel for sundowning and agitation.    Sinus bradycardia Stable on the monitor with heart rate in the 50s.  Hypertensive urgency Allowing permissive blood pressure given stroke for next 4-5 days and then need to adjust blood pressure medication for tight control. Resume valsartan upon discharge.  Moderate protein calorie malnutrition Added supplement.      Code Status:full code Family family at bedside  Disposition Plan  :  SNF for PT    Consults  :  Stroke team  Procedures  :  CT head  MRI brain Echo Carotid doppler  Discharge Instructions  Discharge Instructions    Ambulatory referral to Neurology    Complete by:  As  directed    An appointment is requested in  approximately: 6weeks     Allergies as of 09/03/2016   No Known Allergies     Medication List    STOP taking these medications   losartan-hydrochlorothiazide 100-12.5 MG tablet Commonly known as:  HYZAAR   lovastatin 40 MG tablet Commonly known as:  MEVACOR   meloxicam 7.5 MG tablet Commonly known as:  MOBIC     TAKE these medications   aspirin 325 MG tablet Take 1 tablet (325 mg total) by mouth daily. What changed:  medication strength  how much to take   atorvastatin 20 MG tablet Commonly known as:  LIPITOR Take 1 tablet (20 mg total) by mouth daily at 6 PM.   feeding supplement (ENSURE COMPLETE) Liqd Take 237 mLs by mouth 2 (two) times daily between meals.   losartan 100 MG tablet Commonly known as:  COZAAR Take 1 tablet (100 mg total) by mouth daily.   multivitamin tablet Take 1 tablet by mouth daily.   QUEtiapine 25 MG tablet Commonly known as:  SEROQUEL Take 1 tablet (25 mg total) by mouth at bedtime.       No Known Allergies      Procedures/Studies: Dg Chest 2 View  Result Date: 08/31/2016 CLINICAL DATA:  Altered mental status today EXAM: CHEST  2 VIEW COMPARISON:  Chest x-ray of 06/03/2014 FINDINGS: No active infiltrate or effusion is seen. The frontal view is slightly rotated. There is no change in the appearance of the somewhat high positioned aortic arch, and mild cardiomegaly is stable. No bony abnormality is seen IMPRESSION: No active cardiopulmonary disease. Electronically Signed   By: Dwyane Dee M.D.   On: 08/31/2016 13:18   Ct Head Wo Contrast  Result Date: 08/31/2016 CLINICAL DATA:  Altered mental status ; right facial debris EXAM: CT HEAD WITHOUT CONTRAST TECHNIQUE: Contiguous axial images were obtained from the base of the skull through the vertex without intravenous contrast. COMPARISON:  Jan 29, 2013 FINDINGS: Brain: Mild motion artifact makes this study less than optimal. There is mild diffuse atrophy. There is no intracranial  mass, hemorrhage, extra-axial fluid collection, or midline shift. There is an age uncertain infarct in the medial left occipital lobe which was not present previously and may well represent a recent infarct in this area. Elsewhere there is extensive small vessel disease throughout the centra semiovale bilaterally. There is evidence of a prior infarct involving a portion of the posterior aspect of the head of the caudate nucleus on the left in the adjacent anterior limb and genu of the left internal capsule. Decreased attenuation is noted in each thalamus with evidence of a focal prior infarct involving the anterior left thalamus. Vascular: There is no hyperdense vessel evident. There is calcification in each carotid siphon region. Skull: Bony calvarium appears intact. Sinuses/Orbits: There is a retention cyst in the left maxillary antrum. There is mucosal thickening in several ethmoid air cells bilaterally. Visualized orbits appear symmetric bilaterally. Other: Mastoid air cells appear clear. IMPRESSION: Suspect recent infarct in the posterior left occipital lobe with localized cytotoxic edema. 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 There is small vessel disease in each thalamus, chronic and stable. There is no demonstrable mass, hemorrhage, or extra-axial fluid collection. There are foci of vascular calcification. There are scattered foci of paranasal sinus  disease. Note that motion artifact makes this study somewhat less than optimal. Electronically Signed   By: Bretta BangWilliam  Woodruff III M.D.   On: 08/31/2016 16:16   Mr Brain Wo Contrast  Result Date: 09/01/2016 CLINICAL DATA:  Confusion and agitation. Word-finding difficulty and incontinence. Assess stroke. History of hypertension, hyperlipidemia, dementia. EXAM: MRI HEAD WITHOUT  CONTRAST MRA HEAD WITHOUT CONTRAST TECHNIQUE: Multiplanar, multiecho pulse sequences of the brain and surrounding structures were obtained without intravenous contrast. Angiographic images of the head were obtained using MRA technique without contrast. COMPARISON:  CT HEAD August 31, 2016 FINDINGS: MRI HEAD FINDINGS BRAIN: 3 mm focus of reduced diffusion LEFT posterior thalamus with low ADC values. Numerous chronic microhemorrhage in the deep gray nuclei associated with chronic hypertension. Old LEFT occipital lobe infarct with susceptibility artifact most compatible with mineralization. Multiple old bilateral basal ganglia and thalamus lacunar infarcts. Mild ex vacuo dilatation LEFT lateral ventricle, no hydrocephalus. Patchy to confluent supratentorial and pontine white matter FLAIR T2 hyperintensities without midline shift, mass effect or masses. VASCULAR: Dolichoectatic major intracranial vascular flow voids present at skull base. SKULL AND UPPER CERVICAL SPINE: No abnormal sellar expansion. No suspicious calvarial bone marrow signal. Craniocervical junction maintained. Moderate C3-4 broad-based disc osteophyte complex resulting in moderate canal stenosis with ventral cord deformity. Trace LEFT atlanto axial joint effusion. SINUSES/ORBITS: Trace paranasal sinus mucosal thickening. Mildly atretic LEFT maxillary sinus most compatible with chronic sinusitis. Mastoid air cells are well aerated. Status post bilateral ocular lens implants. The included ocular globes and orbital contents are non-suspicious. OTHER: Patient is edentulous. MRA HEAD FINDINGS ANTERIOR CIRCULATION: Normal flow related enhancement of the included cervical, petrous, cavernous and supraclinoid internal carotid arteries. Patent anterior communicating artery with like coli fenestration. Normal flow related enhancement of the anterior and middle cerebral arteries, including distal segments. Mild probable stenosis RIGHT M2 origin. Asymmetrically  decreased number of distal RIGHT middle cerebral artery segments, attributed to slight patient tilt in the scanner. No large vessel occlusion, high-grade stenosis, abnormal luminal irregularity, aneurysm. POSTERIOR CIRCULATION: LEFT vertebral artery is dominant. Dolicoectatic basilar artery. Basilar artery is patent, with flow related enhancement of the main branch vessels. Likely stenotic LEFT superior cerebellar origin. Normal flow related enhancement of the posterior cerebral arteries. No large vessel occlusion, high-grade stenosis, abnormal luminal irregularity, aneurysm. IMPRESSION: MRI HEAD: Acute 3 mm LEFT thalamus lacunar infarct. Findings of chronic hypertension including moderate to severe chronic small vessel ischemic disease and old deep gray nuclei lacunar infarcts. 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 MRA HEAD: No emergent large vessel occlusion. Suspected stenosis LEFT superior cerebellar origin and mildly stenotic RIGHT M2 origin. Electronically Signed   By: Awilda Metroourtnay  Bloomer M.D.   On: 09/01/2016 15:52   Mr Maxine GlennMra Head/brain IOWo Cm  Result Date: 09/01/2016 CLINICAL DATA:  Confusion and agitation. Word-finding difficulty and incontinence. Assess stroke. History of hypertension, hyperlipidemia, dementia. EXAM: MRI HEAD WITHOUT CONTRAST MRA HEAD WITHOUT CONTRAST TECHNIQUE: Multiplanar, multiecho pulse sequences of the brain and surrounding structures were obtained without intravenous contrast. Angiographic images of the head were obtained using MRA technique without contrast. COMPARISON:  CT HEAD August 31, 2016 FINDINGS: MRI HEAD FINDINGS BRAIN: 3 mm focus of reduced diffusion LEFT posterior thalamus with low ADC values. Numerous chronic microhemorrhage in the deep gray nuclei associated with chronic hypertension. Old LEFT occipital lobe infarct with susceptibility artifact most compatible with mineralization. Multiple old bilateral basal ganglia and thalamus lacunar infarcts. Mild ex  vacuo dilatation LEFT lateral ventricle, no hydrocephalus. Patchy to confluent supratentorial and  pontine white matter FLAIR T2 hyperintensities without midline shift, mass effect or masses. VASCULAR: Dolichoectatic major intracranial vascular flow voids present at skull base. SKULL AND UPPER CERVICAL SPINE: No abnormal sellar expansion. No suspicious calvarial bone marrow signal. Craniocervical junction maintained. Moderate C3-4 broad-based disc osteophyte complex resulting in moderate canal stenosis with ventral cord deformity. Trace LEFT atlanto axial joint effusion. SINUSES/ORBITS: Trace paranasal sinus mucosal thickening. Mildly atretic LEFT maxillary sinus most compatible with chronic sinusitis. Mastoid air cells are well aerated. Status post bilateral ocular lens implants. The included ocular globes and orbital contents are non-suspicious. OTHER: Patient is edentulous. MRA HEAD FINDINGS ANTERIOR CIRCULATION: Normal flow related enhancement of the included cervical, petrous, cavernous and supraclinoid internal carotid arteries. Patent anterior communicating artery with like coli fenestration. Normal flow related enhancement of the anterior and middle cerebral arteries, including distal segments. Mild probable stenosis RIGHT M2 origin. Asymmetrically decreased number of distal RIGHT middle cerebral artery segments, attributed to slight patient tilt in the scanner. No large vessel occlusion, high-grade stenosis, abnormal luminal irregularity, aneurysm. POSTERIOR CIRCULATION: LEFT vertebral artery is dominant. Dolicoectatic basilar artery. Basilar artery is patent, with flow related enhancement of the main branch vessels. Likely stenotic LEFT superior cerebellar origin. Normal flow related enhancement of the posterior cerebral arteries. No large vessel occlusion, high-grade stenosis, abnormal luminal irregularity, aneurysm. IMPRESSION: MRI HEAD: Acute 3 mm LEFT thalamus lacunar infarct. Findings of chronic  hypertension including moderate to severe chronic small vessel ischemic disease and old deep gray nuclei lacunar infarcts. 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 MRA HEAD: No emergent large vessel occlusion. Suspected stenosis LEFT superior cerebellar origin and mildly stenotic RIGHT M2 origin. Electronically Signed   By: Awilda Metro M.D.   On: 09/01/2016 15:52    2-D echo Study Conclusions  - Left ventricle: The cavity size was normal. There was moderate   concentric hypertrophy. Systolic function was normal. The   estimated ejection fraction was in the range of 60% to 65%. Wall   motion was normal; there were no regional wall motion   abnormalities. Doppler parameters are consistent with abnormal   left ventricular relaxation (grade 1 diastolic dysfunction).   Doppler parameters are consistent with elevated ventricular   end-diastolic filling pressure. - Aortic valve: Trileaflet; normal thickness leaflets. There was no   regurgitation. - Aortic root: The aortic root was normal in size. - Mitral valve: Structurally normal valve. - Right ventricle: The cavity size was normal. Wall thickness was   normal. Systolic function was normal. - Tricuspid valve: There was moderate regurgitation. - Pulmonic valve: There was no regurgitation. - Pulmonary arteries: Systolic pressure was mildly increased. PA   peak pressure: 31 mm Hg (S). - Inferior vena cava: The vessel was normal in size. - Pericardium, extracardiac: There was no pericardial effusion.  EEG Impression The EEG is abnormal and findings are consistent with generalized cerebral dysfunction. Epileptiform features not seen  Subjective:   Discharge Exam: Vitals:   09/03/16 0900 09/03/16 1300  BP: (!) 171/110 (!) 159/94  Pulse: 73 77  Resp: (!) 22 18  Temp: 98 F (36.7 C) 98.2 F (36.8 C)   Vitals:   09/03/16 0042 09/03/16 0256 09/03/16 0900 09/03/16 1300  BP: (!) 148/98  (!) 171/110 (!) 159/94  Pulse: 63   73 77  Resp: 20  (!) 22 18  Temp: 97.8 F (36.6 C)  98 F (36.7 C) 98.2 F (36.8 C)  TempSrc: Axillary  Axillary Axillary  SpO2: 97%  100% 100%  Height:  5\' 4"  (1.626 m)      Gen: NAD, confused and non communicative HEENT:  moist mucosa, supple neck Chest: clear b/l, no added sounds CVS: N S1&S2, no murmurs, GI: soft, NT, ND Musculoskeletal: warm, no edema CNS: More alert but still poorly communicative,Poorly responding to command, left facial droop, tries to mumble a few words.    The results of significant diagnostics from this hospitalization (including imaging, microbiology, ancillary and laboratory) are listed below for reference.     Microbiology: Recent Results (from the past 240 hour(s))  Urine culture     Status: None   Collection Time: 08/31/16  2:15 PM  Result Value Ref Range Status   Specimen Description URINE, CLEAN CATCH  Final   Special Requests NONE  Final   Culture NO GROWTH  Final   Report Status 09/01/2016 FINAL  Final     Labs: BNP (last 3 results) No results for input(s): BNP in the last 8760 hours. Basic Metabolic Panel:  Recent Labs Lab 08/31/16 1217 08/31/16 2029 09/01/16 0712  NA 138  --  139  K 4.0  --  4.0  CL 105  --  107  CO2 22  --  23  GLUCOSE 78  --  72  BUN 15  --  12  CREATININE 0.91  --  0.75  CALCIUM 9.7  --  8.8*  MG  --  1.7  --   PHOS  --  3.0  --    Liver Function Tests:  Recent Labs Lab 08/31/16 1217  AST 29  ALT 14  ALKPHOS 60  BILITOT 1.1  PROT 6.9  ALBUMIN 3.8   No results for input(s): LIPASE, AMYLASE in the last 168 hours. No results for input(s): AMMONIA in the last 168 hours. CBC:  Recent Labs Lab 08/31/16 1217 09/01/16 0712  WBC 4.3 4.4  HGB 12.8 10.6*  HCT 38.8 30.8*  MCV 83.3 81.3  PLT 163 129*   Cardiac Enzymes: No results for input(s): CKTOTAL, CKMB, CKMBINDEX, TROPONINI in the last 168 hours. BNP: Invalid input(s): POCBNP CBG: No results for input(s): GLUCAP in the last 168  hours. D-Dimer No results for input(s): DDIMER in the last 72 hours. Hgb A1c  Recent Labs  09/01/16 0712  HGBA1C 5.0   Lipid Profile  Recent Labs  09/01/16 0712  CHOL 173  HDL 64  LDLCALC 103*  TRIG 32  CHOLHDL 2.7   Thyroid function studies  Recent Labs  09/01/16 1834  TSH 1.087   Anemia work up  Recent Labs  09/01/16 1834  VITAMINB12 359   Urinalysis    Component Value Date/Time   COLORURINE YELLOW 08/31/2016 1415   APPEARANCEUR CLEAR 08/31/2016 1415   LABSPEC 1.020 08/31/2016 1415   PHURINE 5.0 08/31/2016 1415   GLUCOSEU NEGATIVE 08/31/2016 1415   HGBUR NEGATIVE 08/31/2016 1415   BILIRUBINUR NEGATIVE 08/31/2016 1415   KETONESUR NEGATIVE 08/31/2016 1415   PROTEINUR NEGATIVE 08/31/2016 1415   UROBILINOGEN 1.0 01/30/2013 0322   NITRITE NEGATIVE 08/31/2016 1415   LEUKOCYTESUR NEGATIVE 08/31/2016 1415   Sepsis Labs Invalid input(s): PROCALCITONIN,  WBC,  LACTICIDVEN Microbiology Recent Results (from the past 240 hour(s))  Urine culture     Status: None   Collection Time: 08/31/16  2:15 PM  Result Value Ref Range Status   Specimen Description URINE, CLEAN CATCH  Final   Special Requests NONE  Final   Culture NO GROWTH  Final   Report Status 09/01/2016 FINAL  Final  Time coordinating discharge: Over 30 minutes  SIGNED:   Eddie NorthHUNGEL, Araceli Arango, MD  [facility] 09/03/2016, 2:05 PM Pager   If 7PM-7AM, please contact night-coverage [email or web address] Password [email or web address]

## 2016-09-03 NOTE — Clinical Social Work Note (Signed)
Clinical Social Work Assessment  Patient Details  Name: Kendra Nixon MRN: [id number] Date of Birth: June 29, 1941  Date of referral:  09/03/16               Reason for consult:  Facility Placement                Permission sought to share information with:  Family Supports Permission granted to share information::  No (Patient disoriented x4)  Name::     Kendra Nixon  Agency::     Relationship::  Daughter  Contact Information:  [phone]  Housing/Transportation Living arrangements for the past 2 months:  Apartment Source of Information:  Adult Children Kendra Gilford(Veronica Nixon) Patient Interpreter Needed:  None Criminal Activity/Legal Involvement Pertinent to Current Situation/Hospitalization:  No - Comment as needed Significant Relationships:  Other Family Members, Adult Children Lives with:  Self (Patient lives at [address]) Do you feel safe going back to the place where you live?  No (Daughter in agreement with rehab) Need for family participation in patient care:  Yes (Comment)  Care giving concerns:  Daughter in agreement with ST rehab for patient before she returns home.   Social Worker assessment / plan: CSW talked with patient's daughter Suzette BattiestVeronica regarding discharge plans and recommendation of ST rehab. Daughter advised CSW that the facility [facility] as patient has been there before.  Employment status:  Retired Database administratornsurance information:  Managed Medicare, Medicaid In [employer]) PT Recommendations:  Skilled Nursing Facility Information / Referral to community resources:  Skilled Nursing Facility (Information not  needed as daughter provided facility preference)  Patient/Family's Response to care: No concerns expressed regarding care during hospitalization.  Patient/Family's Understanding of and Emotional Response to Diagnosis, Current Treatment, and Prognosis:  Not discussed.  Emotional Assessment Appearance:  Appears stated  age Attitude/Demeanor/Rapport:  Other (Appropriate) Affect (typically observed):  Appropriate Orientation:   (Patient disoriented x4) Alcohol / Substance use:  Tobacco Use, Alcohol Use, Illicit Drugs (Patient reports that she quit smoking 10/27/12, and does not drink or use illicit drugs) Psych involvement (Current and /or in the community):  No (Comment)  Discharge Needs  Concerns to be addressed:  Discharge Planning Concerns Readmission within the last 30 days:  No Current discharge risk:  None Barriers to Discharge:  No Barriers Identified   Cristobal GoldmannCrawford, Yaron Grasse Bradley, LCSW 09/03/2016, 5:55 PM

## 2016-09-03 NOTE — Clinical Social Work Placement (Signed)
   CLINICAL SOCIAL WORK PLACEMENT  NOTE 09/03/16 - DISCHARGED TO [facility] VIA AMBULANCE   Date:  09/03/2016  Patient Details  Name: Kendra Nixon MRN: [id number] Date of Birth: 03-Oct-1940  Clinical Social Work is seeking post-discharge placement for this patient at the Skilled  Nursing Facility level of care (*CSW will initial, date and re-position this form in  chart as items are completed):  No (Daughter provided CSW with facility preference)   Patient/family provided with [facility] Clinical Social Work Department's list of facilities offering this level of care within the geographic area requested by the patient (or if unable, by the patient's family).  Yes   Patient/family informed of their freedom to choose among providers that offer the needed level of care, that participate in Medicare, Medicaid or managed care program needed by the patient, have an available bed and are willing to accept the patient.  No   Patient/family informed of [facility]'s ownership interest in [facility] and [facility], as well as of the fact that they are under no obligation to receive care at these facilities.  PASRR submitted to EDS on       PASRR number received on       Existing PASRR number confirmed on 09/03/16     FL2 transmitted to all facilities in geographic area requested by pt/family on 09/03/16     FL2 transmitted to all facilities within larger geographic area on       Patient informed that his/her managed care company has contracts with or will negotiate with certain facilities, including the following:        Yes   Patient/family informed of bed offers received.  [facility]     Physician recommends and patient chooses bed at      [facility].  Patient to be transferred to facility by Ambulance     Patient family notified on 09/03/16 of transfer.  Name of family member  notified:  Daughter Suzette BattiestVeronica during assessment conversation      PHYSICIAN       Additional Comment:    _______________________________________________ Cristobal Goldmannrawford, Shekina Cordell Bradley, LCSW 09/03/2016, 5:59 PM

## 2016-10-14 ENCOUNTER — Telehealth: Payer: Self-pay | Admitting: Emergency Medicine

## 2016-10-14 NOTE — Telephone Encounter (Signed)
Gave verbal ok for tomorrow per Tammy SoursGreg

## 2016-10-14 NOTE — Telephone Encounter (Signed)
Ok for tomorrow

## 2016-10-14 NOTE — Telephone Encounter (Signed)
[facility] called and states they have a delay on going out to see patient until tomorrow. Would you like them to still go out and see her or offer her another agency. Please advise thanks.

## 2016-10-15 ENCOUNTER — Telehealth: Payer: Self-pay | Admitting: Family

## 2016-10-15 NOTE — Telephone Encounter (Signed)
Pam call from [facility] request verbal continue to see pt for nursing 1 wk 1 and 2 wk 4 for health education and med management. Please call her back

## 2016-10-19 ENCOUNTER — Encounter: Payer: Self-pay | Admitting: Family

## 2016-10-19 ENCOUNTER — Inpatient Hospital Stay: Payer: Medicare Other | Admitting: Family

## 2016-10-19 ENCOUNTER — Ambulatory Visit (INDEPENDENT_AMBULATORY_CARE_PROVIDER_SITE_OTHER): Payer: Medicare Other | Admitting: Family

## 2016-10-19 VITALS — BP 158/82 | HR 63 | Temp 97.4°F | Resp 18 | Ht 64.0 in | Wt 117.0 lb

## 2016-10-19 DIAGNOSIS — I10 Essential (primary) hypertension: Secondary | ICD-10-CM

## 2016-10-19 DIAGNOSIS — E782 Mixed hyperlipidemia: Secondary | ICD-10-CM

## 2016-10-19 DIAGNOSIS — F0390 Unspecified dementia without behavioral disturbance: Secondary | ICD-10-CM | POA: Diagnosis not present

## 2016-10-19 DIAGNOSIS — I6381 Other cerebral infarction due to occlusion or stenosis of small artery: Secondary | ICD-10-CM

## 2016-10-19 DIAGNOSIS — I639 Cerebral infarction, unspecified: Secondary | ICD-10-CM

## 2016-10-19 NOTE — Progress Notes (Signed)
Subjective:    Patient ID: Kendra Nixon, female    DOB: [birth year], 76 y.o.   MRN: [id number]  Chief Complaint  Patient presents with  . Follow-up    follow up from rehab, dementia is worse     HPI:  Kendra Nixon is a 76 y.o. female who  has a past medical history of ACE-inhibitor cough; Arthritis; CVA (cerebral vascular accident) [facility]); CVA (cerebral vascular accident) ([place]) (08/31/2016); Dementia; Hyperlipidemia LDL goal < 100; Hypertension; Solitary lung nodule; and UTI (urinary tract infection) (08/31/2016). and presents today for a follow up office visit.   Recently evaluated in the emergency department and admitted to the hospital with the chief complaintOf altered mental status incontinence with urine, slurring of speech and having difficulty ambulating. Physical exam she was found to be babbling incoherently and not consistently following commands with no focal deficits. Urinalysis was negative. CT scan with suspected recent infarct in the posterior left occipital lobe and extensive small vessel disease throughout the left caudate nuclei and adjacent internal capsule. MRI of the brain showed acute 3 mm left lacunar infarct with moderate to severe chronic small vessel ischemic disease. There is also a left occipital lobe/PCA territory infarct. MRA of the head was negative for large vessel disease. She was switched to full dose aspirin. Carotid Dopplers without significant stenosis. 2-D echo showed ejection fraction of 60-65% with grade 1 diastolic dysfunction. She did require Haldol for acute metabolic encephalopathy due to agitation. Blood pressure was allowed to be elevated for 4-5 days with recommended adjustment of blood pressure medication for tight control. She was sent to skilled nursing facility for physical therapy. She was started on circumflex well for sundowning and agitation. All hospital records, labs, imaging were reviewed in detail.   Since leaving the hospital and the  rehabilitation facility family reports that she continues to be more confused which was noted in the hospital with concern for new symptoms of stroke or progression of her baseline dementia. Family reports that she can feed herself using her hands with utensil use on occasion and needs to be fed on occasion. She needs assistance with all other activities of daily living. Does show wondering and poor judgement. Does on occasion have aggressive behavior at times. She can ambulate with a cane. At the rehabilitation facility she was kept in a wheelchair. CNA care prior was staying a couple of hours. Stays with her daughter and she needs to go to work and requesting assistance.     No Known Allergies    Outpatient Medications Prior to Visit  Medication Sig Dispense Refill  . aspirin 325 MG tablet Take 1 tablet (325 mg total) by mouth daily. 30 tablet 0  . atorvastatin (LIPITOR) 20 MG tablet Take 1 tablet (20 mg total) by mouth daily at 6 PM. 30 tablet 0  . feeding supplement, ENSURE COMPLETE, (ENSURE COMPLETE) LIQD Take 237 mLs by mouth 2 (two) times daily between meals. 5688 mL 1  . losartan (COZAAR) 100 MG tablet Take 1 tablet (100 mg total) by mouth daily. 90 tablet 1  . Multiple Vitamin (MULTIVITAMIN) tablet Take 1 tablet by mouth daily.    . QUEtiapine (SEROQUEL) 25 MG tablet Take 1 tablet (25 mg total) by mouth at bedtime. 15 tablet 0   No facility-administered medications prior to visit.       Past Surgical History:  Procedure Laterality Date  . BREAST LUMPECTOMY Bilateral 1960   "benign"  . FEMUR IM NAIL Right 08/16/2014  Procedure: INTRAMEDULLARY (IM) NAIL FEMORAL;  Surgeon: Renette Butters, MD;  Location: [facility];  Service: Orthopedics;  Laterality: Right;  . FRACTURE SURGERY        Past Medical History:  Diagnosis Date  . ACE-inhibitor cough   . Arthritis    "all over" (09/01/2016)  . CVA (cerebral vascular accident) ([place])    Old left basal ganglion lacunar infarct noted  per CT head (01/2013)  . CVA (cerebral vascular accident) ([place]) 08/31/2016   "verbal before; nonverbal now; weak all over now" (09/01/2016)  . Dementia    Archie Endo 08/31/2016  . Hyperlipidemia LDL goal < 100   . Hypertension   . Solitary lung nodule    4 mm right upper lobe nodule  . UTI (urinary tract infection) 08/31/2016   Archie Endo 08/31/2016      Review of Systems  Unable to perform ROS: Dementia      Objective:    BP (!) 158/82 (BP Location: Left Arm, Patient Position: Sitting, Cuff Size: Normal)   Pulse 63   Temp 97.4 F (36.3 C) (Oral)   Resp 18   Ht _0  (1.626 m)   Wt 117 lb (53.1 kg)   SpO2 95%   BMI 20.08 kg/m  Nursing note and vital signs reviewed.  Physical Exam  Constitutional: She appears well-developed and well-nourished. No distress.  Cardiovascular: Normal rate, regular rhythm, normal heart sounds and intact distal pulses.   Pulmonary/Chest: Effort normal and breath sounds normal.  Neurological: She is alert. She is disoriented.  Follows occasional commands.  Skin: Skin is warm and dry.  Psychiatric: She has a normal mood and affect. Her behavior is normal. Judgment and thought content normal.       Assessment & Plan:   Problem List Items Addressed This Visit      Cardiovascular and Mediastinum   Essential hypertension    Blood pressure remains elevated today above goal 140/90 with current regimen and no adverse side effects. Unable to determine headache. No new symptoms of end organ damage done physical exam. Discussed importance of maintaining blood pressure below 140/90 to reduce future stroke risk. Continue current dosage of losartan monitor blood pressure at home. Follow low-sodium diet. If blood pressure remains elevated consider additional medication.      Relevant Orders   Comp Met (CMET)   CVA (cerebral vascular accident) ([place]) - Primary     Nervous and Auditory   Dementia    No evidence of delirium with continued symptoms of dementia  with occasional agitation and maintained on Seroquel. Encourage maintaining day/night schedule. Family requesting CNA for assistance with activities of daily living and safety. Patient does not appear to have good judgment and only follows commands on occasion. Question extension of dementia versus new thalamic stroke. Continue to monitor.      Thalamic infarct, acute ([place])    New thalamic infarct appears stable with increased level of disorientation and inability to complete her activities of daily living without assistance. Family is requesting additional assistance for certified nursing assistant and she currently lives with her daughter and there is concern for safety risk. She has also been noted to wander with concern for safety. Continue to work with risk factor reduction for blood pressure and cholesterol. Obtain lipid profile in 2 weeks. Blood pressure to be monitored at home and if symptoms do not improve consider additional medications to bring blood pressure below 140/90.        Other   Hyperlipidemia  Newly switched to atorvastatin. Obtain lipid profile and about 1 month to determine current status and control with goal LDL less than 70.      Relevant Orders   Lipid Profile       I am having Ms. Dalbert Batman maintain her losartan, feeding supplement (ENSURE COMPLETE), multivitamin, aspirin, atorvastatin, QUEtiapine, and glycopyrrolate.   Meds ordered this encounter  Medications  . glycopyrrolate (ROBINUL) 1 MG tablet    Sig: Take 1 mg by mouth 3 (three) times daily.     Follow-up: Return in about 1 month (around 11/16/2016), or if symptoms worsen or fail to improve.  Mauricio Po, FNP

## 2016-10-19 NOTE — Assessment & Plan Note (Signed)
Blood pressure remains elevated today above goal 140/90 with current regimen and no adverse side effects. Unable to determine headache. No new symptoms of end organ damage done physical exam. Discussed importance of maintaining blood pressure below 140/90 to reduce future stroke risk. Continue current dosage of losartan monitor blood pressure at home. Follow low-sodium diet. If blood pressure remains elevated consider additional medication.

## 2016-10-19 NOTE — Telephone Encounter (Signed)
Gave verbal ok per Greg. 

## 2016-10-19 NOTE — Assessment & Plan Note (Signed)
Newly switched to atorvastatin. Obtain lipid profile and about 1 month to determine current status and control with goal LDL less than 70.

## 2016-10-19 NOTE — Assessment & Plan Note (Signed)
No evidence of delirium with continued symptoms of dementia with occasional agitation and maintained on Seroquel. Encourage maintaining day/night schedule. Family requesting CNA for assistance with activities of daily living and safety. Patient does not appear to have good judgment and only follows commands on occasion. Question extension of dementia versus new thalamic stroke. Continue to monitor.

## 2016-10-19 NOTE — Patient Instructions (Addendum)
Thank you for choosing [facility].  SUMMARY AND INSTRUCTIONS:  Continue to monitor blood pressure at home.   We will fill paperwork when received.   Check lipid profile in the next 2 weeks.   Medication:  Your prescription(s) have been submitted to your pharmacy or been printed and provided for you. Please take as directed and contact our office if you believe you are having problem(s) with the medication(s) or have any questions.  Labs:  Please stop by the lab on the lower level of the building for your blood work. Your results will be released to MyChart (or called to you) after review, usually within 72 hours after test completion. If any changes need to be made, you will be notified at that same time.  1.) The lab is open from 7:30am to 5:30 pm Monday-Friday 2.) No appointment is necessary 3.) Fasting (if needed) is 6-8 hours after food and drink; black coffee and water are okay   Follow up:  If your symptoms worsen or fail to improve, please contact our office for further instruction, or in case of emergency go directly to the emergency room at the closest medical facility.

## 2016-10-19 NOTE — Assessment & Plan Note (Signed)
New thalamic infarct appears stable with increased level of disorientation and inability to complete her activities of daily living without assistance. Family is requesting additional assistance for certified nursing assistant and she currently lives with her daughter and there is concern for safety risk. She has also been noted to wander with concern for safety. Continue to work with risk factor reduction for blood pressure and cholesterol. Obtain lipid profile in 2 weeks. Blood pressure to be monitored at home and if symptoms do not improve consider additional medications to bring blood pressure below 140/90.

## 2016-10-20 ENCOUNTER — Telehealth: Payer: Self-pay | Admitting: Family

## 2016-10-20 NOTE — Telephone Encounter (Signed)
Verbal Order  2 x a week for 1 week 3x a week for 3 weeks  PT for transfer and balance.  [phone] Areola- Eric

## 2016-10-21 ENCOUNTER — Telehealth: Payer: Self-pay | Admitting: Emergency Medicine

## 2016-10-21 NOTE — Telephone Encounter (Signed)
Pt wants to know if her home health care forms have been filled out. She is needing more help at home. Please advise thanks.

## 2016-10-21 NOTE — Telephone Encounter (Signed)
Please continue PT 

## 2016-10-21 NOTE — Telephone Encounter (Signed)
Spoke with daughter and let her know that the form sent over the the home care agency had been signed by another NP. It was not a blank form.     BP readings  10/20/16- 126/80   10/21/16- 130/86

## 2016-10-21 NOTE — Telephone Encounter (Signed)
LVM for eric with verbals

## 2016-10-21 NOTE — Telephone Encounter (Signed)
Tried calling pts daughter back. Mail box was full and no answer.

## 2016-10-21 NOTE — Telephone Encounter (Signed)
Ok to continue current medications with no additions. Continue to monitor blood pressures at home.

## 2016-10-22 ENCOUNTER — Telehealth: Payer: Self-pay | Admitting: Family

## 2016-10-22 NOTE — Telephone Encounter (Signed)
Wants to know if you received blank DMA 30/51 form for personal care service?

## 2016-10-22 NOTE — Telephone Encounter (Signed)
Spoke with daughter and informed her that I was given a website to print forms and that they will not be done until Monday.

## 2016-10-22 NOTE — Telephone Encounter (Signed)
Did not receive faxed form. Called to let them know.

## 2016-10-22 NOTE — Telephone Encounter (Signed)
Pt daughter called in and would like like to know if you rec some kind of fax from [employer]?   Can you call her daughter when you get a chance?

## 2016-10-25 NOTE — Telephone Encounter (Signed)
Daughter called in and wanted to know if you were able to fax this form to [facility]?

## 2016-10-26 ENCOUNTER — Telehealth: Payer: Self-pay | Admitting: Family

## 2016-10-26 NOTE — Telephone Encounter (Signed)
Harriett SineNancy [place] [facility] [place] [facility] verbal order for OT 2 times a week for 5wks.   She also wants to report that she visited Ms. Derrell Lollingngram today and pt has increase confusion, trouble walking and right side inattention. Vital sigh is good today but daughter reported high BP over the weekend.

## 2016-10-26 NOTE — Telephone Encounter (Signed)
Forms have been faxed over. Pts daughter is aware.

## 2016-10-27 NOTE — Telephone Encounter (Signed)
Gave verbal ok per Greg. 

## 2016-11-03 ENCOUNTER — Telehealth: Payer: Self-pay | Admitting: Family

## 2016-11-03 ENCOUNTER — Telehealth: Payer: Self-pay | Admitting: *Deleted

## 2016-11-03 MED ORDER — QUETIAPINE FUMARATE 25 MG PO TABS: 25.0000 mg | ORAL_TABLET | Freq: Every day | ORAL | 0 refills | Status: DC

## 2016-11-03 NOTE — Telephone Encounter (Signed)
Medication refilled

## 2016-11-03 NOTE — Telephone Encounter (Signed)
You were not the last provider to fill medication. Are you ok with refilling med? Please advise

## 2016-11-03 NOTE — Telephone Encounter (Signed)
Pt's daughter called request refill for QUEtiapine (SEROQUEL) 25 MG tablet send to [facility]. Nursing home gave this to the pt but no refill. Please advise.

## 2016-11-03 NOTE — Telephone Encounter (Signed)
Rec'd fax pt requesting refill on her Seroquel. Per chart Tammy SoursGreg has already sent to [facility]...Raechel Chute/lmb

## 2016-11-09 ENCOUNTER — Telehealth: Payer: Self-pay | Admitting: Family

## 2016-11-09 NOTE — Telephone Encounter (Signed)
Patient has elevated bp of 181/98 lying down.  Patient has been resting for half an hour.  States CNA gave meds to patient this morning.  How does this need to be addressed?

## 2016-11-09 NOTE — Telephone Encounter (Signed)
Please follow up with additional blood pressure reading. If remains elevated will increase blood pressure medications.

## 2016-11-10 ENCOUNTER — Telehealth: Payer: Self-pay | Admitting: Family

## 2016-11-10 MED ORDER — AMLODIPINE BESYLATE 10 MG PO TABS: 10.0000 mg | ORAL_TABLET | Freq: Every day | ORAL | 2 refills | Status: DC

## 2016-11-10 NOTE — Telephone Encounter (Signed)
Spoke with Kendra Nixon.  She is going to fax over the last few weeks [facility].  Daughter states patient does not have a current working BP cuff at home.

## 2016-11-10 NOTE — Telephone Encounter (Signed)
New prescription for amlodipine sent to pharmacy. Please have her follow up with blood pressures in 1 week or if they continue to remain elevated.

## 2016-11-10 NOTE — Telephone Encounter (Signed)
Just fyi, pt BP  197/101 3:30 192/10  4:20

## 2016-11-10 NOTE — Telephone Encounter (Signed)
New blood pressure medications added.

## 2016-11-10 NOTE — Telephone Encounter (Signed)
Please advise 

## 2016-11-11 NOTE — Telephone Encounter (Signed)
Pt's daughter aware.

## 2016-11-12 ENCOUNTER — Other Ambulatory Visit: Payer: Self-pay | Admitting: *Deleted

## 2016-11-12 DIAGNOSIS — I10 Essential (primary) hypertension: Secondary | ICD-10-CM

## 2016-11-12 MED ORDER — GLYCOPYRROLATE 1 MG PO TABS: 1.0000 mg | ORAL_TABLET | Freq: Three times a day (TID) | ORAL | 5 refills | Status: DC

## 2016-11-12 MED ORDER — ATORVASTATIN CALCIUM 20 MG PO TABS: 20.0000 mg | ORAL_TABLET | Freq: Every day | ORAL | 5 refills | Status: DC

## 2016-11-12 MED ORDER — LOSARTAN POTASSIUM 100 MG PO TABS: 100.0000 mg | ORAL_TABLET | Freq: Every day | ORAL | 1 refills | Status: DC

## 2016-11-15 ENCOUNTER — Other Ambulatory Visit: Payer: Self-pay | Admitting: Family

## 2016-11-15 DIAGNOSIS — S72141S Displaced intertrochanteric fracture of right femur, sequela: Secondary | ICD-10-CM

## 2016-11-16 ENCOUNTER — Ambulatory Visit: Payer: Self-pay | Admitting: Neurology

## 2016-11-16 NOTE — Telephone Encounter (Signed)
Not on pts list as currently taking

## 2016-11-18 ENCOUNTER — Encounter: Payer: Self-pay | Admitting: Family

## 2016-11-18 ENCOUNTER — Other Ambulatory Visit: Payer: Self-pay | Admitting: Family

## 2016-11-18 ENCOUNTER — Other Ambulatory Visit (INDEPENDENT_AMBULATORY_CARE_PROVIDER_SITE_OTHER): Payer: Medicare Other

## 2016-11-18 ENCOUNTER — Ambulatory Visit (INDEPENDENT_AMBULATORY_CARE_PROVIDER_SITE_OTHER): Payer: Medicare Other | Admitting: Family

## 2016-11-18 VITALS — BP 120/84 | HR 73 | Resp 16 | Ht 64.0 in | Wt 113.0 lb

## 2016-11-18 DIAGNOSIS — I10 Essential (primary) hypertension: Secondary | ICD-10-CM

## 2016-11-18 DIAGNOSIS — E782 Mixed hyperlipidemia: Secondary | ICD-10-CM | POA: Diagnosis not present

## 2016-11-18 DIAGNOSIS — R413 Other amnesia: Secondary | ICD-10-CM

## 2016-11-18 LAB — COMPREHENSIVE METABOLIC PANEL
ALT: 15 U/L (ref 0–35)
AST: 31 U/L (ref 0–37)
Albumin: 4.3 g/dL (ref 3.5–5.2)
Alkaline Phosphatase: 72 U/L (ref 39–117)
BUN: 17 mg/dL (ref 6–23)
CALCIUM: 10.3 mg/dL (ref 8.4–10.5)
CHLORIDE: 103 meq/L (ref 96–112)
CO2: 28 meq/L (ref 19–32)
Creatinine, Ser: 0.94 mg/dL (ref 0.40–1.20)
GFR: 74.07 mL/min (ref >60.00)
Glucose, Bld: 75 mg/dL (ref 70–99)
Potassium: 3.9 mEq/L (ref 3.5–5.1)
Sodium: 140 mEq/L (ref 135–145)
Total Bilirubin: 0.6 mg/dL (ref 0.2–1.2)
Total Protein: 7.7 g/dL (ref 6.0–8.3)

## 2016-11-18 LAB — LIPID PANEL
CHOL/HDL RATIO: 3
Cholesterol: 211 mg/dL — ABNORMAL HIGH (ref 0–200)
HDL: 78 mg/dL (ref >39.00)
LDL CALC: 124 mg/dL — AB (ref 0–99)
NonHDL: 132.69
TRIGLYCERIDES: 41 mg/dL (ref 0.0–149.0)
VLDL: 8.2 mg/dL (ref 0.0–40.0)

## 2016-11-18 MED ORDER — QUETIAPINE FUMARATE 25 MG PO TABS: 25.0000 mg | ORAL_TABLET | Freq: Every day | ORAL | 0 refills | Status: DC

## 2016-11-18 MED ORDER — ATORVASTATIN CALCIUM 40 MG PO TABS: 40.0000 mg | ORAL_TABLET | Freq: Every day | ORAL | 2 refills | Status: DC

## 2016-11-18 NOTE — Assessment & Plan Note (Signed)
Mood and memory appear stable with no worsening or aggression. Continue current dosage of Seroquel. Continue to monitor.

## 2016-11-18 NOTE — Assessment & Plan Note (Signed)
Hypertension appears well controlled and below goal 140/90 with addition of amlodipine and continued losartan. Physical exam without significant findings of end organ damage. Patient with increased fruits/vegetable intake which has also helped reduce her blood pressure. Continue current dosage of losartan and amlodipine. Encouraged to monitor blood pressure at home. Follow-up in 3 months or sooner if needed.

## 2016-11-18 NOTE — Patient Instructions (Addendum)
Thank you for choosing [facility].  SUMMARY AND INSTRUCTIONS:  Medication:  Please continue to take your medication as prescribed.  Your prescription(s) have been submitted to your pharmacy or been printed and provided for you. Please take as directed and contact our office if you believe you are having problem(s) with the medication(s) or have any questions.  Follow up:  If your symptoms worsen or fail to improve, please contact our office for further instruction, or in case of emergency go directly to the emergency room at the closest medical facility.      

## 2016-11-18 NOTE — Progress Notes (Signed)
Subjective:    Patient ID: Kendra Nixon, female    DOB: 08-15-1941, 76 y.o.   MRN: [id number]  Chief Complaint  Patient presents with  . Follow-up    BP meds are better     HPI:  Kendra Nixon is a 76 y.o. female who  has a past medical history of ACE-inhibitor cough; Arthritis; CVA (cerebral vascular accident) [facility]); CVA (cerebral vascular accident) ([facility]) (08/31/2016); Dementia; Hyperlipidemia LDL goal < 100; Hypertension; Solitary lung nodule; and UTI (urinary tract infection) (08/31/2016). and presents today for a follow up office visit.  Hypertension - Noted via phone of recent elevated blood pressures of 181/98, 197/101 and was started on amlodipine in addition to her previous losartan. Reports taking the medications as prescribed and denies adverse side effects or hypotensive readings. Denies worst headache of life with no symptoms of end organ damage. Working on following a low sodium diet has increased her fruits and vegetable intake.   No Known Allergies    Outpatient Medications Prior to Visit  Medication Sig Dispense Refill  . amLODipine (NORVASC) 10 MG tablet Take 1 tablet (10 mg total) by mouth daily. 30 tablet 2  . aspirin 325 MG tablet Take 1 tablet (325 mg total) by mouth daily. 30 tablet 0  . atorvastatin (LIPITOR) 20 MG tablet Take 1 tablet (20 mg total) by mouth daily at 6 PM. 30 tablet 5  . feeding supplement, ENSURE COMPLETE, (ENSURE COMPLETE) LIQD Take 237 mLs by mouth 2 (two) times daily between meals. 5688 mL 1  . glycopyrrolate (ROBINUL) 1 MG tablet Take 1 tablet (1 mg total) by mouth 3 (three) times daily. 90 tablet 5  . losartan (COZAAR) 100 MG tablet Take 1 tablet (100 mg total) by mouth daily. 90 tablet 1  . Multiple Vitamin (MULTIVITAMIN) tablet Take 1 tablet by mouth daily.    . QUEtiapine (SEROQUEL) 25 MG tablet Take 1 tablet (25 mg total) by mouth at bedtime. 30 tablet 0   No facility-administered medications prior to visit.      Review of  Systems  Unable to perform ROS: Dementia      Objective:    BP 120/84 (BP Location: Left Arm, Patient Position: Sitting, Cuff Size: Normal)   Pulse 73   Resp 16   Ht 5\' 4"  (1.626 m)   Wt 113 lb (51.3 kg)   SpO2 97%   BMI 19.40 kg/m  Nursing note and vital signs reviewed.  Physical Exam  Constitutional: She appears well-developed and well-nourished. No distress.  Cardiovascular: Normal rate, regular rhythm, normal heart sounds and intact distal pulses.   Pulmonary/Chest: Effort normal and breath sounds normal.  Neurological: She is alert. She is disoriented.  Skin: Skin is warm and dry.  Psychiatric: She has a normal mood and affect. Her behavior is normal. Judgment and thought content normal.       Assessment & Plan:   Problem List Items Addressed This Visit      Cardiovascular and Mediastinum   Essential hypertension - Primary    Hypertension appears well controlled and below goal 140/90 with addition of amlodipine and continued losartan. Physical exam without significant findings of end organ damage. Patient with increased fruits/vegetable intake which has also helped reduce her blood pressure. Continue current dosage of losartan and amlodipine. Encouraged to monitor blood pressure at home. Follow-up in 3 months or sooner if needed.        Other   Memory impairment    Mood and memory appear stable  with no worsening or aggression. Continue current dosage of Seroquel. Continue to monitor.           I am having Ms. Derrell Lollingngram maintain her feeding supplement (ENSURE COMPLETE), multivitamin, aspirin, amLODipine, atorvastatin, losartan, glycopyrrolate, and QUEtiapine.   Meds ordered this encounter  Medications  . QUEtiapine (SEROQUEL) 25 MG tablet    Sig: Take 1 tablet (25 mg total) by mouth at bedtime.    Dispense:  30 tablet    Refill:  0     Follow-up: Return in about 3 months (around 02/18/2017), or if symptoms worsen or fail to improve.  Jeanine Luzalone, Gregory,  FNP

## 2016-11-22 ENCOUNTER — Encounter: Payer: Self-pay | Admitting: Neurology

## 2016-11-24 ENCOUNTER — Telehealth: Payer: Self-pay | Admitting: Family

## 2016-11-24 NOTE — Telephone Encounter (Signed)
QUEtiapine (SEROQUEL) 25 MG tablet   Patients daughter called in and said the pharmacy can not fill this rx. She was told to call back and let you know so you could send something else in. Thank you.

## 2016-11-25 ENCOUNTER — Other Ambulatory Visit: Payer: Self-pay | Admitting: Family

## 2016-11-25 MED ORDER — CITALOPRAM HYDROBROMIDE 10 MG PO TABS: 10.0000 mg | ORAL_TABLET | Freq: Every day | ORAL | 1 refills | Status: DC

## 2016-11-25 NOTE — Telephone Encounter (Signed)
Citalopram sent to pharmacy

## 2016-11-26 NOTE — Telephone Encounter (Signed)
Patients daughter informed

## 2016-12-02 DIAGNOSIS — Z0289 Encounter for other administrative examinations: Secondary | ICD-10-CM

## 2016-12-10 DIAGNOSIS — E785 Hyperlipidemia, unspecified: Secondary | ICD-10-CM | POA: Diagnosis not present

## 2016-12-10 DIAGNOSIS — R001 Bradycardia, unspecified: Secondary | ICD-10-CM | POA: Diagnosis not present

## 2016-12-10 DIAGNOSIS — J449 Chronic obstructive pulmonary disease, unspecified: Secondary | ICD-10-CM | POA: Diagnosis not present

## 2016-12-10 DIAGNOSIS — M1991 Primary osteoarthritis, unspecified site: Secondary | ICD-10-CM | POA: Diagnosis not present

## 2016-12-10 DIAGNOSIS — F0391 Unspecified dementia with behavioral disturbance: Secondary | ICD-10-CM | POA: Diagnosis not present

## 2016-12-10 DIAGNOSIS — I69318 Other symptoms and signs involving cognitive functions following cerebral infarction: Secondary | ICD-10-CM | POA: Diagnosis not present

## 2016-12-10 DIAGNOSIS — I10 Essential (primary) hypertension: Secondary | ICD-10-CM | POA: Diagnosis not present

## 2016-12-10 DIAGNOSIS — R410 Disorientation, unspecified: Secondary | ICD-10-CM | POA: Diagnosis not present

## 2016-12-10 DIAGNOSIS — Z87891 Personal history of nicotine dependence: Secondary | ICD-10-CM | POA: Diagnosis not present

## 2016-12-16 ENCOUNTER — Telehealth: Payer: Self-pay

## 2016-12-16 NOTE — Telephone Encounter (Signed)
Glycopyrrol is not covered by insurance. Alternatives would be pantoprazole, nexium, and cimetidine. Please advise

## 2017-01-03 ENCOUNTER — Encounter: Payer: Self-pay | Admitting: Family

## 2017-01-03 ENCOUNTER — Ambulatory Visit (INDEPENDENT_AMBULATORY_CARE_PROVIDER_SITE_OTHER): Payer: Medicare Other | Admitting: Family

## 2017-01-03 VITALS — BP 108/60 | HR 60 | Resp 14 | Ht 64.0 in | Wt 114.0 lb

## 2017-01-03 DIAGNOSIS — M25511 Pain in right shoulder: Secondary | ICD-10-CM

## 2017-01-03 DIAGNOSIS — R569 Unspecified convulsions: Secondary | ICD-10-CM | POA: Insufficient documentation

## 2017-01-03 DIAGNOSIS — F0390 Unspecified dementia without behavioral disturbance: Secondary | ICD-10-CM

## 2017-01-03 MED ORDER — QUETIAPINE FUMARATE 25 MG PO TABS: 25.0000 mg | ORAL_TABLET | Freq: Every day | ORAL | 0 refills | Status: DC

## 2017-01-03 NOTE — Progress Notes (Signed)
Subjective:    Patient ID: Kendra Nixon, female    DOB: 13-Nov-1940, 76 y.o.   MRN: [id number]  Chief Complaint  Patient presents with  . Follow-up    wants seroquel sent back into pharmacy, right arm pain x1 week,     HPI:  Kendra Nixon is a 76 y.o. female who  has a past medical history of ACE-inhibitor cough; Arthritis; CVA (cerebral vascular accident) [facility]); CVA (cerebral vascular accident) (HCC) (08/31/2016); Dementia; Hyperlipidemia LDL goal < 100; Hypertension; Solitary lung nodule; and UTI (urinary tract infection) (08/31/2016). and presents today for a follow up office visit.   1.) Dementia - Previously maintained on Seroquel and changed to citalopram secondary to insurance coverage. Reports she is no longer taking the citalopram. Family notes on Celexa her mood swings have been worse. No aggressive behavior. Supervised 24 hours per day for safety.   2.) Shoulder pain - This is a new problem. Associated symptom of pain located in her right arm/shoulder has been going on for about 1 week and described as sore. Modifying factors include rubbing alcohol which has helped with her symptoms. Course of the symptoms has improved since initial onset.    3.) Concern for focal seizures - This is a new problem. Associated symptom symptom of slumping over and becoming less responsive for a period of about 10 seconds and then returns to baseline. Frequency of symptoms is about 1x per month.  No recent symptoms.    No Known Allergies    Outpatient Medications Prior to Visit  Medication Sig Dispense Refill  . amLODipine (NORVASC) 10 MG tablet Take 1 tablet (10 mg total) by mouth daily. 30 tablet 2  . aspirin 325 MG tablet Take 1 tablet (325 mg total) by mouth daily. 30 tablet 0  . atorvastatin (LIPITOR) 40 MG tablet Take 1 tablet (40 mg total) by mouth daily at 6 PM. 30 tablet 2  . feeding supplement, ENSURE COMPLETE, (ENSURE COMPLETE) LIQD Take 237 mLs by mouth 2 (two) times daily between  meals. 5688 mL 1  . glycopyrrolate (ROBINUL) 1 MG tablet Take 1 tablet (1 mg total) by mouth 3 (three) times daily. 90 tablet 5  . losartan (COZAAR) 100 MG tablet Take 1 tablet (100 mg total) by mouth daily. 90 tablet 1  . meloxicam (MOBIC) 7.5 MG tablet TAKE ONE TABLET BY MOUTH TWICE DAILY 180 tablet 0  . Multiple Vitamin (MULTIVITAMIN) tablet Take 1 tablet by mouth daily.    . citalopram (CELEXA) 10 MG tablet Take 1 tablet (10 mg total) by mouth daily. (Patient not taking: Reported on 01/03/2017) 30 tablet 1   No facility-administered medications prior to visit.       Past Surgical History:  Procedure Laterality Date  . BREAST LUMPECTOMY Bilateral 1960   "benign"  . FEMUR IM NAIL Right 08/16/2014   Procedure: INTRAMEDULLARY (IM) NAIL FEMORAL;  Surgeon: Sheral Apley, MD;  Location: [facility];  Service: Orthopedics;  Laterality: Right;  . FRACTURE SURGERY        Past Medical History:  Diagnosis Date  . ACE-inhibitor cough   . Arthritis    "all over" (09/01/2016)  . CVA (cerebral vascular accident) (HCC)    Old left basal ganglion lacunar infarct noted per CT head (01/2013)  . CVA (cerebral vascular accident) (HCC) 08/31/2016   "verbal before; nonverbal now; weak all over now" (09/01/2016)  . Dementia    Hattie Perch 08/31/2016  . Hyperlipidemia LDL goal < 100   . Hypertension   .  Solitary lung nodule    4 mm right upper lobe nodule  . UTI (urinary tract infection) 08/31/2016   Hattie Perch 08/31/2016    Wt Readings from Last 3 Encounters:  01/03/17 114 lb (51.7 kg)  11/18/16 113 lb (51.3 kg)  10/19/16 117 lb (53.1 kg)     Review of Systems  Unable to perform ROS: Dementia      Objective:    BP 108/60 (BP Location: Left Arm, Patient Position: Sitting, Cuff Size: Normal)   Pulse 60   Resp 14   Ht  (1.626 m)   Wt 114 lb (51.7 kg)   SpO2 90%   BMI 19.57 kg/m  Nursing note and vital signs reviewed.  Physical Exam  Constitutional: She appears well-developed and  well-nourished. No distress.  Cardiovascular: Normal rate, regular rhythm, normal heart sounds and intact distal pulses.   Pulmonary/Chest: Effort normal and breath sounds normal.  Musculoskeletal:  Right shoulder - no obvious deformity, discoloration, or edema. No tenderness able to be elicited. There is significant resistance and passive range of motion in all directions greater than 90. Distal pulses and sensation are intact and appropriate.  Neurological: She is alert. She is disoriented.  Skin: Skin is warm and dry.  Psychiatric: She has a normal mood and affect. Her behavior is normal. Thought content normal. Her speech is tangential. Cognition and memory are impaired. She expresses impulsivity.       Assessment & Plan:   Problem List Items Addressed This Visit      Nervous and Auditory   Dementia - Primary    Previously maintained on Seroquel with change to citalopram secondary to insurance coverage. Behavior symptoms and moods more labile with Celexa. Discontinue citalopram and restart Seroquel. Continue to monitor.      Relevant Medications   QUEtiapine (SEROQUEL) 25 MG tablet     Other   Seizure-like activity (HCC)    Description of seizure like activity lasting less than 10 seconds and occurring very infrequently. Encouraged to monitor symptoms and follow up if frequency or duration increased. To monitor.      Acute pain of right shoulder    Symptoms and exam concerning for adhesive capsulitis given limited use of shoulder. Treat conservatively with ice, home exercise therapy, and acetaminophen. If symptoms worsen or do not improve consider cortisone injection or physical therapy. Given patient's mental status physical therapy may be a challenge.          I have discontinued Ms. Caiazzo's citalopram. I am also having her start on QUEtiapine. Additionally, I am having her maintain her feeding supplement (ENSURE COMPLETE), multivitamin, aspirin, amLODipine, losartan,  glycopyrrolate, meloxicam, and atorvastatin.   Meds ordered this encounter  Medications  . QUEtiapine (SEROQUEL) 25 MG tablet    Sig: Take 1 tablet (25 mg total) by mouth at bedtime.    Dispense:  90 tablet    Refill:  0    Order Specific Question:   Supervising Provider    Answer:   Hillard Danker A [[id number]]     Follow-up: Return in about 6 months (around 07/05/2017), or if symptoms worsen or fail to improve.  Jeanine Luz, FNP

## 2017-01-03 NOTE — Assessment & Plan Note (Signed)
Symptoms and exam concerning for adhesive capsulitis given limited use of shoulder. Treat conservatively with ice, home exercise therapy, and acetaminophen. If symptoms worsen or do not improve consider cortisone injection or physical therapy. Given patient's mental status physical therapy may be a challenge.

## 2017-01-03 NOTE — Assessment & Plan Note (Signed)
Previously maintained on Seroquel with change to citalopram secondary to insurance coverage. Behavior symptoms and moods more labile with Celexa. Discontinue citalopram and restart Seroquel. Continue to monitor.

## 2017-01-03 NOTE — Patient Instructions (Signed)
Thank you for choosing [facility].  SUMMARY AND INSTRUCTIONS:  Stop taking Celexa. Restart taking Seroquel.  For the shoulder:   Ice 20 minutes every 2 hours as needed Acetaminophen 500 mg up to 4 times daily as needed for discomfort. Work on shoulder motion as able.  Follow-up if symptoms worsen or do not improve.  Medication:  Your prescription(s) have been submitted to your pharmacy or been printed and provided for you. Please take as directed and contact our office if you believe you are having problem(s) with the medication(s) or have any questions.  Follow up:  If your symptoms worsen or fail to improve, please contact our office for further instruction, or in case of emergency go directly to the emergency room at the closest medical facility.     Adhesive Capsulitis Adhesive capsulitis is inflammation of the tendons and ligaments that surround the shoulder joint (shoulder capsule). This condition causes the shoulder to become stiff and painful to move. Adhesive capsulitis is also called frozen shoulder. What are the causes? This condition may be caused by:  An injury to the shoulder joint.  Straining the shoulder.  Not moving the shoulder for a period of time. This can happen if your arm was injured or in a sling.  Long-standing health problems, such as:  Diabetes.  Thyroid problems.  Heart disease.  Stroke.  Rheumatoid arthritis.  Lung disease. In some cases, the cause may not be known. What increases the risk? This condition is more likely to develop in:  Women.  People who are older than 78 years of age. What are the signs or symptoms? Symptoms of this condition include:  Pain in the shoulder when moving the arm. There may also be pain when parts of the shoulder are touched. The pain is worse at night or when at rest.  Soreness or aching in the shoulder.  Inability to move the shoulder normally.  Muscle spasms. How is this  diagnosed? This condition is diagnosed with a physical exam and imaging tests, such as an X-ray or MRI. How is this treated? This condition may be treated with:  Treatment of the underlying cause or condition.  Physical therapy. This involves performing exercises to get the shoulder moving again.  Medicine. Medicine may be given to relieve pain, inflammation, or muscle spasms.  Steroid injections into the shoulder joint.  Shoulder manipulation. This is a procedure to move the shoulder into another position. It is done after you are given a medicine to make you fall asleep (general anesthetic). The joint may also be injected with salt water at high pressure to break down scarring.  Surgery. This may be done in severe cases when other treatments have failed. Although most people recover completely from adhesive capsulitis, some may not regain the full movement of the shoulder. Follow these instructions at home:  Take over-the-counter and prescription medicines only as told by your health care provider.  If you are being treated with physical therapy, follow instructions from your physical therapist.  Avoid exercises that put a lot of demand on your shoulder, such as throwing. These exercises can make pain worse.  If directed, apply ice to the injured area:  Put ice in a plastic bag.  Place a towel between your skin and the bag.  Leave the ice on for 20 minutes, 2-3 times per day. Contact a health care provider if:  You develop new symptoms.  Your symptoms get worse. This information is not intended to replace advice given  to you by your health care provider. Make sure you discuss any questions you have with your health care provider. Document Released: 06/20/2009 Document Revised: 01/29/2016 Document Reviewed: 12/16/2014 Elsevier Interactive Patient Education  2017 [employer].

## 2017-01-03 NOTE — Assessment & Plan Note (Signed)
Description of seizure like activity lasting less than 10 seconds and occurring very infrequently. Encouraged to monitor symptoms and follow up if frequency or duration increased. To monitor.

## 2017-02-01 ENCOUNTER — Other Ambulatory Visit: Payer: Self-pay | Admitting: Family

## 2017-02-28 ENCOUNTER — Other Ambulatory Visit: Payer: Self-pay | Admitting: Family

## 2017-02-28 DIAGNOSIS — E782 Mixed hyperlipidemia: Secondary | ICD-10-CM

## 2017-03-04 ENCOUNTER — Other Ambulatory Visit (INDEPENDENT_AMBULATORY_CARE_PROVIDER_SITE_OTHER): Payer: Medicare Other

## 2017-03-04 DIAGNOSIS — E782 Mixed hyperlipidemia: Secondary | ICD-10-CM | POA: Diagnosis not present

## 2017-03-04 LAB — LIPID PANEL
Cholesterol: 194 mg/dL (ref 0–200)
HDL: 66.5 mg/dL (ref >39.00)
LDL Cholesterol: 117 mg/dL — ABNORMAL HIGH (ref 0–99)
NONHDL: 127.45
Total CHOL/HDL Ratio: 3
Triglycerides: 54 mg/dL (ref 0.0–149.0)
VLDL: 10.8 mg/dL (ref 0.0–40.0)

## 2017-03-04 MED ORDER — ATORVASTATIN CALCIUM 80 MG PO TABS: 40.0000 mg | ORAL_TABLET | Freq: Every day | ORAL | 2 refills | Status: DC

## 2017-03-04 NOTE — Telephone Encounter (Signed)
Medication increased to 80 mg.

## 2017-03-04 NOTE — Telephone Encounter (Signed)
Pt had blood work done this morning for this refill.

## 2017-03-04 NOTE — Addendum Note (Signed)
Addended by: Jeanine LuzALONE, GREGORY D on: 03/04/2017 04:02 PM   Modules accepted: Orders

## 2017-03-19 ENCOUNTER — Other Ambulatory Visit: Payer: Self-pay | Admitting: Family

## 2017-03-19 DIAGNOSIS — S72141S Displaced intertrochanteric fracture of right femur, sequela: Secondary | ICD-10-CM

## 2017-04-09 ENCOUNTER — Other Ambulatory Visit: Payer: Self-pay | Admitting: Family

## 2017-05-02 ENCOUNTER — Other Ambulatory Visit: Payer: Self-pay | Admitting: Family

## 2017-05-16 ENCOUNTER — Other Ambulatory Visit: Payer: Self-pay | Admitting: Family

## 2017-05-16 DIAGNOSIS — I10 Essential (primary) hypertension: Secondary | ICD-10-CM

## 2017-06-18 ENCOUNTER — Other Ambulatory Visit: Payer: Self-pay | Admitting: Family

## 2017-06-18 DIAGNOSIS — S72141S Displaced intertrochanteric fracture of right femur, sequela: Secondary | ICD-10-CM

## 2017-06-20 NOTE — Telephone Encounter (Signed)
Please call pt and have her establish with new PCP for more refills. She is due to an OV.

## 2017-06-20 NOTE — Telephone Encounter (Signed)
Pt made appt please send in refill

## 2017-06-20 NOTE — Telephone Encounter (Signed)
Talked to daughter who stated patient has dementia and she will call back to set up an appointment, she was informed she will not get a refill until an appointment is set up.

## 2017-07-07 ENCOUNTER — Encounter: Payer: Self-pay | Admitting: Family Medicine

## 2017-07-07 ENCOUNTER — Ambulatory Visit (INDEPENDENT_AMBULATORY_CARE_PROVIDER_SITE_OTHER)
Admission: RE | Admit: 2017-07-07 | Discharge: 2017-07-07 | Disposition: A | Discharge status: Home / Self Care | Payer: Medicare Other | Source: Ambulatory Visit | Attending: Family Medicine | Admitting: Family Medicine

## 2017-07-07 ENCOUNTER — Ambulatory Visit (INDEPENDENT_AMBULATORY_CARE_PROVIDER_SITE_OTHER): Payer: Medicare Other | Admitting: Family Medicine

## 2017-07-07 VITALS — BP 128/62 | HR 69 | Temp 97.5°F | Ht 64.0 in

## 2017-07-07 DIAGNOSIS — M7989 Other specified soft tissue disorders: Secondary | ICD-10-CM

## 2017-07-07 DIAGNOSIS — I63312 Cerebral infarction due to thrombosis of left middle cerebral artery: Secondary | ICD-10-CM | POA: Diagnosis not present

## 2017-07-07 DIAGNOSIS — R569 Unspecified convulsions: Secondary | ICD-10-CM | POA: Diagnosis not present

## 2017-07-07 MED ORDER — CEPHALEXIN 500 MG PO CAPS: 500.0000 mg | ORAL_CAPSULE | Freq: Two times a day (BID) | ORAL | 0 refills | Status: DC

## 2017-07-07 NOTE — Assessment & Plan Note (Signed)
Having unilateral swelling of the left foot. Unclear if she has had a trauma to indicate a fracture. Patient with dementia so unable to obtain a history as to if there is pain.  - xrays - initiate keflex  - if no improvement could treat as a gout flare  - would encourage compression - independent review of xray shows previous shows history of previous fracture so possible for CRPS.

## 2017-07-07 NOTE — Assessment & Plan Note (Signed)
Has not had any follow up with neuro since her admission for CVA .  - was counseled to follow up with Dr Pearlean BrownieSethi after admission.

## 2017-07-07 NOTE — Progress Notes (Signed)
Kendra Nixon - 76 y.o. female MRN [id number]  Date of birth: 1940-10-09  SUBJECTIVE:  Including CC & ROS.  Chief Complaint  Patient presents with  . Bilateral feet swelling    Patient states this has been present for +2 or 3 weeks. She states it is painful at times and some discolaration is present.    Kendra Nixon is a 76 y.o. female that is Presenting with left foot swelling and redness. Also having ongoing seizures. The foot swelling has been present for 2-3 weeks. She has weakness ever since she had a stroke about 6 months ago. She walks around her house with a rolling walker but has to use a wheelchair when she goes out in public. She also suffers from dementia. The history was provided by her daughter. Her daughter denies any trauma to the area. She denies any fevers occurring in her mother. There is no significant swelling of the lower extremity. Denies any significant pain. Has not had a history of gout.  Has ongoing seizures that are intermittent in nature. These occur at random times and last for roughly 30 seconds. This started after her stroke. She denies any generalized body shakes. The patient will lose consciousness and become limp and then regained consciousness. She denies any tongue biting. Denies any previous history of seizures prior to the stroke.  Review of her MRI and MRA from her brain shows a 3 mm left thalamic lacunar infarct, old left occipital lobe/PCA territory infarct, and no emergent large vessel occlusion and suspected stenosis of the left superior cerebral origin and mildly stenotic right M2 origin.   Review of Systems  Cardiovascular: Negative for leg swelling.  Musculoskeletal: Positive for gait problem.  Skin: Positive for color change.  Neurological: Positive for seizures and weakness.  Psychiatric/Behavioral: Negative for agitation.    HISTORY: Past Medical, Surgical, Social, and Family History Reviewed & Updated per EMR.   Pertinent Historical Findings  include:  Past Medical History:  Diagnosis Date  . ACE-inhibitor cough   . Arthritis    "all over" (09/01/2016)  . CVA (cerebral vascular accident) (HCC)    Old left basal ganglion lacunar infarct noted per CT head (01/2013)  . CVA (cerebral vascular accident) (HCC) 08/31/2016   "verbal before; nonverbal now; weak all over now" (09/01/2016)  . Dementia    Hattie Perch 08/31/2016  . Hyperlipidemia LDL goal < 100   . Hypertension   . Solitary lung nodule    4 mm right upper lobe nodule  . UTI (urinary tract infection) 08/31/2016   Hattie Perch 08/31/2016    Past Surgical History:  Procedure Laterality Date  . BREAST LUMPECTOMY Bilateral 1960   "benign"  . FEMUR IM NAIL Right 08/16/2014   Procedure: INTRAMEDULLARY (IM) NAIL FEMORAL;  Surgeon: Sheral Apley, MD;  Location: [facility] OR;  Service: Orthopedics;  Laterality: Right;  . FRACTURE SURGERY      No Known Allergies  Family History  Problem Relation Age of Onset  . Breast cancer Sister 38  . Hypertension Unknown   . Alzheimer's disease Maternal Grandmother   . Alzheimer's disease Maternal Aunt   . Arthritis Mother   . Heart disease Mother   . Heart disease Father      Social History   Social History  . Marital status: Widowed    Spouse name: N/A  . Number of children: 3  . Years of education: 11th grade   Occupational History  . 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 Smoking status: Current Every Day Smoker    Packs/day: 0.10    Years: 30.00    Types: Cigarettes  . Smokeless tobacco: Never Used  . Alcohol use No  . Drug use: No  . Sexual activity: Not on file   Other Topics Concern  . Not on file   Social History Narrative   [place] independent living [place]. Is able to perform all ADLS and IADLs independently.     PHYSICAL EXAM:  VS: BP 128/62 (BP Location: Left Arm, Patient Position: Sitting, Cuff Size: Normal)   Pulse 69    Temp (!) 97.5 F (36.4 C) (Oral)   Ht 5\' 4"  (1.626 m)   SpO2 99%  Physical Exam Gen: NAD, alert, cooperative with exam,  ENT: normal lips, normal nasal mucosa,Lack of dentition  Eye: normal EOM, normal conjunctiva and lids CV:  no edema, +2 pedal pulses   Resp: no accessory muscle use, non-labored,  Skin: no rashes, no areas of induration  Neuro: normal tone, normal sensation to touch Psych:  No insight, alert  MSK:  Sitting in wheelchair. Left foot is swollen and generalized red. No obvious streaking of the left foot. No specific area of tenderness. Normal plantarflexion and dorsiflexion. Normal knee flexion and extension. No abnormal callus or ulcer formation on plantar aspect of the feet. Neurovascularly intact      ASSESSMENT & PLAN:   Seizure-like activity (HCC) Has had ongoing seizures where she will be unresponsive for around 30 seconds and then come back to. Denies any generalized shaking. These have only been occurring since her stroke. They are intermittent  - Referral to neurology  Foot swelling Having unilateral swelling of the left foot. Unclear if she has had a trauma to indicate a fracture. Patient with dementia so unable to obtain a history as to if there is pain.  - xrays - initiate keflex  - if no improvement could treat as a gout flare  - would encourage compression - independent review of xray shows previous shows history of previous fracture so possible for CRPS.   CVA (cerebral vascular accident) [facility](HCC) Has not had any follow up with neuro since her admission for CVA .  - was counseled to follow up with Dr Pearlean BrownieSethi after admission.

## 2017-07-07 NOTE — Patient Instructions (Signed)
Thank you for coming in,   Please take a probiotic with the antibiotic   We will call you with the results from today.    Please feel free to call with any questions or concerns at any time, at [phone]. --Dr. Jordan LikesSchmitz,

## 2017-07-07 NOTE — Assessment & Plan Note (Signed)
Has had ongoing seizures where she will be unresponsive for around 30 seconds and then come back to. Denies any generalized shaking. These have only been occurring since her stroke. They are intermittent  - Referral to neurology

## 2017-07-11 ENCOUNTER — Telehealth: Payer: Self-pay | Admitting: Family

## 2017-07-11 MED ORDER — QUETIAPINE FUMARATE 25 MG PO TABS: 25.0000 mg | ORAL_TABLET | Freq: Every day | ORAL | 0 refills | Status: DC

## 2017-07-11 NOTE — Telephone Encounter (Signed)
Per office policy sent 30 day to local pharmacy until appt.../lmb  

## 2017-07-11 NOTE — Telephone Encounter (Signed)
Pt daughter called and needs a refill of the patients QUEtiapine (SEROQUEL) 25 MG table [facility]  Please advise  POF

## 2017-07-12 ENCOUNTER — Other Ambulatory Visit: Payer: Self-pay

## 2017-07-12 MED ORDER — AMLODIPINE BESYLATE 10 MG PO TABS: 10.0000 mg | ORAL_TABLET | Freq: Every day | ORAL | 0 refills | Status: DC

## 2017-07-15 ENCOUNTER — Encounter: Payer: Self-pay | Admitting: Neurology

## 2017-07-24 ENCOUNTER — Other Ambulatory Visit: Payer: Self-pay | Admitting: Nurse Practitioner

## 2017-07-24 DIAGNOSIS — S72141S Displaced intertrochanteric fracture of right femur, sequela: Secondary | ICD-10-CM

## 2017-07-26 ENCOUNTER — Other Ambulatory Visit: Payer: Self-pay

## 2017-07-26 DIAGNOSIS — S72141S Displaced intertrochanteric fracture of right femur, sequela: Secondary | ICD-10-CM

## 2017-07-26 MED ORDER — MELOXICAM 7.5 MG PO TABS: 7.5000 mg | ORAL_TABLET | Freq: Two times a day (BID) | ORAL | 0 refills | Status: DC

## 2017-08-04 ENCOUNTER — Ambulatory Visit: Payer: Medicare Other | Admitting: Nurse Practitioner

## 2017-08-10 ENCOUNTER — Other Ambulatory Visit: Payer: Self-pay | Admitting: Family

## 2017-08-10 ENCOUNTER — Other Ambulatory Visit: Payer: Self-pay | Admitting: Nurse Practitioner

## 2017-08-10 DIAGNOSIS — E782 Mixed hyperlipidemia: Secondary | ICD-10-CM

## 2017-08-15 ENCOUNTER — Telehealth: Payer: Self-pay | Admitting: Nurse Practitioner

## 2017-08-15 DIAGNOSIS — E782 Mixed hyperlipidemia: Secondary | ICD-10-CM

## 2017-08-15 NOTE — Telephone Encounter (Signed)
[facility] - Client TELEPHONE ADVICE RECORD [facility]  Patient Name: Windell MouldingUNICE Stiverson  DOB: [birth year]    Initial Comment Caller states mom needing meds refilled, dr never sent them to pharmacy   Nurse Assessment  Nurse: Odis LusterBowers, RN, Bjorn Loserhonda Date/Time Lamount Cohen(Eastern Time): 08/15/2017 9:22:08 AM  Confirm and document reason for call. If symptomatic, describe symptoms. ---Caller states mom needing meds refilled, dr never sent them to pharmacy. She is out of her chol medication. Also Seroquel is out but the pharmacy gave her 3 days loaner dose (enough to last thru Tuesday). U[facility] [address]  Does the patient have any new or worsening symptoms? ---No  Please document clinical information provided and list any resource used. ---Advised that nurse will alert MD office of the need for refill of medication. Advised caller to call back for any new/worsening symptoms for advice/triage. Caller voiced understanding.     Guidelines    Guideline Title Affirmed Question Affirmed Notes       Final Disposition User   Clinical Call Odis LusterBowers, RN, Bjorn Loserhonda

## 2017-08-16 ENCOUNTER — Other Ambulatory Visit: Payer: Self-pay | Admitting: Nurse Practitioner

## 2017-08-16 NOTE — Telephone Encounter (Signed)
Copied from CRM [id number]. Topic: Quick Communication - See Telephone Encounter >> Aug 16, 2017  3:29 PM Waymon AmatoBurton, Donna F wrote: `CRM for notification. See Telephone encounter for pt daughter calling checking the status of her moms medication refill for lipitor and seroquel    pt has been waiting since 08/10/17 and pt was not told today when she called earlier this morning that the office was closed for today.  Best number is [phone]   08/16/17.

## 2017-08-17 ENCOUNTER — Other Ambulatory Visit: Payer: Self-pay

## 2017-08-17 ENCOUNTER — Telehealth: Payer: Self-pay | Admitting: Nurse Practitioner

## 2017-08-17 MED ORDER — QUETIAPINE FUMARATE 25 MG PO TABS: 25.0000 mg | ORAL_TABLET | Freq: Every day | ORAL | 0 refills | Status: DC

## 2017-08-17 MED ORDER — ATORVASTATIN CALCIUM 80 MG PO TABS: 40.0000 mg | ORAL_TABLET | Freq: Every day | ORAL | 0 refills | Status: DC

## 2017-08-17 NOTE — Telephone Encounter (Signed)
See previously msg refills has been sent in, and daughter was notified...Raechel Chute/lmb

## 2017-08-17 NOTE — Telephone Encounter (Signed)
Pt dtr called and inform prescription was sent in today at 10:56am.

## 2017-08-17 NOTE — Telephone Encounter (Signed)
rx has been sent 

## 2017-08-17 NOTE — Telephone Encounter (Signed)
Pt has appt w/Ash on 08/31/17. Per office policy sent 30 day to local pharmacy until appt...Raechel Chute/lmb

## 2017-08-17 NOTE — Telephone Encounter (Signed)
[facility] - Client TELEPHONE ADVICE RECORD [facility] Patient Name: Windell MouldingUNICE Crittendon DOB: Nov 08, 1940 Initial Comment Caller states, mother is out of her cholesterol Rx since last week and behavior Rx since yesterday. The pharm has already given an emergency supply. They need the refill called in. Nurse Assessment Nurse: Lane HackerHarley, RN, Elvin SoWindy Date/Time (Eastern Time): 08/17/2017 11:05:47 AM Please select the assessment type ---Refill Additional Documentation ---Caller states that her mother is out of her Atorvastatin since last week, and Seroquel (Quetiapine) since yesterday. The pharmacy has already given an emergency supply for Seroquel (3 pills were given, and one night didn't give because of unsure when would get more medicine). Need refilled. No S/S. She has been calling the office every day. Does the patient have enough medication to last until the office opens? ---Unable to obtain loaner dose from Pharmacy Does the client directives allow for assistance with medications after hours? ---Yes Was the medication filled within the last 6 months? ---Yes Additional Documentation ---OFFICE OPEN. Caller warm conf. to staff for assistance. Guidelines Guideline Title Affirmed Question Affirmed Notes Final Disposition User Clinical Call SummitHarley, RN, MedtronicWindy

## 2017-08-18 ENCOUNTER — Other Ambulatory Visit: Payer: Self-pay

## 2017-08-18 DIAGNOSIS — E782 Mixed hyperlipidemia: Secondary | ICD-10-CM

## 2017-08-19 ENCOUNTER — Other Ambulatory Visit: Payer: Self-pay | Admitting: Family

## 2017-08-19 DIAGNOSIS — I10 Essential (primary) hypertension: Secondary | ICD-10-CM

## 2017-08-19 NOTE — Telephone Encounter (Signed)
Copied from CRM [id number]. Topic: Quick Communication - Rx Refill/Question >> Aug 19, 2017  2:03 PM Louie BunPalacios Medina, Rosey Batheresa D wrote: Has the patient contacted their pharmacy? Yes (Agent: If no, request that the patient contact the pharmacy for the refill.) Preferred Pharmacy (with phone number or street name): [facility] [place] - [phone] Agent: Please be advised that RX refills may take up to 3 business days. We ask that you follow-up with your pharmacy. Patient daughter called and said that patient is completley out of her medication losartan (COZAAR) 100 MG tablet.

## 2017-08-22 ENCOUNTER — Other Ambulatory Visit: Payer: Self-pay

## 2017-08-22 DIAGNOSIS — I10 Essential (primary) hypertension: Secondary | ICD-10-CM

## 2017-08-22 MED ORDER — LOSARTAN POTASSIUM 100 MG PO TABS: 100.0000 mg | ORAL_TABLET | Freq: Every day | ORAL | 0 refills | Status: DC

## 2017-08-24 ENCOUNTER — Telehealth: Payer: Self-pay | Admitting: Nurse Practitioner

## 2017-08-24 MED ORDER — AMLODIPINE BESYLATE 10 MG PO TABS: 10.0000 mg | ORAL_TABLET | Freq: Every day | ORAL | 0 refills | Status: DC

## 2017-08-24 NOTE — Telephone Encounter (Signed)
Amlodipine refilled. Last refill on Losartan was on 08/22/17, which will be enough mediation to last until appt on 09/05/17.

## 2017-08-24 NOTE — Telephone Encounter (Signed)
Copied from CRM [id number]. Topic: Quick Communication - See Telephone Encounter >> Aug 24, 2017  2:48 PM Arlyss Gandyichardson, Anchor Dwan N, NT wrote: CRM for notification. See Telephone encounter for: Pt needing a refill of Losartan and amlodipine(will be out before her appt). Has appt on 09/05/17. Uses [facility] on [address]   08/24/17.

## 2017-09-05 ENCOUNTER — Ambulatory Visit (INDEPENDENT_AMBULATORY_CARE_PROVIDER_SITE_OTHER): Payer: Medicare Other | Admitting: Nurse Practitioner

## 2017-09-05 ENCOUNTER — Encounter: Payer: Self-pay | Admitting: Nurse Practitioner

## 2017-09-05 VITALS — BP 124/82 | HR 60 | Temp 97.5°F | Resp 16 | Ht 64.0 in

## 2017-09-05 DIAGNOSIS — F0391 Unspecified dementia with behavioral disturbance: Secondary | ICD-10-CM | POA: Diagnosis not present

## 2017-09-05 DIAGNOSIS — E782 Mixed hyperlipidemia: Secondary | ICD-10-CM

## 2017-09-05 DIAGNOSIS — I10 Essential (primary) hypertension: Secondary | ICD-10-CM

## 2017-09-05 DIAGNOSIS — M159 Polyosteoarthritis, unspecified: Secondary | ICD-10-CM

## 2017-09-05 MED ORDER — LOSARTAN POTASSIUM 100 MG PO TABS: 100.0000 mg | ORAL_TABLET | Freq: Every day | ORAL | 0 refills | Status: DC

## 2017-09-05 MED ORDER — AMLODIPINE BESYLATE 10 MG PO TABS: 10.0000 mg | ORAL_TABLET | Freq: Every day | ORAL | 0 refills | Status: DC

## 2017-09-05 MED ORDER — QUETIAPINE FUMARATE 25 MG PO TABS: 25.0000 mg | ORAL_TABLET | Freq: Every day | ORAL | 0 refills | Status: DC

## 2017-09-05 MED ORDER — ATORVASTATIN CALCIUM 80 MG PO TABS: 40.0000 mg | ORAL_TABLET | Freq: Every day | ORAL | 0 refills | Status: DC

## 2017-09-05 NOTE — Assessment & Plan Note (Signed)
BP stable on current medications. Continue. Diagnostic testing ordered: - Comprehensive metabolic panel; Future - Lipid panel; Future Medications ordered: - amLODipine (NORVASC) 10 MG tablet; Take 1 tablet (10 mg total) by mouth daily.  Dispense: 90 tablet; Refill: 0 - losartan (COZAAR) 100 MG tablet; Take 1 tablet (100 mg total) by mouth daily.  Dispense: 90 tablet; Refill: 0

## 2017-09-05 NOTE — Assessment & Plan Note (Signed)
Maintained on atorvastatin 40 daily, was unaware of order to increase dose to 80. We will recheck current labs prior to increasing dosage at this point, as last lipid panel was in June. Diagnostic testing ordered: - CBC; Future - Lipid panel; Future Medications ordered: - atorvastatin (LIPITOR) 80 MG tablet; Take 0.5 tablets (40 mg total) by mouth daily at 6 PM.  Dispense: 90 tablet; Refill: 0

## 2017-09-05 NOTE — Progress Notes (Addendum)
Subjective:    Patient ID: Kendra Nixon, female    DOB: 03/06/1941, 76 y.o.   MRN: [id number]  HPI Ms Kendra Nixon who presents today to establish care.  She Is transferring to me from another provider in the same clinic. She is requesting a refill of  Her daily medications. She has advanced dementia and lives at home with the care of her daughter, who comes to her appointment with her today to provide her history. She does have home health nursing care in her home every day. She is scheduled to establish care with a neurologist in January for a prior stroke and recent seizure like activity.  Hypertension -maintained on amlodipine 10 and losartan 100 daily. Reports daily medication compliance without observed adverse medication effects.  BP Readings from Last 3 Encounters:  09/05/17 124/82  07/07/17 128/62  01/03/17 108/60    Hypercholesterolemia- maintained on atorvastatin 40 daily. Reports medication compliance without adverse medication effects. Her atorvastatin dosage was increased to 80 in June for elevated lipid panel, but the daughter says she was not aware of this order and has continued to give her mother atorvastatin 40 daily.  Lab Results  Component Value Date   CHOL 194 03/04/2017   HDL 66.50 03/04/2017   LDLCALC 117 (H) 03/04/2017   TRIG 54.0 03/04/2017   CHOLHDL 3 03/04/2017    Osteoarthritis- has been maintained on mobic 7.5 twice daily but ran out. Daughter says she seems to do okay without the mobic, but occasionally complains of body aches. She would like to be able to give her mother something for pain if she does complain or appear uncomfortable.  Dementia with behavioral disturbance- maintained on seroquel 25 mg at bedtime. She was started on this medication some time ago due to aggression and agitation. Her daughter notices if she does not have the seroquel she will get angry and start cursing.  Daughter feels like she would not be able to care for her mother at  home without the Seroquel.  Review of Systems  Constitutional: Negative for activity change and appetite change.  Cardiovascular: Negative for leg swelling.  Musculoskeletal: Negative for arthralgias and myalgias.  Skin: Negative for rash.  Hematological: Does not bruise/bleed easily.  Psychiatric/Behavioral: Negative for agitation and behavioral problems.    See HPI  Past Medical History:  Diagnosis Date  . ACE-inhibitor cough   . Arthritis    "all over" (09/01/2016)  . CVA (cerebral vascular accident) (HCC)    Old left basal ganglion lacunar infarct noted per CT head (01/2013)  . CVA (cerebral vascular accident) (HCC) 08/31/2016   "verbal before; nonverbal now; weak all over now" (09/01/2016)  . Dementia    Hattie Perch 08/31/2016  . Hyperlipidemia LDL goal < 100   . Hypertension   . Solitary lung nodule    4 mm right upper lobe nodule  . UTI (urinary tract infection) 08/31/2016   Hattie Perch 08/31/2016     Social History   Socioeconomic History  . Marital status: Widowed    Spouse name: Not on file  . Number of children: 3  . Years of education: 11th grade  . Highest education level: Not on file  Social Needs  . Financial resource strain: Not on file  . Food insecurity - worry: Not on file  . Food insecurity - inability: Not on file  . Transportation needs - medical: Not on file  . Transportation needs - non-medical: Not on file  Occupational History  . 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 Smoking status: Current Every Day Smoker    Packs/day: 0.10    Years: 30.00    Pack years: 3.00    Types: Cigarettes  . Smokeless tobacco: Never Used  Substance and Sexual Activity  . Alcohol use: No  . Drug use: No  . Sexual activity: Not on file  Other Topics Concern  . Not on file  Social History Narrative   [place] independent living [place]. Is able to perform all ADLS and IADLs  independently.    Past Surgical History:  Procedure Laterality Date  . BREAST LUMPECTOMY Bilateral 1960   "benign"  . FEMUR IM NAIL Right 08/16/2014   Procedure: INTRAMEDULLARY (IM) NAIL FEMORAL;  Surgeon: Sheral Apleyimothy D Murphy, MD;  Location: [facility] OR;  Service: Orthopedics;  Laterality: Right;  . FRACTURE SURGERY      Family History  Problem Relation Age of Onset  . Breast cancer Sister [id number]  . Hypertension Unknown   . Alzheimer's disease Maternal Grandmother   . Alzheimer's disease Maternal Aunt   . Arthritis Mother   . Heart disease Mother   . Heart disease Father     No Known Allergies  Current Outpatient Medications on File Prior to Visit  Medication Sig Dispense Refill  . amLODipine (NORVASC) 10 MG tablet Take 1 tablet (10 mg total) by mouth daily. 30 tablet 0  . aspirin 325 MG tablet Take 1 tablet (325 mg total) by mouth daily. 30 tablet 0  . atorvastatin (LIPITOR) 80 MG tablet Take 0.5 tablets (40 mg total) by mouth daily at 6 PM. Must keep scheduled appt w/new provider for future refills 30 tablet 0  . losartan (COZAAR) 100 MG tablet Take 1 tablet (100 mg total) by mouth daily. 30 tablet 0  . meloxicam (MOBIC) 7.5 MG tablet Take 1 tablet (7.5 mg total) by mouth 2 (two) times daily. 60 tablet 0  . Multiple Vitamin (MULTIVITAMIN) tablet Take 1 tablet by mouth daily.    . QUEtiapine (SEROQUEL) 25 MG tablet Take 1 tablet (25 mg total) by mouth at bedtime. Must keep appt w/new provider for future refills 30 tablet 0   No current facility-administered medications on file prior to visit.     BP 124/82 (BP Location: Right Arm, Patient Position: Sitting, Cuff Size: Normal)   Pulse 60   Temp (!) 97.5 F (36.4 C) (Oral)   Resp 16   Ht 5\' 4"  (1.626 m)   SpO2 94%   BMI 19.57 kg/m       Objective:   Physical Exam  Constitutional: Vital signs are normal. She appears well-developed and well-nourished. No distress.  Pleasant but does not follow commands. Sitting in wheelchair.    HENT:  Head: Normocephalic and atraumatic.  Neck: Normal range of motion. Neck supple.  Cardiovascular: Normal rate, regular rhythm and intact distal pulses.  Pulmonary/Chest: Effort normal. No respiratory distress.  Musculoskeletal: She exhibits no edema, tenderness or deformity.  Neurological: She is alert. She has normal strength. GCS eye subscore is 4. GCS verbal subscore is 4. GCS motor subscore is 5.  Skin: Skin is warm and dry.  Psychiatric: She has a normal mood and affect. Her mood appears not anxious. She is not agitated and not aggressive. Cognition and memory are impaired. She does not exhibit a depressed mood.       Assessment & Plan:   RTC in 6 months for  routine follow up

## 2017-09-05 NOTE — Assessment & Plan Note (Addendum)
Managed at home by daughter and home health nurse. Mood stable with seroquel. Continue seroquel at current dosage. Medications ordered: - QUEtiapine (SEROQUEL) 25 MG tablet; Take 1 tablet (25 mg total) by mouth at bedtime.  Dispense: 90 tablet; Refill: 0

## 2017-09-05 NOTE — Assessment & Plan Note (Signed)
She has completed meloxicam prescription at home. Daughter is unsure if she needs to continue this medication, as she does not seem to be in pain. She will try OTC tylenol 650 TID and follow up if symptoms worsen.

## 2017-09-05 NOTE — Patient Instructions (Signed)
Please return for lab work when she is fasting-nothing to eat for 8 hours prior to lab work.  I will see you guys back in 6 months, or sooner if you need me.  It was nice to meet you. Thanks for letting me take care of you today :)

## 2017-09-29 ENCOUNTER — Ambulatory Visit (INDEPENDENT_AMBULATORY_CARE_PROVIDER_SITE_OTHER): Payer: Medicare Other | Admitting: Neurology

## 2017-09-29 ENCOUNTER — Encounter: Payer: Self-pay | Admitting: Neurology

## 2017-09-29 VITALS — BP 142/80 | HR 79 | Ht 65.0 in | Wt 113.0 lb

## 2017-09-29 DIAGNOSIS — R55 Syncope and collapse: Secondary | ICD-10-CM

## 2017-09-29 DIAGNOSIS — F03C0 Unspecified dementia, severe, without behavioral disturbance, psychotic disturbance, mood disturbance, and anxiety: Secondary | ICD-10-CM

## 2017-09-29 DIAGNOSIS — F0391 Unspecified dementia with behavioral disturbance: Secondary | ICD-10-CM | POA: Diagnosis not present

## 2017-09-29 DIAGNOSIS — F0390 Unspecified dementia without behavioral disturbance: Secondary | ICD-10-CM | POA: Diagnosis not present

## 2017-09-29 MED ORDER — QUETIAPINE FUMARATE 25 MG PO TABS: ORAL_TABLET | ORAL | 3 refills | Status: DC

## 2017-09-29 NOTE — Progress Notes (Signed)
NEUROLOGY CONSULTATION NOTE  Kendra Nixon MRN: [id number] DOB: Mar 30, 1941  Referring provider: Dr. Clare GandyJeremy Schmitz Primary care provider: Alphonse GuildAshleigh Shambley, NP  Reason for consult:  Seizure-like activity  Dear Dr Jordan LikesSchmitz:  Thank you for your kind referral of Kendra Mouldingunice Gambone for consultation of the above symptoms. Although her history is well known to you, please allow me to reiterate it for the purpose of our medical record. The patient was accompanied to the clinic by her daughter who also provides collateral information since the patient has significant dementia. Records and images were personally reviewed where available.  HISTORY OF PRESENT ILLNESS: This is a 77 year old woman with a history of hypertension, hyperlipidemia, prior strokes, advanced dementia, presenting for recurrent episodes of transient loss of consciousness. She is now total care and lives with her daughter. Records were reviewed. [facility]. Records indicate that prior to this admission, she was functional, independent, living alone. She was at her baseline level of functioning, and family visiting her that weekend noticed she seemed slightly disoriented, then became unable to complete sentences appropriately, had word-finding difficulties, and was incontinent of either bladder or bowel. She was confused, trying to put her shoes on then her socks over the shoes. She started becoming agitated and restless and was brought to the ER where BP was 187/90, HR 109, afebrile, bloodwork was normal, including TSH and B12. She had an MRI brain without contrast which showed an acute infarct in the left posterior thalamus, numerous chronic microhemorrhages, multiple old bilateral basal ganglia and thalamic lacunar infarcts, and an old left occipital infarct, as well as extensive chronic microvascular disease. MRA head showed mild probable stenosis of right M2, patent basilar artery, left vertebral artery  dominant, likely stenotic left superior cerebellar origin. No large vessel occlusion, high grade stenosis seen. Carotid dopplers showed 1-39% bilateral carotid stenosis, vertebral arteries appear patent. Echocardiogram showed EF of 60-65%, grade 1 diastolic dysfunction, normal left atrium. It was felt that the degree of encephalopathy did not correlate with the size of stroke, she had an EEG which showed diffuse slowing, no epileptiform discharges. She was discharged to rehab, then has been living with her daughter. She has a caregiver coming on a daily basis, she has close supervision at home except at night. Her daughter reports that cognitively she has not improved since December 2017, she continues to EchoStarramble nonsensically. She was initially more violent, but this had calmed down, until recently she has become more agitated again, throwing water at her granddaughter and cursing when upset. She has been taking Seroquel 25mg  qhs since December 2017.  Her daughter reports that a few months after hospital discharge, she started having episodes of suddenly slumping down and losing consciousness for 10-20 seconds, then she wakes up like nothing happened. Sometimes they occur 2-3 times a week, other times she would go 3 weeks without events. The last episode was Monday while being guided with her walker, she slumped down and caregiver held her so she would not fall. She then woke up soon after and started back walking. No jerking or shaking noted. She sometimes drools. Family notices she occasionally stares off, but she is always talking nonsensical speech, so it is difficult to determine increased confusion. She tries to get up and walk at night, there are phases when she is up at night. Sleep does not seem to be a trigger for the events.   At baseline, she needs helps with all ADLs. She  wears adult diapers. Family cute her food, she is able to feed herself and hold her coffee cup. Her daughter tried melatonin 5mg   for sleep, but it seemed to make matters worse. She occasionally give Tylenol with sleep aid, which seems to work.   PAST MEDICAL HISTORY: Past Medical History:  Diagnosis Date  . ACE-inhibitor cough   . Arthritis    "all over" (09/01/2016)  . CVA (cerebral vascular accident) (HCC)    Old left basal ganglion lacunar infarct noted per CT head (01/2013)  . CVA (cerebral vascular accident) (HCC) 08/31/2016   "verbal before; nonverbal now; weak all over now" (09/01/2016)  . Dementia    Hattie Perch 08/31/2016  . Hyperlipidemia LDL goal < 100   . Hypertension   . Solitary lung nodule    4 mm right upper lobe nodule  . UTI (urinary tract infection) 08/31/2016   Hattie Perch 08/31/2016    PAST SURGICAL HISTORY: Past Surgical History:  Procedure Laterality Date  . BREAST LUMPECTOMY Bilateral 1960   "benign"  . FEMUR IM NAIL Right 08/16/2014   Procedure: INTRAMEDULLARY (IM) NAIL FEMORAL;  Surgeon: Sheral Apley, MD;  Location: [facility] OR;  Service: Orthopedics;  Laterality: Right;  . FRACTURE SURGERY      MEDICATIONS: Current Outpatient Medications on File Prior to Visit  Medication Sig Dispense Refill  . amLODipine (NORVASC) 10 MG tablet Take 1 tablet (10 mg total) by mouth daily. 90 tablet 0  . aspirin 325 MG tablet Take 1 tablet (325 mg total) by mouth daily. 30 tablet 0  . atorvastatin (LIPITOR) 80 MG tablet Take 0.5 tablets (40 mg total) by mouth daily at 6 PM. 90 tablet 0  . losartan (COZAAR) 100 MG tablet Take 1 tablet (100 mg total) by mouth daily. 90 tablet 0  . Multiple Vitamin (MULTIVITAMIN) tablet Take 1 tablet by mouth daily.    . QUEtiapine (SEROQUEL) 25 MG tablet Take 1 tablet (25 mg total) by mouth at bedtime. 90 tablet 0   No current facility-administered medications on file prior to visit.     ALLERGIES: No Known Allergies  FAMILY HISTORY: Family History  Problem Relation Age of Onset  . Breast cancer Sister 9  . Hypertension Unknown   . Alzheimer's disease Maternal  Grandmother   . Alzheimer's disease Maternal Aunt   . Arthritis Mother   . Heart disease Mother   . Heart disease Father     SOCIAL HISTORY: Social History   Socioeconomic History  . Marital status: Widowed    Spouse name: Not on file  . Number of children: 3  . Years of education: 11th grade  . Highest education level: Not on file  Social Needs  . Financial resource strain: Not on file  . Food insecurity - worry: Not on file  . Food insecurity - inability: Not on file  . Transportation needs - medical: Not on file  . Transportation needs - non-medical: Not on file  Occupational History  . 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 Smoking status: Not on file  . Smokeless tobacco: Never Used  Substance and Sexual Activity  . Alcohol use: No  . Drug use: No  . Sexual activity: Not on file  Other Topics Concern  . Not on file  Social History Narrative   Lives in [place] alone in an independent living center. Is able to perform all ADLS and IADLs independently.  REVIEW OF SYSTEMS unable to obtain due to altered mental status  PHYSICAL EXAM: Vitals:   09/29/17 0901  BP: (!) 142/80  Pulse: 79  SpO2: 99%   General: No acute distress, sitting on wheelchair Head:  Normocephalic/atraumatic Eyes: Fundoscopic exam shows bilateral sharp discs, no vessel changes, exudates, or hemorrhages Neck: supple, no paraspinal tenderness, full range of motion Back: No paraspinal tenderness Heart: regular rate and rhythm Lungs: Clear to auscultation bilaterally. Vascular: No carotid bruits. Skin/Extremities: No rash, no edema Neurological Exam: Mental status: alert, looks at examiner and would repeat a word from the question, but does not answer question and speaks nonsensically. Does not follow commands.  Cranial nerves: CN I: not tested CN II: pupils equal, round and reactive to light, visual fields intact, fundi  unremarkable. CN III, IV, VI:  full range of motion, no nystagmus, no ptosis CN V: unable to test CN VII: upper and lower face symmetric CN VIII: hearing intact to voice CN IX, X: gag intact, uvula midline CN XI: sternocleidomastoid and trapezius muscles intact CN XII: tongue midline Bulk & Tone: normal, no fasciculations. Motor: moves all extremities symmetrically Sensation: withdraws to touch symmetrically Deep Tendon Reflexes: +2 throughout, no ankle clonus Plantar responses: downgoing bilaterally Cerebellar: no incoordination when reaching for objects Gait: not tested Tremor: none  IMPRESSION: This is a 77 year old woman with a history of hypertension, hyperlipidemia, admitted for altered mental status with nonsensical speech in December 2017. Her MRI brain showed a small acute left thalamic infarct, which would not fully explain her cognitive status. 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 She has not recovered since then, and is total care. A few months after hospital admission, she started having brief 10-20 seconds episodes of slumping down, no convulsive activity, then wakes up as if nothing happened. These have been called seizures, and although dementia does increase risk for seizures, I would still recommend a cardiac evaluation and prolonged EEG before considering staring anti-epileptic medication. A 1-hour EEG will be ordered. She would not tolerate a 30-day holter, and will be referred to Cardiology for consideration for loop recorder. She was started on Seroquel during the hospitalization, a repeat EKG will be ordered to check QTc, and if fine, increase Seroquel to 25mg  1.5 tabs qhs. She will follow-up after the tests.  Thank you for allowing me to participate in the care of this patient. Please do not hesitate to call for any questions or concerns.   Patrcia Dolly, M.D.  CC: Dr. Jordan Likes, Alphonse Guild, NP

## 2017-09-29 NOTE — Patient Instructions (Signed)
1. Schedule 1-hour EEG 2. Schedule EKG through your PCP 3. Refer to Cardiology for syncope and consideration for loop recorder 4. Increase Seroquel 25mg : take 1.5 tablets at bedtime 5. Follow-up after tests

## 2017-10-03 ENCOUNTER — Other Ambulatory Visit: Payer: Medicare Other

## 2017-10-05 ENCOUNTER — Other Ambulatory Visit: Payer: Medicare Other

## 2017-10-10 ENCOUNTER — Ambulatory Visit (INDEPENDENT_AMBULATORY_CARE_PROVIDER_SITE_OTHER): Payer: Medicare Other | Admitting: Neurology

## 2017-10-10 DIAGNOSIS — F0391 Unspecified dementia with behavioral disturbance: Secondary | ICD-10-CM

## 2017-10-10 DIAGNOSIS — F0390 Unspecified dementia without behavioral disturbance: Secondary | ICD-10-CM

## 2017-10-10 DIAGNOSIS — F03C0 Unspecified dementia, severe, without behavioral disturbance, psychotic disturbance, mood disturbance, and anxiety: Secondary | ICD-10-CM

## 2017-10-10 DIAGNOSIS — R55 Syncope and collapse: Secondary | ICD-10-CM

## 2017-10-14 ENCOUNTER — Telehealth: Payer: Self-pay | Admitting: Neurology

## 2017-10-14 MED ORDER — DIVALPROEX SODIUM ER 250 MG PO TB24: ORAL_TABLET | ORAL | 6 refills | Status: DC

## 2017-10-14 NOTE — Telephone Encounter (Signed)
Discussed EEG results with daughter, showing left posterior temporal epileptiform discharges. Recommend starting Depakote ER 250mg  qhs, discussed side effects. Monitor seizures on low dose, if seizures continue, we will increase dose. Daughter knows to call for any issues.

## 2017-10-14 NOTE — Procedures (Signed)
ELECTROENCEPHALOGRAM REPORT  Date of Study: 10/10/2017  Patient's Name: Kendra Nixon MRN: [id number] Date of Birth: 1941/04/01  Referring Provider: Dr. Patrcia DollyKaren Mazen Marcin  Clinical History: This is a 77 year old woman with advanced dementia and recurrent transient episodes of loss of consciousness. EEG for classification.  Medications: NORVASC 10 MG tablet  aspirin 325 MG tablet  LIPITOR 80 MG tablet COZAAR 100 MG tablet  MULTIVITAMIN tablet   SEROQUEL 25 MG tablet   Technical Summary: A multichannel digital 1-hour EEG recording measured by the international 10-20 system with electrodes applied with paste and impedances below 5000 ohms performed as portable with EKG monitoring in an awake and drowsy patient.  Hyperventilation was not performed. Photic stimulation was performed.  The digital EEG was referentially recorded, reformatted, and digitally filtered in a variety of bipolar and referential montages for optimal display.   Description: The patient is awake and drowsy during the recording. There is no clear posterior dominant rhythm seen. The background consists of a small amount of diffuse 4-6 Hz theta slowing, with additional occasional focal theta slowing over the left temporal region. During drowsiness, there is an increase in theta slowing of the background. Deeper stages of sleep were not captured. Photic stimulation did not elicit any abnormalities.  There were frequent left posterior temporal sharp waves seen, more frequently noted during drowsy state. No electrographic seizures seen.    EKG lead was unremarkable.  Impression: This awake and drowsy EEG is abnormal due to the presence of: 1. Mild diffuse slowing of the waking background 2. Additional occasional focal slowing over the left temporal region 3. Frequent left posterior temporal epileptiform discharges  Clinical Correlation of the above findings indicates diffuse cerebral dysfunction that is non-specific in  etiology and can be seen with hypoxic/ischemic injury, toxic/metabolic encephalopathies, neurodegenerative disorders, or medication effect. Focal slowing over the left temporal region indicates focal cerebral dysfunction in this region suggestive of underlying structural or physiologic abnormality. There is a possible tendency for seizures to arise from the left posterior temporal region. Clinical correlation is advised.   Patrcia DollyKaren Amanat Hackel, M.D.

## 2017-10-20 NOTE — Telephone Encounter (Signed)
Spoke with pt's daughter, Kendra Nixon.  Appointment made for Thursday, May 16 at 11:30

## 2017-12-30 ENCOUNTER — Other Ambulatory Visit: Payer: Self-pay | Admitting: Nurse Practitioner

## 2017-12-30 DIAGNOSIS — I10 Essential (primary) hypertension: Secondary | ICD-10-CM

## 2017-12-30 NOTE — Telephone Encounter (Signed)
Patient checking status, patient is completely out. Call back [phone]

## 2018-01-07 ENCOUNTER — Other Ambulatory Visit: Payer: Self-pay | Admitting: Nurse Practitioner

## 2018-01-07 DIAGNOSIS — I10 Essential (primary) hypertension: Secondary | ICD-10-CM

## 2018-01-19 ENCOUNTER — Ambulatory Visit: Payer: Medicare Other | Admitting: Neurology

## 2018-03-16 ENCOUNTER — Encounter: Payer: Self-pay | Admitting: Nurse Practitioner

## 2018-03-16 ENCOUNTER — Other Ambulatory Visit (INDEPENDENT_AMBULATORY_CARE_PROVIDER_SITE_OTHER): Payer: Medicare Other

## 2018-03-16 ENCOUNTER — Ambulatory Visit (INDEPENDENT_AMBULATORY_CARE_PROVIDER_SITE_OTHER): Payer: Medicare Other | Admitting: Nurse Practitioner

## 2018-03-16 VITALS — BP 136/80 | HR 66 | Resp 16 | Ht 65.0 in | Wt 122.0 lb

## 2018-03-16 DIAGNOSIS — E782 Mixed hyperlipidemia: Secondary | ICD-10-CM

## 2018-03-16 DIAGNOSIS — M7989 Other specified soft tissue disorders: Secondary | ICD-10-CM

## 2018-03-16 DIAGNOSIS — I10 Essential (primary) hypertension: Secondary | ICD-10-CM

## 2018-03-16 LAB — COMPREHENSIVE METABOLIC PANEL
ALBUMIN: 3.9 g/dL (ref 3.5–5.2)
ALK PHOS: 95 U/L (ref 39–117)
ALT: 9 U/L (ref 0–35)
AST: 21 U/L (ref 0–37)
BILIRUBIN TOTAL: 0.5 mg/dL (ref 0.2–1.2)
BUN: 12 mg/dL (ref 6–23)
CO2: 30 mEq/L (ref 19–32)
Calcium: 9.8 mg/dL (ref 8.4–10.5)
Chloride: 100 mEq/L (ref 96–112)
Creatinine, Ser: 0.84 mg/dL (ref 0.40–1.20)
GFR: 84.05 mL/min (ref >60.00)
GLUCOSE: 94 mg/dL (ref 70–99)
POTASSIUM: 4.4 meq/L (ref 3.5–5.1)
SODIUM: 137 meq/L (ref 135–145)
TOTAL PROTEIN: 7.8 g/dL (ref 6.0–8.3)

## 2018-03-16 LAB — CBC
HCT: 38 % (ref 36.0–46.0)
Hemoglobin: 12.6 g/dL (ref 12.0–15.0)
MCHC: 33.2 g/dL (ref 30.0–36.0)
MCV: 85.7 fl (ref 78.0–100.0)
Platelets: 199 10*3/uL (ref 150.0–400.0)
RBC: 4.43 Mil/uL (ref 3.87–5.11)
RDW: 14.9 % (ref 11.5–15.5)
WBC: 5 10*3/uL (ref 4.0–10.5)

## 2018-03-16 LAB — BRAIN NATRIURETIC PEPTIDE: PRO B NATRI PEPTIDE: 90 pg/mL (ref 0.0–100.0)

## 2018-03-16 LAB — LIPID PANEL
CHOLESTEROL: 174 mg/dL (ref 0–200)
HDL: 56.4 mg/dL (ref >39.00)
LDL Cholesterol: 106 mg/dL — ABNORMAL HIGH (ref 0–99)
NONHDL: 117.97
Total CHOL/HDL Ratio: 3
Triglycerides: 62 mg/dL (ref 0.0–149.0)
VLDL: 12.4 mg/dL (ref 0.0–40.0)

## 2018-03-16 NOTE — Patient Instructions (Addendum)
Please head downstairs for lab work. If any of your test results are critically abnormal, you will be contacted right away. Otherwise, I will contact you within a week about your test results and any recommendations for abnormalities.  I have placed an order for an ultrasound of your left leg. Our office will begin processing this referral. Please follow up if you have not heard anything about this referral within 10 days.  We will determine what to do next when I get your test results back.

## 2018-03-16 NOTE — Assessment & Plan Note (Signed)
Continue atorvastatin Update labs, unsure if she is fasting F/U with further recommendations pending lab results - CBC; Future - Comprehensive metabolic panel; Future - Lipid panel; Future

## 2018-03-16 NOTE — Progress Notes (Signed)
Name: Kendra Nixon   MRN: [id number]    DOB: 10-04-1940   Date:03/16/2018       Progress Note  Subjective  Chief Complaint  Chief Complaint  Patient presents with  . Follow-up    left foot swelling     HPI Kendra Nixon is here today for routine follow up of her chronic medical conditions as well as a complaint of left lower extremity swelling. She is accompanied by her daughter who is her primary caregiver and provides history today. She has a history of advanced dementia, and is mostly nonverbal.  Hypertension -maintained on losartan 100, amlodipine 10 daily Reports daily medication compliance without noted adverse medication effects. Reports  Regularly checking blood pressure at home with recent readings 120s/80s.  BP Readings from Last 3 Encounters:  03/16/18 136/80  09/29/17 (!) 142/80  09/05/17 124/82   Cholesterol- maintained on atorvastatin 40 daily Reports daily medication compliance without noted adverse medication effects.  Lab Results  Component Value Date   CHOL 194 03/04/2017   HDL 66.50 03/04/2017   LDLCALC 117 (H) 03/04/2017   TRIG 54.0 03/04/2017   CHOLHDL 3 03/04/2017   Left ankle swelling- This is not a new problem She was first evaluated for left foot swelling by provider here in November 2018, treated with course of keflex and the swelling seemed to improve for a few months but has slowly returned The swelling is worse at the end of the day and improves with elevation. The family has not noticed any fevers, shortness of breath, erythema, bruising or skin discoloration. No right lower extremity swelling. She is mostly sedentary, sits in chair or wheelchair all day.   Patient Active Problem List   Diagnosis Date Noted  . Seizure-like activity (HCC) 01/03/2017  . 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 Acute delirium 09/02/2016  . Dementia 08/31/2016  . CVA (cerebral vascular accident) (HCC) 08/31/2016  . Osteoarthritis 12/18/2015  . Routine  general medical examination at a health care facility 01/29/2015  . Medicare annual wellness visit, subsequent 01/29/2015  . Fracture, intertrochanteric, right femur (HCC) 08/15/2014  . Hyperlipidemia 08/15/2014  . Essential hypertension 08/15/2014  . Tobacco use 08/15/2014  . Femur fracture (HCC)   . PAC (premature atrial contraction) 01/30/2013  . Syncope 01/29/2013  . Sinus bradycardia 01/29/2013  . First degree heart block 01/29/2013  . Pulmonary nodule 11/04/2012    Past Surgical History:  Procedure Laterality Date  . BREAST LUMPECTOMY Bilateral 1960   "benign"  . FEMUR IM NAIL Right 08/16/2014   Procedure: INTRAMEDULLARY (IM) NAIL FEMORAL;  Surgeon: Sheral Apley, MD;  Location: [facility] OR;  Service: Orthopedics;  Laterality: Right;  . FRACTURE SURGERY      Family History  Problem Relation Age of Onset  . Breast cancer Sister 76  . Hypertension Unknown   . Alzheimer's disease Maternal Grandmother   . Alzheimer's disease Maternal Aunt   . Arthritis Mother   . Heart disease Mother   . Heart disease Father     Social History   Socioeconomic History  . Marital status: Widowed    Spouse name: Not on file  . Number of children: 3  . Years of education: 11th grade  . Highest education level: Not on file  Occupational History  . 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 Financial resource strain: Not on file  . Food insecurity:    Worry: Not on file  Inability: Not on file  . Transportation needs:    Medical: Not on file    Non-medical: Not on file  Tobacco Use  . Smoking status: Former Smoker    Packs/day: 0.10    Years: 30.00    Pack years: 3.00    Types: Cigarettes  . Smokeless tobacco: Never Used  Substance and Sexual Activity  . Alcohol use: No  . Drug use: No  . Sexual activity: Not on file  Lifestyle  . Physical activity:    Days per week: Not on file    Minutes per session: Not on  file  . Stress: Not on file  Relationships  . Social connections:    Talks on phone: Not on file    Gets together: Not on file    Attends religious service: Not on file    Active member of club or organization: Not on file    Attends meetings of clubs or organizations: Not on file    Relationship status: Not on file  . Intimate partner violence:    Fear of current or ex partner: Not on file    Emotionally abused: Not on file    Physically abused: Not on file    Forced sexual activity: Not on file  Other Topics Concern  . 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  amLODipine (NORVASC) 10 MG tablet, TAKE 1 TABLET BY MOUTH ONCE DAILY, Disp: 90 tablet, Rfl: 0 .  aspirin 325 MG tablet, Take 1 tablet (325 mg total) by mouth daily., Disp: 30 tablet, Rfl: 0 .  atorvastatin (LIPITOR) 80 MG tablet, Take 0.5 tablets (40 mg total) by mouth daily at 6 PM., Disp: 90 tablet, Rfl: 0 .  divalproex (DEPAKOTE ER) 250 MG 24 hr tablet, Take 1 tablet every evening, Disp: 30 tablet, Rfl: 6 .  losartan (COZAAR) 100 MG tablet, TAKE 1 TABLET BY MOUTH ONCE DAILY, Disp: 90 tablet, Rfl: 0 .  Multiple Vitamin (MULTIVITAMIN) tablet, Take 1 tablet by mouth daily., Disp: , Rfl:  .  QUEtiapine (SEROQUEL) 25 MG tablet, Take 1.5 tablets at night, Disp: 135 tablet, Rfl: 3  No Known Allergies   ROS See HPI  Objective  Vitals:   03/16/18 1303  BP: 136/80  Pulse: 66  Resp: 16  SpO2: 92%  Weight: 122 lb (55.3 kg)  Height: 5\' 5"  (1.651 m)    Body mass index is 20.3 kg/m.  Physical Exam Vital signs reviewed. Constitutional: Patient appears well-developed and well-nourished. No distress.  HENT: Head: Normocephalic and atraumatic. Nose: Nose normal. Mouth/Throat: Oropharynx is clear and moist. No  oropharyngeal exudate.  Eyes: Conjunctivae and EOM are normal. Pupils are equal, round, and reactive to light. No scleral icterus.  Neck: Normal range of motion. Neck supple.  Cardiovascular: Normal rate, regular rhythm and normal heart sounds.  Distal pulses intact. 1+ edema to left ankle and left foot.  Pulmonary/Chest: Effort normal, No respiratory distress. Musculoskeletal: No gross deformities. Neurological: She is alert, nonverbal. She does not follow commands. In wheelchair. Skin: Skin is warm and dry. No rash noted. No erythema.  Psychiatric: Patient has a normal mood and affect. behavior is normal. Judgment and thought content normal.    Fall Risk: Fall Risk  09/29/2017  07/07/2017 01/29/2015  Falls in the past year? No No No    Assessment & Plan F/U TBD pending lab results Labs were never completed after last OV on 09/05/17 but daughter states they will go for labs today  -Reviewed Health Maintenance: declines TDAP, PNA vaccinations today  Foot swelling 07/07/17 Left foot xray showed old, healed fifth metatarsal fracture Additional testing ordered today F/U with further recommendations pending lab results - VAS US LOWER EXTREMITY VENOUS (DVT); Future - B Nat Peptide; Future

## 2018-03-16 NOTE — Assessment & Plan Note (Signed)
Stable Continue current medications Continue to monitor - CBC; Future - Comprehensive metabolic panel; Future - Lipid panel; Future

## 2018-03-17 ENCOUNTER — Encounter: Payer: Self-pay | Admitting: Neurology

## 2018-03-17 ENCOUNTER — Ambulatory Visit (INDEPENDENT_AMBULATORY_CARE_PROVIDER_SITE_OTHER): Payer: Medicare Other | Admitting: Neurology

## 2018-03-17 ENCOUNTER — Other Ambulatory Visit: Payer: Self-pay

## 2018-03-17 VITALS — BP 94/58 | HR 69 | Ht 65.0 in | Wt 122.0 lb

## 2018-03-17 DIAGNOSIS — F0391 Unspecified dementia with behavioral disturbance: Secondary | ICD-10-CM | POA: Diagnosis not present

## 2018-03-17 DIAGNOSIS — G40209 Localization-related (focal) (partial) symptomatic epilepsy and epileptic syndromes with complex partial seizures, not intractable, without status epilepticus: Secondary | ICD-10-CM

## 2018-03-17 MED ORDER — DIVALPROEX SODIUM ER 250 MG PO TB24: ORAL_TABLET | ORAL | 3 refills | Status: DC

## 2018-03-17 MED ORDER — QUETIAPINE FUMARATE 25 MG PO TABS: ORAL_TABLET | ORAL | 3 refills | Status: AC

## 2018-03-17 NOTE — Progress Notes (Signed)
NEUROLOGY FOLLOW UP OFFICE NOTE  Kendra Nixon [id number] 03-13-1941  HISTORY OF PRESENT ILLNESS: I had the pleasure of seeing Kendra Nixon in follow-up in the neurology clinic on 03/17/2018.  The patient was last seen 6 months ago for recurrent episodes of transient loss of consciousness. She is again accompanied by her daughter who provides the history today as she has severe dementia and is largely non-verbal in the office. Records and images were personally reviewed where available. Her 1-hour EEG down 10/10/17 was abnormal showing diffuse slowing, left temporal slowing, and frequent left posterior temporal epileptiform discharges. She was started on low dose Depakote ER 250mg  qhs. Her daughter reports a decrease in episodes of loss of consciousness, she can go 3-4 weeks without an episode, then would have one every week. The last episode was 2 weeks ago where she slumped down with some drooling. She has severe dementia and needs total care. Her daughter reports more behavioral changes, she curses a lot and tries to hit her daughter and her caregivers. Sleep and appetite are good. Her daughter notes a difference when the patient's son from the military is home, she would have a full conversation with him and be very nice to him, but then angry at her other family. They walk her daily with her walker, but this morning she stood up by herself and her daughter found her at the doorway without her walker. Otherwise she does not wander around. She is on Seroquel 25mg  1.5 tabs qhs with no side effects.   HPI 09/29/2017: This is a 77 yo woman with a history of hypertension, hyperlipidemia, prior strokes, advanced dementia,who presented for evaluation of recurrent episodes of transient loss of consciousness. She is now total care and lives with her daughter. Records were reviewed. She was admitted to [facility] in December 2017. Records indicate that prior to this admission, she was functional, independent, living  alone. She was at her baseline level of functioning, and family visiting her that weekend noticed she seemed slightly disoriented, then became unable to complete sentences appropriately, had word-finding difficulties, and was incontinent of either bladder or bowel. She was confused, trying to put her shoes on then her socks over the shoes. She started becoming agitated and restless and was brought to the ER where BP was 187/90, HR 109, afebrile, bloodwork was normal, including TSH and B12. She had an MRI brain without contrast which showed an acute infarct in the left posterior thalamus, numerous chronic microhemorrhages, multiple old bilateral basal ganglia and thalamic lacunar infarcts, and an old left occipital infarct, as well as extensive chronic microvascular disease. MRA head showed mild probable stenosis of right M2, patent basilar artery, left vertebral artery dominant, likely stenotic left superior cerebellar origin. No large vessel occlusion, high grade stenosis seen. Carotid dopplers showed 1-39% bilateral carotid stenosis, vertebral arteries appear patent. Echocardiogram showed EF of 60-65%, grade 1 diastolic dysfunction, normal left atrium. It was felt that the degree of encephalopathy did not correlate with the size of stroke, she had an EEG which showed diffuse slowing, no epileptiform discharges. She was discharged to rehab, then has been living with her daughter. She has a caregiver coming on a daily basis, she has close supervision at home except at night. Her daughter reports that cognitively she has not improved since December 2017, she continues to [employer]. She was initially more violent, but this had calmed down, until recently she has become more agitated again, throwing water at her granddaughter and cursing when  upset. She has been taking Seroquel 25mg  qhs since December 2017.  Her daughter reports that a few months after hospital discharge, she started having episodes of  suddenly slumping down and losing consciousness for 10-20 seconds, then she wakes up like nothing happened. Sometimes they occur 2-3 times a week, other times she would go 3 weeks without events. The last episode was Monday while being guided with her walker, she slumped down and caregiver held her so she would not fall. She then woke up soon after and started back walking. No jerking or shaking noted. She sometimes drools. Family notices she occasionally stares off, but she is always talking nonsensical speech, so it is difficult to determine increased confusion. She tries to get up and walk at night, there are phases when she is up at night. Sleep does not seem to be a trigger for the events.   At baseline, she needs helps with all ADLs. She wears adult diapers. Family cute her food, she is able to feed herself and hold her coffee cup. Her daughter tried melatonin 5mg  for sleep, but it seemed to make matters worse. She occasionally give Tylenol with sleep aid, which seems to work.   PAST MEDICAL HISTORY: Past Medical History:  Diagnosis Date  . ACE-inhibitor cough   . Arthritis    "all over" (09/01/2016)  . CVA (cerebral vascular accident) (HCC)    Old left basal ganglion lacunar infarct noted per CT head (01/2013)  . CVA (cerebral vascular accident) (HCC) 08/31/2016   "verbal before; nonverbal now; weak all over now" (09/01/2016)  . Dementia    Hattie Perch 08/31/2016  . Hyperlipidemia LDL goal < 100   . Hypertension   . Solitary lung nodule    4 mm right upper lobe nodule  . UTI (urinary tract infection) 08/31/2016   Hattie Perch 08/31/2016    MEDICATIONS: Current Outpatient Medications on File Prior to Visit  Medication Sig Dispense Refill  . amLODipine (NORVASC) 10 MG tablet TAKE 1 TABLET BY MOUTH ONCE DAILY 90 tablet 0  . aspirin 325 MG tablet Take 1 tablet (325 mg total) by mouth daily. 30 tablet 0  . atorvastatin (LIPITOR) 80 MG tablet Take 0.5 tablets (40 mg total) by mouth daily at 6  PM. 90 tablet 0  . divalproex (DEPAKOTE ER) 250 MG 24 hr tablet Take 1 tablet every evening 30 tablet 6  . losartan (COZAAR) 100 MG tablet TAKE 1 TABLET BY MOUTH ONCE DAILY 90 tablet 0  . Multiple Vitamin (MULTIVITAMIN) tablet Take 1 tablet by mouth daily.    . QUEtiapine (SEROQUEL) 25 MG tablet Take 1.5 tablets at night 135 tablet 3   No current facility-administered medications on file prior to visit.     ALLERGIES: No Known Allergies  FAMILY HISTORY: Family History  Problem Relation Age of Onset  . Breast cancer Sister 51  . Hypertension Unknown   . Alzheimer's disease Maternal Grandmother   . Alzheimer's disease Maternal Aunt   . Arthritis Mother   . Heart disease Mother   . Heart disease Father     SOCIAL HISTORY: Social History   Socioeconomic History  . Marital status: Widowed    Spouse name: Not on file  . Number of children: 3  . Years of education: 11th grade  . Highest education level: Not on file  Occupational History  . 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 Financial resource strain: Not  on file  . Food insecurity:    Worry: Not on file    Inability: Not on file  . Transportation needs:    Medical: Not on file    Non-medical: Not on file  Tobacco Use  . Smoking status: Former Smoker    Packs/day: 0.10    Years: 30.00    Pack years: 3.00    Types: Cigarettes  . Smokeless tobacco: Never Used  Substance and Sexual Activity  . Alcohol use: No  . Drug use: No  . Sexual activity: Not on file  Lifestyle  . Physical activity:    Days per week: Not on file    Minutes per session: Not on file  . Stress: Not on file  Relationships  . Social connections:    Talks on phone: Not on file    Gets together: Not on file    Attends religious service: Not on file    Active member of club or organization: Not on file    Attends meetings of clubs or organizations: Not on file    Relationship  status: Not on file  . Intimate partner violence:    Fear of current or ex partner: Not on file    Emotionally abused: Not on file    Physically abused: Not on file    Forced sexual activity: Not on file  Other Topics Concern  . 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     REVIEW OF SYSTEMS: Constitutional: No fevers, chills, or sweats, no generalized fatigue, change in appetite Eyes: No visual changes, double vision, eye pain Ear, nose and throat: No hearing loss, ear pain, nasal congestion, sore throat Cardiovascular: No chest pain, palpitations Respiratory:  No shortness of breath at rest or with exertion, wheezes GastrointestinaI: No nausea, vomiting, diarrhea, abdominal pain, fecal incontinence Genitourinary:  No dysuria, urinary retention or frequency Musculoskeletal:  No neck pain, back pain Integumentary: No rash, pruritus, skin lesions Neurological: as above Psychiatric: No depression, insomnia, anxiety Endocrine: No palpitations, fatigue, diaphoresis, mood swings, change in appetite, change in weight, increased thirst Hematologic/Lymphatic:  No anemia, purpura, petechiae. Allergic/Immunologic: no itchy/runny eyes, nasal congestion, recent allergic reactions, rashes  PHYSICAL EXAM: Vitals:   03/17/18 0830  BP: (!) 94/58  Pulse: 69  SpO2: 98%   General: No acute distress, sitting on wheelchair Head:  Normocephalic/atraumatic Neck: supple, no paraspinal tenderness, full range of motion Heart:  Regular rate and rhythm Lungs:  Clear to auscultation bilaterally Back: No paraspinal tenderness Skin/Extremities: No rash, no edema Neurological Exam: awake, does not follow commands, minimal verbal output, mostly mumbling when asked questions. Unable to complete MMSE. Cranial nerves: Pupils equal, round,  reactive to light. Unable to do EOM testing. +blink to threat bilaterally. Motor: unable to do formal muscle testing, keeps both arms lifted when raised by examiner, does not move legs. Gait not tested.  IMPRESSION: This is a 77 yo woman with a history of hypertension, hyperlipidemia, admitted for altered mental status with nonsensical speech in December 2017. Her MRI brain showed a small acute left thalamic infarct, which would not fully explain her cognitive status. 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 She has not recovered since then, and has been total care. A few months after hospital admission,  she started having brief 10-20 seconds episodes of slumping down, no convulsive activity, then wakes up as if nothing happened. Her 1-hour EEG showed frequent left posterior temporal epileptiform discharges. She is on low dose Depakote ER 250mg  qhs, daughter reports a decrease in spells but continues to have an event every few weeks. Increase Depakote ER to 500mg  qhs. This may help with behavioral changes as well. Increase Seroquel to 25mg  1/2 tab in AM, 1.5 tabs in PM. She will follow-up in 6 months, daughter will update [place] in 2 months and we may further uptitrate Depakote depending on response.   Thank you for allowing me to participate in her care.  Please do not hesitate to call for any questions or concerns.  The duration of this appointment visit was 26 minutes of face-to-face time with the patient.  Greater than 50% of this time was spent in counseling, explanation of diagnosis, planning of further management, and coordination of care.   Patrcia Dolly, M.D.   CC: Alphonse Guild, NP

## 2018-03-17 NOTE — Patient Instructions (Signed)
1. Increase Depakote ER 250mg : Take 2 tablets every night  2. Increase Seroquel 25mg : Take 1/2 tablet in AM, 1.5 tablets in PM  3. Call our office for an update on seizures in 2 months, we may increase dose of Depakote  4. Follow-up in 6 months, call for any changes  Seizure Precautions: 1. If medication has been prescribed for you to prevent seizures, take it exactly as directed.  Do not stop taking the medicine without talking to your doctor first, even if you have not had a seizure in a long time.   2. Avoid activities in which a seizure would cause danger to yourself or to others.  Don't operate dangerous machinery, swim alone, or climb in high or dangerous places, such as on ladders, roofs, or girders.  Do not drive unless your doctor says you may.  3. If you have any warning that you may have a seizure, lay down in a safe place where you can't hurt yourself.    4.  No driving for 6 months from last seizure, as [place].   Please refer to the following link on the Epile[employer] Foundation of America's website for more information: http://www.epilepsyfoundation.org/answerplace/Social/driving/drivingu.cfm   5.  Maintain good sleep hygiene. Avoid alcohol.  6.  Contact your doctor if you have any problems that may be related to the medicine you are taking.  7.  Call 911 and bring the patient back to the ED if:        A.  The seizure lasts longer than 5 minutes.       B.  The patient doesn't awaken shortly after the seizure  C.  The patient has new problems such as difficulty seeing, speaking or moving  D.  The patient was injured during the seizure  E.  The patient has a temperature over 102 F (39C)  F.  The patient vomited and now is having trouble breathing

## 2018-03-21 ENCOUNTER — Ambulatory Visit (HOSPITAL_COMMUNITY)
Admission: RE | Admit: 2018-03-21 | Discharge: 2018-03-21 | Disposition: A | Discharge status: Home / Self Care | Payer: Medicare Other | Source: Ambulatory Visit | Attending: Cardiovascular Disease | Admitting: Cardiovascular Disease

## 2018-03-21 DIAGNOSIS — M7989 Other specified soft tissue disorders: Secondary | ICD-10-CM | POA: Diagnosis present

## 2018-04-05 ENCOUNTER — Other Ambulatory Visit: Payer: Self-pay | Admitting: Nurse Practitioner

## 2018-04-05 DIAGNOSIS — I10 Essential (primary) hypertension: Secondary | ICD-10-CM

## 2018-04-10 ENCOUNTER — Other Ambulatory Visit: Payer: Self-pay | Admitting: Nurse Practitioner

## 2018-04-10 DIAGNOSIS — I10 Essential (primary) hypertension: Secondary | ICD-10-CM

## 2018-04-10 DIAGNOSIS — E782 Mixed hyperlipidemia: Secondary | ICD-10-CM

## 2018-06-12 ENCOUNTER — Telehealth: Payer: Self-pay | Admitting: *Deleted

## 2018-06-12 DIAGNOSIS — R2689 Other abnormalities of gait and mobility: Secondary | ICD-10-CM

## 2018-06-12 NOTE — Telephone Encounter (Signed)
Copied from CRM [id number]. Topic: Quick Communication - See Telephone Encounter >> Jun 12, 2018  3:31 PM Waymon Amato wrote: Pt is getting harder to move around and she is needing something to help get her up and down   Best number [phone] >> Jun 12, 2018  3:45 PM Claris Pong wrote: Is appt needed

## 2018-06-13 NOTE — Telephone Encounter (Signed)
Kendra Nixon (on [place]) calling to check the status of getting a gait belt for her mother. Would like a call with an update on whether an appointment is needed or if the belt could be sent. Please advise.

## 2018-06-14 NOTE — Telephone Encounter (Signed)
Patient's daughter, [name] informed of below. She was thankful for the f/u call.

## 2018-06-14 NOTE — Telephone Encounter (Signed)
Will you let her know we are working on this, I just want to make sure that gait belt is the best option to move her with, will follow up with her

## 2018-06-16 NOTE — Telephone Encounter (Signed)
Patient's daughter informed. Printed order mailed to patient's home address.

## 2018-06-16 NOTE — Telephone Encounter (Signed)
I have printed the prescription for gait belt for her, I have not been able to find any other options to help her move her mom at this time. Does she still have home health? They may have some other options for mobility. If she does not still have home health I can send a new referral also

## 2018-07-09 ENCOUNTER — Other Ambulatory Visit: Payer: Self-pay | Admitting: Nurse Practitioner

## 2018-07-09 DIAGNOSIS — I10 Essential (primary) hypertension: Secondary | ICD-10-CM

## 2018-07-25 ENCOUNTER — Other Ambulatory Visit: Payer: Self-pay

## 2018-07-25 DIAGNOSIS — F0391 Unspecified dementia with behavioral disturbance: Secondary | ICD-10-CM

## 2018-07-25 DIAGNOSIS — G40209 Localization-related (focal) (partial) symptomatic epilepsy and epileptic syndromes with complex partial seizures, not intractable, without status epilepticus: Secondary | ICD-10-CM

## 2018-07-25 MED ORDER — DIVALPROEX SODIUM ER 250 MG PO TB24: ORAL_TABLET | ORAL | 3 refills | Status: DC

## 2018-08-02 ENCOUNTER — Telehealth: Payer: Self-pay | Admitting: *Deleted

## 2018-08-02 ENCOUNTER — Ambulatory Visit: Payer: Self-pay | Admitting: *Deleted

## 2018-08-02 DIAGNOSIS — I63312 Cerebral infarction due to thrombosis of left middle cerebral artery: Secondary | ICD-10-CM

## 2018-08-02 DIAGNOSIS — F0391 Unspecified dementia with behavioral disturbance: Secondary | ICD-10-CM

## 2018-08-02 NOTE — Telephone Encounter (Signed)
Pt's daughter informed order is upfront for p/u. She states they will get it at 08/07/18 OV with PCP.

## 2018-08-02 NOTE — Telephone Encounter (Signed)
Clarisa SchoolsHey Kendra Nixon, Will you call the family and check in with them, just make sure it sounds like its okay to wait until Monday.

## 2018-08-02 NOTE — Telephone Encounter (Signed)
NOTED

## 2018-08-02 NOTE — Telephone Encounter (Signed)
Copied from CRM [id number]. Topic: General - Inquiry >> Aug 02, 2018  8:36 AM Windy KalataMichael, Taylor L, NT wrote: Reason for CRM: patients daughter is calling and states the patient is not able to use her walker like she once was and is using the wheel chair a lot now. She is looking for information on how she goes about getting a hospital bed for patient. Please advise.

## 2018-08-02 NOTE — Telephone Encounter (Signed)
Daughter called with her mom having more episodes of slumping over her walker when up. She has a hx of dementia. Daughter wants to know if this more advanced dementia, having a seizure or stroke. She feels like she is getting weaker. She has increased weakness over the last 2 weeks. Can hold her coffee cup but needs help drinking the coffee for the last 2 days.  She also has a dx of seizure activity and is on medication.  The patient is not experiencing any facial dropping, no increased weakness on one side of the body or other and no slurred speech.  Advised that these are signs she need to watch for. Daughter voiced understanding. Appointment scheduled to speak with the provider regarding her mom's weakness for Monday per daughter request. Will call back if symptoms increase.  Reason for Disposition . New or worsening falling  Answer Assessment - Initial Assessment Questions 1. MAIN CONCERN OR SYMPTOM:  "What is your main concern right now?" "What questions do you have?" "What's the main symptom you're worried about?" (e.g., confusion, memory loss)     Slumping over walker at times and wakes back up 2. ONSET:  "When did the symptom start (or worsen)?" (minutes, hours, days, weeks)     Last 2 weeks 3. BETTER-SAME-WORSE: "Are you getting better, staying the same, or getting worse compared to the day you were discharged?" 4.  DIAGNOSIS: "Was patient diagnosed with dementia by a doctor?" If yes, "When?" (e.g., days, months, years ago) 5.  MEDICATION: "Has there been any change in medications recently?" (e.g., narcotics, antihistamines, benzodiazepines, etc.) 6.  OTHER SYMPTOMS: "Are there any other symptoms?" (e.g., fever, cough, pain, falling)     Yes has been diagnosed with dementia. Taking medication. Seems to be more weaker. 7. SUPPORT: Document living circumstances and support (e.g., family, nursing home)     Family, care giver  Protocols used: DEMENTIA SYMPTOMS AND QUESTIONS-A-AH

## 2018-08-02 NOTE — Telephone Encounter (Signed)
Order prointed

## 2018-08-02 NOTE — Telephone Encounter (Addendum)
I talked with daughter/veronica---home health aide has been with patient since daughter called earlier, patient is acting better, more like her usual self now---I have advised that since both bp meds are given in the a.m., aide should check patient's blood pressure in the am BEFORE giving bp meds to make sure bp reading is not too low---pt has trended low in the past, and could explain her sluggish behavior at times---daughter will be keeping appt on Monday 12/2, to also get order for hospital bed during that visit--if patient worsens, esp since our office is closed on Thursday, to call 911 and go to ED if needed---routing to [place], [place]...Marland Kitchen..Marland Kitchen

## 2018-08-07 ENCOUNTER — Ambulatory Visit (INDEPENDENT_AMBULATORY_CARE_PROVIDER_SITE_OTHER): Payer: Medicare Other | Admitting: Nurse Practitioner

## 2018-08-07 ENCOUNTER — Encounter: Payer: Self-pay | Admitting: Nurse Practitioner

## 2018-08-07 VITALS — BP 140/90 | HR 73 | Ht 65.0 in | Wt 130.0 lb

## 2018-08-07 DIAGNOSIS — I10 Essential (primary) hypertension: Secondary | ICD-10-CM

## 2018-08-07 DIAGNOSIS — E782 Mixed hyperlipidemia: Secondary | ICD-10-CM

## 2018-08-07 DIAGNOSIS — F0391 Unspecified dementia with behavioral disturbance: Secondary | ICD-10-CM | POA: Diagnosis not present

## 2018-08-07 MED ORDER — ATORVASTATIN CALCIUM 80 MG PO TABS: 80.0000 mg | ORAL_TABLET | ORAL | 0 refills | Status: DC

## 2018-08-07 NOTE — Assessment & Plan Note (Addendum)
BP stable today without medications Suspect weakness could have been related to hypotension, will discontinue losartan and continue amlodipine only for now Continue to monitor BP readings at home- discussed that BP goal could be closer to 150/90 due to age, advanced dementia RTC in 1 month for F/U -recheck BP, consider decreasing amlodipine to 5 daily if stable

## 2018-08-07 NOTE — Progress Notes (Signed)
Kendra Nixon is a 77 y.o. female with the following history as recorded in Epic[facility]:  Patient Active Problem List   Diagnosis Date Noted  . Seizure-like activity (HCC) 01/03/2017  . 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 Acute delirium 09/02/2016  . Dementia (HCC) 08/31/2016  . CVA (cerebral vascular accident) (HCC) 08/31/2016  . Osteoarthritis 12/18/2015  . Routine general medical examination at a health care facility 01/29/2015  . Medicare annual wellness visit, subsequent 01/29/2015  . Fracture, intertrochanteric, right femur (HCC) 08/15/2014  . Hyperlipidemia 08/15/2014  . Essential hypertension 08/15/2014  . Tobacco use 08/15/2014  . Femur fracture (HCC)   . PAC (premature atrial contraction) 01/30/2013  . Syncope 01/29/2013  . Sinus bradycardia 01/29/2013  . First degree heart block 01/29/2013  . Pulmonary nodule 11/04/2012    Current Outpatient Medications  Medication Sig Dispense Refill  . amLODipine (NORVASC) 10 MG tablet TAKE 1 TABLET BY MOUTH ONCE DAILY 90 tablet 0  . aspirin 325 MG tablet Take 1 tablet (325 mg total) by mouth daily. 30 tablet 0  . atorvastatin (LIPITOR) 80 MG tablet Take 1 tablet (80 mg total) by mouth every other day. 90 tablet 0  . divalproex (DEPAKOTE ER) 250 MG 24 hr tablet Take 2 tablets every evening 180 tablet 3  . Multiple Vitamin (MULTIVITAMIN) tablet Take 1 tablet by mouth daily.    . QUEtiapine (SEROQUEL) 25 MG tablet Take 1/2 tablet in AM, then 1.5 tablets every night 180 tablet 3   No current facility-administered medications for this visit.     Allergies: Patient has no known allergies.  Past Medical History:  Diagnosis Date  . ACE-inhibitor cough   . Arthritis    "all over" (09/01/2016)  . CVA (cerebral vascular accident) (HCC)    Old left basal ganglion lacunar infarct noted per CT head (01/2013)  . CVA (cerebral vascular accident) (HCC) 08/31/2016   "verbal before; nonverbal now; weak all over now" (09/01/2016)  .  Dementia (HCC)    Hattie Perch 08/31/2016  . Hyperlipidemia LDL goal < 100   . Hypertension   . Solitary lung nodule    4 mm right upper lobe nodule  . UTI (urinary tract infection) 08/31/2016   Hattie Perch 08/31/2016    Past Surgical History:  Procedure Laterality Date  . BREAST LUMPECTOMY Bilateral 1960   "benign"  . FEMUR IM NAIL Right 08/16/2014   Procedure: INTRAMEDULLARY (IM) NAIL FEMORAL;  Surgeon: Sheral Apley, MD;  Location: [facility] OR;  Service: Orthopedics;  Laterality: Right;  . FRACTURE SURGERY      Family History  Problem Relation Age of Onset  . Breast cancer Sister 50  . Hypertension Unknown   . Alzheimer's disease Maternal Grandmother   . Alzheimer's disease Maternal Aunt   . Arthritis Mother   . Heart disease Mother   . Heart disease Father     Social History   Tobacco Use  . Smoking status: Former Smoker    Packs/day: 0.10    Years: 30.00    Pack years: 3.00    Types: Cigarettes  . Smokeless tobacco: Never Used  Substance Use Topics  . Alcohol use: No     Subjective:   Kendra Nixon is here today for evaluation of an episode of weakness which occurred last week. She is accompanied by her daughter who is her primary caregiver and provides history today. She has a history of advanced dementia, and is mostly nonverbal, in wheelchair. Her daughter tells me that last Wednesday, she  came home to find her mother "slumped over in wheelchair, not responding as normal." her daughter says she remained lethargic but alert for several hours, needed extra help to get her mother moved around that day, from chair to bed etc. Her daughter reached out to nurse who was sitting with mother that day and found that her blood pressure reading had been lower that Wednesday morning, but the nurse still gave her both of her blood pressure pills- losartan 100, amlodipine 10. After several hours, her daughter says patient improved back to her baseline and has been at her normal since. She has not  had any noted fevers, chills, abnormal bleeding, seizures, changes in bowel or bladder habits Daughter held blood pressure medications today, to see how her blood pressure was off medications  ROS- See HPI  Objective:  Vitals:   08/07/18 1028 08/07/18 1058  BP: 120/80 140/90  Pulse: 73   SpO2: 98%   Weight: 130 lb (59 kg)   Height: 5\' 5"  (1.651 m)    General: Well developed, well nourished, in no acute distress  Skin : Warm and dry.  Head: Normocephalic and atraumatic  Eyes: Sclera and conjunctiva clear; pupils round and reactive to light; extraocular movements intact  Oropharynx: Pink, supple. No suspicious lesions  Neck: Supple Lungs: Effort normal, No respiratory distress. CVS exam: normal rate and regular rhythm.  Musculoskeletal: No deformities; no active joint inflammation  Extremities: No edema, cyanosis, clubbing  Vessels: Symmetric bilaterally  Neurologic: She is alert, nonverbal. She does not follow commands. In wheelchair. Psychiatric: Normal mood. Abnormal memory.  Assessment:  1. Dementia with behavioral disturbance, unspecified dementia type (HCC)   2. Mixed hyperlipidemia   3. Essential hypertension     Plan:   Return in about 4 weeks (around 09/04/2018) for F/U- HTN-stopping losartan - recheck BP.  No orders of the defined types were placed in this encounter.   Requested Prescriptions   Signed Prescriptions Disp Refills  . atorvastatin (LIPITOR) 80 MG tablet 90 tablet 0    Sig: Take 1 tablet (80 mg total) by mouth every other day.

## 2018-08-07 NOTE — Assessment & Plan Note (Signed)
Due to age, advanced dementia, we discussed decreasing statin to qod dosing due to risks vs benefits Lipid panel up to date Lab Results  Component Value Date   CHOL 174 03/16/2018   HDL 56.40 03/16/2018   LDLCALC 106 (H) 03/16/2018   TRIG 62.0 03/16/2018   CHOLHDL 3 03/16/2018

## 2018-08-07 NOTE — Assessment & Plan Note (Signed)
Stable We did discuss natural progression of dementia, daughter realizes/accepts that this is a terminal diagnosis for mom, does not want unnecessary tests/procedures, declines health maintenance, but does not feel like she is ready for palliative care at this time Her next neurology follow up is in January, daughter plans to discuss future plans for mother with neurologist at upcoming follow up as well

## 2018-08-07 NOTE — Patient Instructions (Addendum)
Stop losartan Continue amlodipine.  Decrease atorvastatin to every other day dosing  I will see you back in 1 month.

## 2018-08-15 ENCOUNTER — Telehealth: Payer: Self-pay | Admitting: *Deleted

## 2018-08-15 NOTE — Telephone Encounter (Signed)
Copied from CRM [id number]. Topic: Quick Communication - See Telephone Encounter >> Aug 15, 2018  9:52 AM Herby AbrahamJohnson, [name] wrote: CRM for notification. See Telephone encounter for: 08/15/18.  Daughter states that she received a call from [employer] stating that they faxed over a medical necessity form (for hospital bed) to PCP and they have not received form back as of yet? Daughter is checking on the status of the form. >> Aug 15, 2018  9:56 AM Stephannie LiSimmons, [name], NT wrote:  [name] from [facility]  called to make sure the paper work for the patients bed was received , please him at [phone] x [phone]

## 2018-08-15 NOTE — Telephone Encounter (Signed)
I called [name] at number below and left detailed message informing him  form has been received and is waiting for PCP to return to office for review/completion.

## 2018-09-06 DIAGNOSIS — N179 Acute kidney failure, unspecified: Secondary | ICD-10-CM

## 2018-09-06 HISTORY — DX: Acute kidney failure, unspecified: N17.9

## 2018-09-12 ENCOUNTER — Telehealth: Payer: Self-pay | Admitting: Nurse Practitioner

## 2018-09-12 NOTE — Telephone Encounter (Signed)
Pt daughter came in and lsot her Medicaid, they need a letter written and faxed to [facility], fax number [phone], Phone number [phone], Raynelle Bring is the caseworker, needs letter stating the patient is incompetent and has dementia so she can get reenrolled for the insurance.   Medicaid Special Assistance Re-enrollment   Please advise and for questions please call [name]

## 2018-09-13 NOTE — Telephone Encounter (Signed)
Note has been printed

## 2018-09-13 NOTE — Telephone Encounter (Signed)
Fax number below. Is incomplete. I called Bosie Clos, caseworker to get correct fax #. No answer. Will try again later.

## 2018-09-13 NOTE — Telephone Encounter (Signed)
Signed letter faxed to [employer] fax # [phone]. Pt's daughter, [name] informed.

## 2018-09-18 ENCOUNTER — Ambulatory Visit: Payer: Medicare Other | Admitting: Nurse Practitioner

## 2018-09-22 ENCOUNTER — Inpatient Hospital Stay (HOSPITAL_COMMUNITY): Payer: Medicare Other

## 2018-09-22 ENCOUNTER — Other Ambulatory Visit: Payer: Self-pay

## 2018-09-22 ENCOUNTER — Inpatient Hospital Stay (HOSPITAL_COMMUNITY)
Admission: EM | Admit: 2018-09-22 | Discharge: 2018-09-30 | DRG: 682 | Disposition: A | Discharge status: Hospice | Payer: Medicare Other | Attending: Family Medicine | Admitting: Family Medicine

## 2018-09-22 ENCOUNTER — Ambulatory Visit: Payer: Medicare Other

## 2018-09-22 ENCOUNTER — Encounter: Payer: Self-pay | Admitting: Nurse Practitioner

## 2018-09-22 ENCOUNTER — Encounter (HOSPITAL_COMMUNITY): Payer: Self-pay | Admitting: *Deleted

## 2018-09-22 ENCOUNTER — Ambulatory Visit (INDEPENDENT_AMBULATORY_CARE_PROVIDER_SITE_OTHER): Payer: Medicare Other | Admitting: Nurse Practitioner

## 2018-09-22 ENCOUNTER — Emergency Department (HOSPITAL_COMMUNITY): Payer: Medicare Other

## 2018-09-22 VITALS — HR 105 | Ht 65.0 in

## 2018-09-22 DIAGNOSIS — I82512 Chronic embolism and thrombosis of left femoral vein: Secondary | ICD-10-CM | POA: Diagnosis present

## 2018-09-22 DIAGNOSIS — F0150 Vascular dementia without behavioral disturbance: Secondary | ICD-10-CM | POA: Diagnosis not present

## 2018-09-22 DIAGNOSIS — Z66 Do not resuscitate: Secondary | ICD-10-CM | POA: Diagnosis not present

## 2018-09-22 DIAGNOSIS — Z87440 Personal history of urinary (tract) infections: Secondary | ICD-10-CM

## 2018-09-22 DIAGNOSIS — E860 Dehydration: Secondary | ICD-10-CM | POA: Diagnosis present

## 2018-09-22 DIAGNOSIS — E872 Acidosis: Secondary | ICD-10-CM | POA: Diagnosis present

## 2018-09-22 DIAGNOSIS — I69351 Hemiplegia and hemiparesis following cerebral infarction affecting right dominant side: Secondary | ICD-10-CM

## 2018-09-22 DIAGNOSIS — I10 Essential (primary) hypertension: Secondary | ICD-10-CM | POA: Diagnosis present

## 2018-09-22 DIAGNOSIS — R569 Unspecified convulsions: Secondary | ICD-10-CM

## 2018-09-22 DIAGNOSIS — I82432 Acute embolism and thrombosis of left popliteal vein: Secondary | ICD-10-CM | POA: Diagnosis present

## 2018-09-22 DIAGNOSIS — M1990 Unspecified osteoarthritis, unspecified site: Secondary | ICD-10-CM | POA: Diagnosis present

## 2018-09-22 DIAGNOSIS — R609 Edema, unspecified: Secondary | ICD-10-CM

## 2018-09-22 DIAGNOSIS — I639 Cerebral infarction, unspecified: Secondary | ICD-10-CM | POA: Diagnosis present

## 2018-09-22 DIAGNOSIS — I82412 Acute embolism and thrombosis of left femoral vein: Secondary | ICD-10-CM

## 2018-09-22 DIAGNOSIS — Z515 Encounter for palliative care: Secondary | ICD-10-CM

## 2018-09-22 DIAGNOSIS — Z8249 Family history of ischemic heart disease and other diseases of the circulatory system: Secondary | ICD-10-CM

## 2018-09-22 DIAGNOSIS — F05 Delirium due to known physiological condition: Secondary | ICD-10-CM | POA: Diagnosis present

## 2018-09-22 DIAGNOSIS — Z993 Dependence on wheelchair: Secondary | ICD-10-CM

## 2018-09-22 DIAGNOSIS — E785 Hyperlipidemia, unspecified: Secondary | ICD-10-CM | POA: Diagnosis present

## 2018-09-22 DIAGNOSIS — R627 Adult failure to thrive: Secondary | ICD-10-CM | POA: Diagnosis present

## 2018-09-22 DIAGNOSIS — I959 Hypotension, unspecified: Secondary | ICD-10-CM | POA: Diagnosis present

## 2018-09-22 DIAGNOSIS — L89152 Pressure ulcer of sacral region, stage 2: Secondary | ICD-10-CM | POA: Diagnosis present

## 2018-09-22 DIAGNOSIS — Z681 Body mass index (BMI) 19 or less, adult: Secondary | ICD-10-CM | POA: Diagnosis not present

## 2018-09-22 DIAGNOSIS — Z820 Family history of epilepsy and other diseases of the nervous system: Secondary | ICD-10-CM

## 2018-09-22 DIAGNOSIS — F0151 Vascular dementia with behavioral disturbance: Secondary | ICD-10-CM

## 2018-09-22 DIAGNOSIS — M7989 Other specified soft tissue disorders: Secondary | ICD-10-CM

## 2018-09-22 DIAGNOSIS — Z7189 Other specified counseling: Secondary | ICD-10-CM

## 2018-09-22 DIAGNOSIS — G9341 Metabolic encephalopathy: Secondary | ICD-10-CM | POA: Diagnosis present

## 2018-09-22 DIAGNOSIS — E782 Mixed hyperlipidemia: Secondary | ICD-10-CM

## 2018-09-22 DIAGNOSIS — R64 Cachexia: Secondary | ICD-10-CM | POA: Diagnosis present

## 2018-09-22 DIAGNOSIS — L8990 Pressure ulcer of unspecified site, unspecified stage: Secondary | ICD-10-CM

## 2018-09-22 DIAGNOSIS — Z87891 Personal history of nicotine dependence: Secondary | ICD-10-CM

## 2018-09-22 DIAGNOSIS — R1310 Dysphagia, unspecified: Secondary | ICD-10-CM | POA: Diagnosis present

## 2018-09-22 DIAGNOSIS — I63012 Cerebral infarction due to thrombosis of left vertebral artery: Secondary | ICD-10-CM | POA: Diagnosis not present

## 2018-09-22 DIAGNOSIS — E870 Hyperosmolality and hypernatremia: Secondary | ICD-10-CM | POA: Diagnosis present

## 2018-09-22 DIAGNOSIS — F0390 Unspecified dementia without behavioral disturbance: Secondary | ICD-10-CM | POA: Diagnosis present

## 2018-09-22 DIAGNOSIS — Z79899 Other long term (current) drug therapy: Secondary | ICD-10-CM

## 2018-09-22 DIAGNOSIS — N179 Acute kidney failure, unspecified: Secondary | ICD-10-CM | POA: Diagnosis present

## 2018-09-22 DIAGNOSIS — Z7982 Long term (current) use of aspirin: Secondary | ICD-10-CM

## 2018-09-22 HISTORY — DX: Acute kidney failure, unspecified: N17.9

## 2018-09-22 LAB — CBC WITH DIFFERENTIAL/PLATELET
Abs Immature Granulocytes: 0.08 10*3/uL — ABNORMAL HIGH (ref 0.00–0.07)
Basophils Absolute: 0 10*3/uL (ref 0.0–0.1)
Basophils Relative: 0 %
Eosinophils Absolute: 0 10*3/uL (ref 0.0–0.5)
Eosinophils Relative: 0 %
HCT: 43.9 % (ref 36.0–46.0)
Hemoglobin: 13.2 g/dL (ref 12.0–15.0)
IMMATURE GRANULOCYTES: 1 %
LYMPHS PCT: 17 %
Lymphs Abs: 2 10*3/uL (ref 0.7–4.0)
MCH: 28.6 pg (ref 26.0–34.0)
MCHC: 30.1 g/dL (ref 30.0–36.0)
MCV: 95 fL (ref 80.0–100.0)
Monocytes Absolute: 0.7 10*3/uL (ref 0.1–1.0)
Monocytes Relative: 6 %
NEUTROS PCT: 76 %
NRBC: 0.3 % — AB (ref 0.0–0.2)
Neutro Abs: 8.8 10*3/uL — ABNORMAL HIGH (ref 1.7–7.7)
Platelets: 170 10*3/uL (ref 150–400)
RBC: 4.62 MIL/uL (ref 3.87–5.11)
RDW: 15.6 % — ABNORMAL HIGH (ref 11.5–15.5)
WBC: 11.6 10*3/uL — ABNORMAL HIGH (ref 4.0–10.5)

## 2018-09-22 LAB — COMPREHENSIVE METABOLIC PANEL
ALT: 26 U/L (ref 0–44)
AST: 63 U/L — ABNORMAL HIGH (ref 15–41)
Albumin: 2.9 g/dL — ABNORMAL LOW (ref 3.5–5.0)
Alkaline Phosphatase: 71 U/L (ref 38–126)
Anion gap: 19 — ABNORMAL HIGH (ref 5–15)
BUN: 25 mg/dL — ABNORMAL HIGH (ref 8–23)
CO2: 18 mmol/L — AB (ref 22–32)
Calcium: 9.8 mg/dL (ref 8.9–10.3)
Chloride: 114 mmol/L — ABNORMAL HIGH (ref 98–111)
Creatinine, Ser: 1.38 mg/dL — ABNORMAL HIGH (ref 0.44–1.00)
GFR calc non Af Amer: 36 mL/min — ABNORMAL LOW (ref >60)
GFR, EST AFRICAN AMERICAN: 42 mL/min — AB (ref >60)
GLUCOSE: 119 mg/dL — AB (ref 70–99)
Potassium: 4.3 mmol/L (ref 3.5–5.1)
SODIUM: 151 mmol/L — AB (ref 135–145)
Total Bilirubin: 1.2 mg/dL (ref 0.3–1.2)
Total Protein: 7.5 g/dL (ref 6.5–8.1)

## 2018-09-22 LAB — URINALYSIS, COMPLETE (UACMP) WITH MICROSCOPIC
Bilirubin Urine: NEGATIVE
GLUCOSE, UA: NEGATIVE mg/dL
Hgb urine dipstick: NEGATIVE
Ketones, ur: 20 mg/dL — AB
Leukocytes, UA: NEGATIVE
Nitrite: NEGATIVE
Protein, ur: 30 mg/dL — AB
Specific Gravity, Urine: 1.026 (ref 1.005–1.030)
pH: 5 (ref 5.0–8.0)

## 2018-09-22 LAB — CBC
HEMATOCRIT: 38.8 % (ref 36.0–46.0)
HEMOGLOBIN: 12.4 g/dL (ref 12.0–15.0)
MCH: 28.6 pg (ref 26.0–34.0)
MCHC: 32 g/dL (ref 30.0–36.0)
MCV: 89.6 fL (ref 80.0–100.0)
Platelets: 160 10*3/uL (ref 150–400)
RBC: 4.33 MIL/uL (ref 3.87–5.11)
RDW: 15.3 % (ref 11.5–15.5)
WBC: 10.2 10*3/uL (ref 4.0–10.5)
nRBC: 0.2 % (ref 0.0–0.2)

## 2018-09-22 LAB — CREATININE, SERUM
Creatinine, Ser: 1.02 mg/dL — ABNORMAL HIGH (ref 0.44–1.00)
GFR calc Af Amer: >60 mL/min (ref >60)
GFR calc non Af Amer: 52 mL/min — ABNORMAL LOW (ref >60)

## 2018-09-22 LAB — TSH: TSH: 2.126 u[IU]/mL (ref 0.350–4.500)

## 2018-09-22 LAB — VALPROIC ACID LEVEL: Valproic Acid Lvl: 21 ug/mL — ABNORMAL LOW (ref 50.0–100.0)

## 2018-09-22 LAB — CK: Total CK: 194 U/L (ref 38–234)

## 2018-09-22 LAB — CBG MONITORING, ED: Glucose-Capillary: 106 mg/dL — ABNORMAL HIGH (ref 70–99)

## 2018-09-22 LAB — AMMONIA: Ammonia: 38 umol/L — ABNORMAL HIGH (ref 9–35)

## 2018-09-22 MED ORDER — HEPARIN BOLUS VIA INFUSION
1500.0000 [IU] | Freq: Once | INTRAVENOUS | Status: AC
  Administered 2018-09-22: 1500 [IU] via INTRAVENOUS
  Filled 2018-09-22: qty 1500

## 2018-09-22 MED ORDER — QUETIAPINE FUMARATE 25 MG PO TABS
12.5000 mg | ORAL_TABLET | Freq: Every day | ORAL | Status: DC
  Administered 2018-09-23 – 2018-09-27 (×3): 12.5 mg via ORAL
  Filled 2018-09-22 (×4): qty 1

## 2018-09-22 MED ORDER — ONDANSETRON HCL 4 MG PO TABS: 4.0000 mg | ORAL_TABLET | Freq: Four times a day (QID) | ORAL | Status: DC | PRN

## 2018-09-22 MED ORDER — DEXTROSE-NACL 5-0.45 % IV SOLN
INTRAVENOUS | Status: DC
  Administered 2018-09-23: 1000 mL via INTRAVENOUS
  Administered 2018-09-24 – 2018-09-29 (×8): via INTRAVENOUS

## 2018-09-22 MED ORDER — ACETAMINOPHEN 650 MG RE SUPP: 650.0000 mg | Freq: Four times a day (QID) | RECTAL | Status: DC | PRN

## 2018-09-22 MED ORDER — DIVALPROEX SODIUM ER 500 MG PO TB24
500.0000 mg | ORAL_TABLET | Freq: Every day | ORAL | Status: DC
  Administered 2018-09-22 – 2018-09-23 (×2): 500 mg via ORAL
  Filled 2018-09-22 (×3): qty 1

## 2018-09-22 MED ORDER — ATORVASTATIN CALCIUM 80 MG PO TABS
80.0000 mg | ORAL_TABLET | ORAL | Status: DC
  Administered 2018-09-22: 80 mg via ORAL
  Filled 2018-09-22 (×4): qty 1

## 2018-09-22 MED ORDER — SODIUM CHLORIDE 0.9 % IV SOLN
INTRAVENOUS | Status: DC
  Administered 2018-09-22: 14:00:00 via INTRAVENOUS

## 2018-09-22 MED ORDER — SODIUM CHLORIDE 0.9 % IV SOLN
1.0000 g | INTRAVENOUS | Status: DC
  Administered 2018-09-22 – 2018-09-23 (×2): 1 g via INTRAVENOUS
  Filled 2018-09-22 (×2): qty 10

## 2018-09-22 MED ORDER — ASPIRIN 325 MG PO TABS
325.0000 mg | ORAL_TABLET | Freq: Every day | ORAL | Status: DC
  Administered 2018-09-22 – 2018-09-27 (×4): 325 mg via ORAL
  Filled 2018-09-22 (×5): qty 1

## 2018-09-22 MED ORDER — ONDANSETRON HCL 4 MG/2ML IJ SOLN: 4.0000 mg | Freq: Four times a day (QID) | INTRAMUSCULAR | Status: DC | PRN

## 2018-09-22 MED ORDER — ENSURE ENLIVE PO LIQD
237.0000 mL | Freq: Two times a day (BID) | ORAL | Status: DC
  Administered 2018-09-23 – 2018-09-27 (×3): 237 mL via ORAL
  Filled 2018-09-22: qty 237

## 2018-09-22 MED ORDER — HEPARIN (PORCINE) 25000 UT/250ML-% IV SOLN
850.0000 [IU]/h | INTRAVENOUS | Status: DC
  Administered 2018-09-22: 850 [IU]/h via INTRAVENOUS
  Filled 2018-09-22: qty 250

## 2018-09-22 MED ORDER — DEXTROSE-NACL 5-0.45 % IV SOLN
INTRAVENOUS | Status: DC
  Administered 2018-09-22: 1000 mL via INTRAVENOUS

## 2018-09-22 MED ORDER — ACETAMINOPHEN 325 MG PO TABS: 650.0000 mg | ORAL_TABLET | Freq: Four times a day (QID) | ORAL | Status: DC | PRN

## 2018-09-22 MED ORDER — QUETIAPINE FUMARATE 25 MG PO TABS
25.0000 mg | ORAL_TABLET | Freq: Every day | ORAL | Status: DC
  Administered 2018-09-22 – 2018-09-23 (×2): 25 mg via ORAL
  Filled 2018-09-22 (×3): qty 1

## 2018-09-22 MED ORDER — HEPARIN SODIUM (PORCINE) 5000 UNIT/ML IJ SOLN: 5000.0000 [IU] | Freq: Three times a day (TID) | INTRAMUSCULAR | Status: DC

## 2018-09-22 MED ORDER — ADULT MULTIVITAMIN W/MINERALS CH
1.0000 | ORAL_TABLET | Freq: Every day | ORAL | Status: DC
  Administered 2018-09-23 – 2018-09-27 (×2): 1 via ORAL
  Filled 2018-09-22 (×4): qty 1

## 2018-09-22 NOTE — Progress Notes (Unsigned)
Subjective:   Kendra Nixon is a 78 y.o. female who presents for Medicare Annual (Subsequent) preventive examination.  Review of Systems:  No ROS.  Medicare Wellness Visit. Additional risk factors are reflected in the social history.   Sleep patterns: {SX; SLEEP PATTERNS:18802::"feels rested on waking","does not get up to void","gets up *** times nightly to void","sleeps *** hours nightly"}.    Home Safety/Smoke Alarms: Feels safe in home. Smoke alarms in place.  Living environment; residence and Firearm Safety: {Rehab home environment / accessibility:30080::"no firearms","firearms stored safely"}. Seat Belt Safety/Bike Helmet: Wears seat belt.      Objective:     Vitals: There were no vitals taken for this visit.  There is no height or weight on file to calculate BMI.  Advanced Directives 08/31/2016 08/15/2014 01/29/2013  Does Patient Have a Medical Advance Directive? No No Patient does not have advance directive;Patient would not like information  Would patient like information on creating a medical advance directive? No - Patient declined No - patient declined information -  Pre-existing out of facility DNR order (yellow form or pink MOST form) - - No    Tobacco Social History   Tobacco Use  Smoking Status Former Smoker  . Packs/day: 0.10  . Years: 30.00  . Pack years: 3.00  . Types: Cigarettes  Smokeless Tobacco Never Used     Counseling given: Not Answered  Past Medical History:  Diagnosis Date  . ACE-inhibitor cough   . Arthritis    "all over" (09/01/2016)  . CVA (cerebral vascular accident) (HCC)    Old left basal ganglion lacunar infarct noted per CT head (01/2013)  . CVA (cerebral vascular accident) (HCC) 08/31/2016   "verbal before; nonverbal now; weak all over now" (09/01/2016)  . Dementia (HCC)    Kendra Nixon/notes 08/31/2016  . Hyperlipidemia LDL goal < 100   . Hypertension   . Solitary lung nodule    4 mm right upper lobe nodule  . UTI (urinary tract  infection) 08/31/2016   Kendra Nixon/notes 08/31/2016   Past Surgical History:  Procedure Laterality Date  . BREAST LUMPECTOMY Bilateral 1960   "benign"  . FEMUR IM NAIL Right 08/16/2014   Procedure: INTRAMEDULLARY (IM) NAIL FEMORAL;  Surgeon: Sheral Apleyimothy D Murphy, MD;  Location: [facility] OR;  Service: Orthopedics;  Laterality: Right;  . FRACTURE SURGERY     Family History  Problem Relation Age of Onset  . Breast cancer Sister [id number]  . Hypertension Unknown   . Alzheimer's disease Maternal Grandmother   . Alzheimer's disease Maternal Aunt   . Arthritis Mother   . Heart disease Mother   . Heart disease Father    Social History   Socioeconomic History  . Marital status: Widowed    Spouse name: Not on file  . Number of children: 3  . Years of education: 11th grade  . Highest education level: Not on file  Occupational History  . 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 Financial resource strain: Not on file  . Food insecurity:    Worry: Not on file    Inability: Not on file  . Transportation needs:    Medical: Not on file    Non-medical: Not on file  Tobacco Use  . Smoking status: Former Smoker    Packs/day: 0.10    Years: 30.00    Pack years: 3.00    Types: Cigarettes  . Smokeless tobacco: Never Used  Substance and  Sexual Activity  . Alcohol use: No  . Drug use: No  . Sexual activity: Not on file  Lifestyle  . Physical activity:    Days per week: Not on file    Minutes per session: Not on file  . Stress: Not on file  Relationships  . Social connections:    Talks on phone: Not on file    Gets together: Not on file    Attends religious service: Not on file    Active member of club or organization: Not on file    Attends meetings of clubs or organizations: Not on file    Relationship status: Not on file  Other Topics Concern  . 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 amLODipine (NORVASC) 10 MG tablet TAKE 1 TABLET BY MOUTH ONCE DAILY  . aspirin 325 MG tablet Take 1 tablet (325 mg total) by mouth daily.  Marland Kitchen atorvastatin (LIPITOR) 80 MG tablet Take 1 tablet (80 mg total) by mouth every other day.  . divalproex (DEPAKOTE ER) 250 MG 24 hr tablet Take 2 tablets every evening  . Multiple Vitamin (MULTIVITAMIN) tablet Take 1 tablet by mouth daily.  . QUEtiapine (SEROQUEL) 25 MG tablet Take 1/2 tablet in AM, then 1.5 tablets every night   No facility-administered encounter medications on file as of 09/22/2018.     Activities of Daily Living No flowsheet data found.  Patient Care Team: Kendra Bury, NP as PCP - General (Nurse Practitioner)    Assessment:   This is a routine wellness examination for [name]. Physical assessment deferred to PCP.  Exercise Activities and Dietary recommendations   Diet (meal preparation, eat out, water intake, caffeinated beverages, dairy products, fruits and vegetables): {Desc; diets:16563}   Goals   None     Fall Risk Fall Risk  03/17/2018 09/29/2017 07/07/2017 01/29/2015  Falls in the past year? No No No No    Depression Screen PHQ 2/9 Scores 07/07/2017 01/29/2015  PHQ - 2 Score 0 0     Cognitive Function MMSE - Mini Mental State Exam 03/17/2018 01/29/2015  Not completed: Unable to complete -  Orientation to time - 3  Orientation to Place - 5  Registration - 3  Attention/ Calculation - 5  Recall - 3  Language- name 2 objects - 2  Language- repeat - 1  Language- follow 3 step command - 3  Language- read & follow direction - 1  Write a sentence - 1  Copy design - 1  Total score - 28        Immunization History  Administered Date(s) Administered  . Influenza, High Dose Seasonal PF 08/12/2016  .  Influenza,inj,Quad PF,6+ Mos 08/14/2015   Screening Tests Health Maintenance  Topic Date Due  . TETANUS/TDAP  11/[birth year]/1957  . PNA vac Low Risk Adult (1 of 2 - PCV13) 11/26/2003  . INFLUENZA VACCINE  12/06/2018 (Originally 04/06/2018)  . DEXA SCAN  08/08/2019 (Originally 11/26/2003)      Plan:     I have personally reviewed and noted the following in the patient's chart:   . Medical and social history . Use of alcohol, tobacco or illicit drugs  . Current medications and supplements . Functional ability  and status . Nutritional status . Physical activity . Advanced directives . List of other physicians . Vitals . Screenings to include cognitive, depression, and falls . Referrals and appointments  In addition, I have reviewed and discussed with patient certain preventive protocols, quality metrics, and best practice recommendations. A written personalized care plan for preventive services as well as general preventive health recommendations were provided to patient.     Wanda Plump, RN  09/22/2018

## 2018-09-22 NOTE — Progress Notes (Signed)
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 Will continue to monitor.

## 2018-09-22 NOTE — Progress Notes (Signed)
Right lower extremity venous duplex exam done. Very limited access due to patient combativeness with extremely contracted body and stiffness.  Acute deep veins thrombosis noticed on Popliteal vein. 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  Preliminary notes available on CV PROC under chart review. Result called RN and she is aware of this.  Lejon Afzal H Jocilynn Grade(RDMS RVT) 09/22/18 6:46 PM

## 2018-09-22 NOTE — ED Notes (Signed)
portable xray at bedside

## 2018-09-22 NOTE — ED Triage Notes (Signed)
Pt lives at home with her daughter where her daughter takes care of her.  Daughter states that she has been declining and eating and drinking less.  The decline has been over the past month and she finally was unable to take any po today.  Pt appears cool and weak. At baseline pt is in Scientist, product/process developmentweelchair and is verbal and needs assistance with ADL.  Pt has developed some skin sores, one on left shin.  Temp would not read in triage

## 2018-09-22 NOTE — Progress Notes (Addendum)
Patient trasfered from ED to [phone] via stretcher; disoriented x 4; no signs and symptoms of acute distress; IV saline locked in LW - fluids started at 100 cc/hr per MD order; skin  - see flowsheets. Orient patient and her daughter to room and unit; gave patient care guide; instructed how to use the call bell and  fall risk precautions. Will continue to monitor the patient.

## 2018-09-22 NOTE — Progress Notes (Signed)
Kendra Nixon is a 78 y.o. female with the following history as recorded in [employer]:  Patient Active Problem List   Diagnosis Date Noted  . Seizure-like activity (HCC) 01/03/2017  . 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 Acute delirium 09/02/2016  . Dementia (HCC) 08/31/2016  . CVA (cerebral vascular accident) (HCC) 08/31/2016  . Osteoarthritis 12/18/2015  . Routine general medical examination at a health care facility 01/29/2015  . Medicare annual wellness visit, subsequent 01/29/2015  . Fracture, intertrochanteric, right femur (HCC) 08/15/2014  . Hyperlipidemia 08/15/2014  . Essential hypertension 08/15/2014  . Tobacco use 08/15/2014  . Femur fracture (HCC)   . PAC (premature atrial contraction) 01/30/2013  . Syncope 01/29/2013  . Sinus bradycardia 01/29/2013  . First degree heart block 01/29/2013  . Pulmonary nodule 11/04/2012    Current Outpatient Medications  Medication Sig Dispense Refill  . amLODipine (NORVASC) 10 MG tablet TAKE 1 TABLET BY MOUTH ONCE DAILY 90 tablet 0  . aspirin 325 MG tablet Take 1 tablet (325 mg total) by mouth daily. 30 tablet 0  . atorvastatin (LIPITOR) 80 MG tablet Take 1 tablet (80 mg total) by mouth every other day. 90 tablet 0  . divalproex (DEPAKOTE ER) 250 MG 24 hr tablet Take 2 tablets every evening 180 tablet 3  . Multiple Vitamin (MULTIVITAMIN) tablet Take 1 tablet by mouth daily.    . QUEtiapine (SEROQUEL) 25 MG tablet Take 1/2 tablet in AM, then 1.5 tablets every night 180 tablet 3   No current facility-administered medications for this visit.     Allergies: Patient has no known allergies.  Past Medical History:  Diagnosis Date  . ACE-inhibitor cough   . Arthritis    "all over" (09/01/2016)  . CVA (cerebral vascular accident) (HCC)    Old left basal ganglion lacunar infarct noted per CT head (01/2013)  . CVA (cerebral vascular accident) (HCC) 08/31/2016   "verbal before; nonverbal now; weak all over now" (09/01/2016)  .  Dementia (HCC)    Hattie Perch 08/31/2016  . Hyperlipidemia LDL goal < 100   . Hypertension   . Solitary lung nodule    4 mm right upper lobe nodule  . UTI (urinary tract infection) 08/31/2016   Hattie Perch 08/31/2016    Past Surgical History:  Procedure Laterality Date  . BREAST LUMPECTOMY Bilateral 1960   "benign"  . FEMUR IM NAIL Right 08/16/2014   Procedure: INTRAMEDULLARY (IM) NAIL FEMORAL;  Surgeon: Sheral Apley, MD;  Location: [facility] OR;  Service: Orthopedics;  Laterality: Right;  . FRACTURE SURGERY      Family History  Problem Relation Age of Onset  . Breast cancer Sister 53  . Hypertension Unknown   . Alzheimer's disease Maternal Grandmother   . Alzheimer's disease Maternal Aunt   . Arthritis Mother   . Heart disease Mother   . Heart disease Father     Social History   Tobacco Use  . Smoking status: Former Smoker    Packs/day: 0.10    Years: 30.00    Pack years: 3.00    Types: Cigarettes  . Smokeless tobacco: Never Used  Substance Use Topics  . Alcohol use: No     Subjective:  Kendra Nixon is here today for follow up of HTN. Kendra Nixon is accompanied by her daughter who is her primary caregiver and provides history today. Kendra Nixon has a history of advanced dementia, and is mostly nonverbal, in wheelchair.  At her last OV, her daughter told me that shed had some episodes of  weakness and low blood pressure, and that the weakness seemed to improve once her blood pressure BP increased, we decided to hold her losartan and continue amlodipine 10 only Kendra Nixon is back today for follow up. Her daughter has held losartan as instructed, only taking amlodipine 10 now, they have been checking her BP regularly at home since stopping losartan and readings have been good- 130s/80s.  However, daughter tells me today that the patient has been eating less and less, since yesterday will not eat at all, seems to be choking on her food and having trouble swallowing. Kendra Nixon also stayed in the bed all day yesterday,  her daughter says Kendra Nixon normally gets up into wheelchair every day, and this is the first day Kendra Nixon has not wanted to get out of the bed Daughter has also noticed new sores to patients legs, as well as more swelling in left leg than normal.  BP Readings from Last 3 Encounters:  08/07/18 140/90  03/17/18 (!) 94/58  03/16/18 136/80    ROS- See HPI  Objective:  Vitals:   09/22/18 1037  Pulse: (!) 105  SpO2: 98%  Height: 5\' 5"  (1.651 m)    General: Well developed, well nourished, ill appearing Skin : Warm and dry. Open sores to BLE. Head: Normocephalic and atraumatic  Eyes: Sclera and conjunctiva clear; pupils round and reactive to light; extraocular movements intact  Oropharynx: Pink, supple. No suspicious lesions  Neck: Supple Lungs: Effort normal,No respiratory distress. CVS exam: normal rate and regular rhythm.  Musculoskeletal: No deformities; no active joint inflammation  Extremities: No cyanosis, clubbing. Edema to LLE.  Vessels: Symmetric bilaterally  Neurologic: Kendra Nixon is alert, nonverbal.Kendra Nixon does not follow commands. In wheelchair. Psychiatric: Agitated. Abnormal memory.  Assessment:  1. Failure to thrive in adult     Plan:   Unable to obtain blood pressure reading in office, pt even became somewhat combative with CMA during BP check Kendra Nixon is not tolerating oral intake at home We have discussed plan of care, daughter seems open to palliative care but due to her not eating and failure to thrive, will send to hospital for further evaluatoin [facility]  No follow-ups on file.  No orders of the defined types were placed in this encounter.   Requested Prescriptions    No prescriptions requested or ordered in this encounter

## 2018-09-22 NOTE — Patient Instructions (Signed)
We will call the ER to let them know you are on the way.

## 2018-09-22 NOTE — ED Provider Notes (Signed)
[facility] Note   CSN: [id number] Arrival date & time: 09/22/18  1150     History   Chief Complaint Chief Complaint  Patient presents with  . Failure To Thrive    HPI Kendra Nixon is a 78 y.o. female.  HPI   78 year old female, known history of stroke, history of arthritis, history of hypertension, urinary tract infection and progressive dysfunction.  She presents in the care of her daughter who is the primary historian reporting that the patient has had a general decline over the last month including progressive weakness, progressive lack of appetite and now in the last 24 hours is not eating or drinking.  There has been no vomiting or diarrhea, no fevers or chills, no obvious coughing or shortness of breath.  They come from the family doctor's office today where because of her progressive weakness and ill appearance it was recommended that she come to the hospital for evaluation and likely admission.  Her family doctor's office is at the  The daughter has also noticed some increasing swelling to the left leg which was first seen today.  [facility] PCP  Past Medical History:  Diagnosis Date  . ACE-inhibitor cough   . AKI (acute kidney injury) (HCC) 09/2018  . Arthritis    "all over" (09/01/2016)  . CVA (cerebral vascular accident) (HCC)    Old left basal ganglion lacunar infarct noted per CT head (01/2013)  . CVA (cerebral vascular accident) (HCC) 08/31/2016   "verbal before; nonverbal now; weak all over now" (09/01/2016)  . Dementia (HCC)    Hattie Perch/notes 08/31/2016  . Hyperlipidemia LDL goal < 100   . Hypertension   . Solitary lung nodule    4 mm right upper lobe nodule  . UTI (urinary tract infection) 08/31/2016   Hattie Perch/notes 08/31/2016    Patient Active Problem List   Diagnosis Date Noted  . Goals of care, counseling/discussion   . Palliative care by specialist   . Encounter for hospice care discussion   . AKI (acute kidney  injury) (HCC) 09/22/2018  . Acute hypernatremia 09/22/2018  . Failure to thrive in adult 09/22/2018  . Pressure injury of skin 09/22/2018  . Seizure-like activity (HCC) 01/03/2017  . 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 Acute delirium 09/02/2016  . Dementia (HCC) 08/31/2016  . CVA (cerebral vascular accident) (HCC) 08/31/2016  . Osteoarthritis 12/18/2015  . Routine general medical examination at a health care facility 01/29/2015  . Medicare annual wellness visit, subsequent 01/29/2015  . Fracture, intertrochanteric, right femur (HCC) 08/15/2014  . Hyperlipidemia 08/15/2014  . Essential hypertension 08/15/2014  . Tobacco use 08/15/2014  . Femur fracture (HCC)   . PAC (premature atrial contraction) 01/30/2013  . Syncope 01/29/2013  . Sinus bradycardia 01/29/2013  . First degree heart block 01/29/2013  . Pulmonary nodule 11/04/2012    Past Surgical History:  Procedure Laterality Date  . BREAST LUMPECTOMY Bilateral 1960   "benign"  . FEMUR IM NAIL Right 08/16/2014   Procedure: INTRAMEDULLARY (IM) NAIL FEMORAL;  Surgeon: Sheral Apleyimothy D Murphy, MD;  Location: [facility] OR;  Service: Orthopedics;  Laterality: Right;  . FRACTURE SURGERY       OB History   No obstetric history on file.      Home Medications    Prior to Admission medications   Medication Sig Start Date End Date Taking? Authorizing Provider  amLODipine (NORVASC) 10 MG tablet TAKE 1 TABLET BY MOUTH ONCE DAILY 07/10/18  Yes Evaristo BuryShambley, [name], NP  aspirin 325 MG tablet Take 1 tablet (325 mg total) by mouth daily. 09/03/16  Yes Dhungel, Nishant, MD  atorvastatin (LIPITOR) 80 MG tablet Take 1 tablet (80 mg total) by mouth every other day. 08/07/18  Yes Evaristo Bury, NP  divalproex (DEPAKOTE ER) 250 MG 24 hr tablet Take 2 tablets every evening 07/25/18  Yes Van Clines, MD  Menthol-Methyl Salicylate (MUSCLE RUB) 10-15 % CREA Apply 1 application topically as needed for muscle pain (shoulder pain).   Yes [provider]  Multiple Vitamin (MULTIVITAMIN) tablet Take 1 tablet by mouth daily.   Yes [provider]  QUEtiapine (SEROQUEL) 25 MG tablet Take 1/2 tablet in AM, then 1.5 tablets every night 03/17/18  Yes Van Clines, MD    Family History Family History  Problem Relation Age of Onset  . Breast cancer Sister 28  . Hypertension Other   . Alzheimer's disease Maternal Grandmother   . Alzheimer's disease Maternal Aunt   . Arthritis Mother   . Heart disease Mother   . Heart disease Father     Social History Social History   Tobacco Use  . Smoking status: Former Smoker    Packs/day: 0.10    Years: 30.00    Pack years: 3.00    Types: Cigarettes  . Smokeless tobacco: Never Used  Substance Use Topics  . Alcohol use: No  . Drug use: No     Allergies   Patient has no known allergies.   Review of Systems Review of Systems  Unable to perform ROS: Dementia     Physical Exam Updated Vital Signs BP 119/70 (BP Location: Left Arm)   Pulse 72   Temp 98 F (36.7 C) (Oral)   Resp 18   Ht 1.651 m (5\' 5" )   Wt 54.5 kg   SpO2 98%   BMI 19.99 kg/m   Physical Exam Vitals signs and nursing note reviewed.  Constitutional:      General: She is not in acute distress.    Appearance: She is well-developed. She is ill-appearing.  HENT:     Head: Normocephalic and atraumatic.     Mouth/Throat:     Pharynx: No oropharyngeal exudate.  Eyes:     General: No scleral icterus.       Right eye: No discharge.        Left eye: No discharge.     Conjunctiva/sclera: Conjunctivae normal.     Pupils: Pupils are equal, round, and reactive to light.  Neck:     Musculoskeletal: Normal range of motion and neck supple.     Thyroid: No thyromegaly.     Vascular: No JVD.  Cardiovascular:     Rate and Rhythm: Normal rate and regular rhythm.     Heart sounds: Normal heart sounds. No murmur. No friction rub. No gallop.   Pulmonary:     Effort: Pulmonary effort is normal. No  respiratory distress.     Breath sounds: Normal breath sounds. No wheezing or rales.  Abdominal:     General: Bowel sounds are normal. There is no distension.     Palpations: Abdomen is soft. There is no mass.     Tenderness: There is no abdominal tenderness.  Musculoskeletal: Normal range of motion.        General: No tenderness.     Left lower leg: Edema present.  Lymphadenopathy:     Cervical: No cervical adenopathy.  Skin:    General: Skin is warm and dry.  Findings: No erythema or rash.  Neurological:     Mental Status: She is alert.     Comments: The patient is nonverbal, she is grunting and groaning at me, she will move all 4 extremities, she withdraws from painful stimuli, there is no obvious facial droop  Psychiatric:        Behavior: Behavior normal.      ED Treatments / Results  Labs (all labs ordered are listed, but only abnormal results are displayed) Labs Reviewed  COMPREHENSIVE METABOLIC PANEL - Abnormal; Notable for the following components:      Result Value   Sodium 151 (*)    Chloride 114 (*)    CO2 18 (*)    Glucose, Bld 119 (*)    BUN 25 (*)    Creatinine, Ser 1.38 (*)    Albumin 2.9 (*)    AST 63 (*)    GFR calc non Af Amer 36 (*)    GFR calc Af Amer 42 (*)    Anion gap 19 (*)    All other components within normal limits  CBC WITH DIFFERENTIAL/PLATELET - Abnormal; Notable for the following components:   WBC 11.6 (*)    RDW 15.6 (*)    nRBC 0.3 (*)    Neutro Abs 8.8 (*)    Abs Immature Granulocytes 0.08 (*)    All other components within normal limits  URINALYSIS, COMPLETE (UACMP) WITH MICROSCOPIC - Abnormal; Notable for the following components:   Color, Urine AMBER (*)    APPearance HAZY (*)    Ketones, ur 20 (*)    Protein, ur 30 (*)    Bacteria, UA MANY (*)    All other components within normal limits  VALPROIC ACID LEVEL - Abnormal; Notable for the following components:   Valproic Acid Lvl 21 (*)    All other components within  normal limits  AMMONIA - Abnormal; Notable for the following components:   Ammonia 38 (*)    All other components within normal limits  BASIC METABOLIC PANEL - Abnormal; Notable for the following components:   Sodium 149 (*)    Chloride 112 (*)    Glucose, Bld 153 (*)    BUN 24 (*)    GFR calc non Af Amer 54 (*)    All other components within normal limits  CBC - Abnormal; Notable for the following components:   Platelets 136 (*)    All other components within normal limits  CREATININE, SERUM - Abnormal; Notable for the following components:   Creatinine, Ser 1.02 (*)    GFR calc non Af Amer 52 (*)    All other components within normal limits  HEPARIN LEVEL (UNFRACTIONATED) - Abnormal; Notable for the following components:   Heparin Unfractionated 1.40 (*)    All other components within normal limits  CBG MONITORING, ED - Abnormal; Notable for the following components:   Glucose-Capillary 106 (*)    All other components within normal limits  URINE CULTURE  URINE CULTURE  CK  TSH  CBC  HEPARIN LEVEL (UNFRACTIONATED)    EKG None  Radiology Ct Head Wo Contrast  Result Date: 09/22/2018 CLINICAL DATA:  78 year old female with encephalopathy. EXAM: CT HEAD WITHOUT CONTRAST TECHNIQUE: Contiguous axial images were obtained from the base of the skull through the vertex without intravenous contrast. COMPARISON:  Brain MRI dated 09/01/2016 and CT dated 08/31/2016 FINDINGS: Brain: There is moderate age-related atrophy and chronic microvascular ischemic changes. There is no acute intracranial hemorrhage. No mass effect  or midline shift. Old left occipital infarct. Vascular: No hyperdense vessel or unexpected calcification. Skull: Normal. Negative for fracture or focal lesion. Sinuses/Orbits: No acute finding. Other: None IMPRESSION: 1. No acute intracranial hemorrhage. 2. Moderate age-related atrophy and chronic microvascular ischemic changes. Old left occipital infarct. Electronically  Signed   By: Elgie CollardArash  Radparvar M.D.   On: 09/22/2018 21:23   Dg Chest Port 1 View  Result Date: 09/22/2018 CLINICAL DATA:  Aspiration. EXAM: PORTABLE CHEST 1 VIEW COMPARISON:  Radiographs of August 31, 2016. FINDINGS: No pneumothorax or pleural effusion is noted. Stable cardiomediastinal silhouette. Both lungs are clear. The visualized skeletal structures are unremarkable. IMPRESSION: No acute cardiopulmonary abnormality seen. Electronically Signed   By: Lupita RaiderJames  Green Jr, M.D.   On: 09/22/2018 13:17   Vas [place] Lower Extremity Venous (dvt) (only [facility])  Result Date: 09/22/2018  Lower Venous Study Indications: Swelling, and Edema.  Limitations: Extremely contracted body with stiffness, combativeness. Comparison Study: lower extremity venous duplex exam on 03/21/2018 Performing Technologist: Melodie BouillonSelina Cole  Examination Guidelines: A complete evaluation includes B-mode imaging, spectral Doppler, color Doppler, and power Doppler as needed of all accessible portions of each vessel. Bilateral testing is considered an integral part of a complete examination. Limited examinations for reoccurring indications may be performed as noted.  Right Venous Findings: +---+---------------+---------+-----------+----------+-------------------------+    CompressibilityPhasicitySpontaneityPropertiesSummary                   +---+---------------+---------+-----------+----------+-------------------------+ CFV                                             unable to obtain due to                                                   extremely contracted                                                      body, patient refused to                                                  coorperate                +---+---------------+---------+-----------+----------+-------------------------+  Left Venous Findings: +---------+---------------+---------+-----------+----------+-------------------+           CompressibilityPhasicitySpontaneityPropertiesSummary             +---------+---------------+---------+-----------+----------+-------------------+ CFV      None                    No                   Chronic             +---------+---------------+---------+-----------+----------+-------------------+ SFJ  Not visualized      +---------+---------------+---------+-----------+----------+-------------------+ FV Prox                                               unable to obtain                                                          due to extremely                                                          contracted body,                                                          patient refused to                                                        coorperate          +---------+---------------+---------+-----------+----------+-------------------+ FV Mid                                                unable to obtain                                                          due to extremely                                                          contracted body,                                                          patient refused to                                                        coorperate          +---------+---------------+---------+-----------+----------+-------------------+  FV Distal                                             unable to obtain                                                          due to extremely                                                          contracted body,                                                          patient refused to                                                        coorperate          +---------+---------------+---------+-----------+----------+-------------------+ PFV                                                    Not visualized      +---------+---------------+---------+-----------+----------+-------------------+ POP      None           No       No                   Acute               +---------+---------------+---------+-----------+----------+-------------------+ PTV                                                   Not visualized      +---------+---------------+---------+-----------+----------+-------------------+ PERO                                                  Not visualized      +---------+---------------+---------+-----------+----------+-------------------+    Summary: Left: Findings consistent with acute deep vein thrombosis involving the left popliteal vein. Findings consistent with chronic deep vein thrombosis involving the left femoral vein.  *See table(s) above for measurements and observations.    Preliminary     Procedures Procedures (including critical care time)  Medications Ordered in ED Medications  aspirin tablet 325 mg (325 mg Oral Given 09/23/18 1021)  atorvastatin (  LIPITOR) tablet 80 mg (80 mg Oral Given 09/22/18 2215)  QUEtiapine (SEROQUEL) tablet 25 mg (25 mg Oral Given 09/22/18 2215)  QUEtiapine (SEROQUEL) tablet 12.5 mg (12.5 mg Oral Given 09/23/18 1021)  multivitamin with minerals tablet 1 tablet (1 tablet Oral Given 09/23/18 1021)  acetaminophen (TYLENOL) tablet 650 mg (has no administration in time range)    Or  acetaminophen (TYLENOL) suppository 650 mg (has no administration in time range)  ondansetron (ZOFRAN) tablet 4 mg (has no administration in time range)    Or  ondansetron (ZOFRAN) injection 4 mg (has no administration in time range)  dextrose 5 %-0.45 % sodium chloride infusion ( Intravenous Not Given 09/22/18 1813)  cefTRIAXone (ROCEPHIN) 1 g in sodium chloride 0.9 % 100 mL IVPB (1 g Intravenous New Bag/Given 09/22/18 1841)  divalproex (DEPAKOTE ER) 24 hr tablet 500 mg (500 mg Oral Given 09/23/18 1021)    feeding supplement (ENSURE ENLIVE) (ENSURE ENLIVE) liquid 237 mL (237 mLs Oral Not Given 09/23/18 1506)  heparin ADULT infusion 100 units/mL (25000 units/239mL sodium chloride 0.45%) (650 Units/hr Intravenous New Bag/Given 09/23/18 0731)  heparin bolus via infusion 1,500 Units (1,500 Units Intravenous Bolus from Bag 09/22/18 2216)     Initial Impression / Assessment and Plan / ED Course  I have reviewed the triage vital signs and the nursing notes.  Pertinent labs & imaging results that were available during my care of the patient were reviewed by me and considered in my medical decision making (see chart for details).  Clinical Course as of Sep 23 1542  Fri Sep 22, 2018  1356 Patient is dehydrated with some hypernatremia, hyperchloremia and acute kidney injury with a creatinine of 1.38.  White blood cell count is 11,600, urinalysis yet to be collected   [BM]    Clinical Course User Index [BM] Eber Hong, MD    The patient has what appears to be advanced dementia with some progressive illness over the last month but her vital signs are rather unremarkable.  I am concerned about the swollen leg especially since this is her leg that is usually on top when she lays on her right side down.  She has no tachycardia tachypnea or hypoxia.  We will look for possible aspiration, renal dysfunction, infection etc.  She will likely need to be admitted to the hospital.  I did have a discussion with the family about what they wanted, they said they wanted her to prolong her life as long as possible.  I told him I did not think it was advisable to place her on life support or CPR and that this would be a mistake to which the daughter who is here said okay.  As far as providing supportive care for nutrition they are unclear about putting a G-tube in.  D/w hospitalist Dr. Isidoro Donning - who will admit  Final Clinical Impressions(s) / ED Diagnoses   Final diagnoses:  DVT of deep femoral vein, left (HCC)   Dehydration  Hypernatremia      Eber Hong, MD 09/23/18 1545

## 2018-09-22 NOTE — H&P (Addendum)
History and Physical        Hospital Admission Note Date: 09/22/2018  Patient name: Kendra Nixon Medical record number: [id number] Date of birth: 22-Sep-1940 Age: 78 y.o. Gender: female  PCP: Evaristo BuryShambley, [name], NP    Patient coming from: PCP's office   I have reviewed all [facility] the [facility].    Chief Complaint:  Failure to thrive with hypotension  HPI: Patient is a 78 year old female with history of CVA with right-sided weakness, dementia, hypertension hyperlipidemia was seen at her PCPs office today.  Patient lives at home with her daughter who has been taking care of her.  Today at PCPs office, they could not record her BP and patient also became combative.  Patient has dementia and unable to provide any history.  Daughter at the bedside states that patient has been declining over the past month.  6 weeks ago, she was able to walk some and now she is in wheelchair.  She has been eating less and less in the last 2 to 3 weeks and was unable to take anything oral in last 2 days.  She has also been more lethargic and yesterday mostly slept.  Patient has also developed skin scores on the left lower extremity. Daughter reports no nausea vomiting any fever chills or diarrhea.  She also reported waxing and waning mental status changes with confusion and agitation  ED work-up/course:  In ED, temp 97.7, respiratory rate 20, pulse 103, initial BP 93/74 T1, potassium 4.3, chloride 114, CO2 18, BUN 25, creatinine 1.3, calcium 9.8 Anion gap 19, albumin 2.9 AST 63 WBC 7.6, hemoglobin 13.2   Review of Systems: Positives marked in 'bold' Patient is unable to provide any review of system due to altered mental status and worsening on dementia   Past Medical History: Past Medical History:  Diagnosis Date  . ACE-inhibitor cough   . Arthritis    "all over" (09/01/2016)    . CVA (cerebral vascular accident) (HCC)    Old left basal ganglion lacunar infarct noted per CT head (01/2013)  . CVA (cerebral vascular accident) (HCC) 08/31/2016   "verbal before; nonverbal now; weak all over now" (09/01/2016)  . Dementia (HCC)    Hattie Perch/notes 08/31/2016  . Hyperlipidemia LDL goal < 100   . Hypertension   . Solitary lung nodule    4 mm right upper lobe nodule  . UTI (urinary tract infection) 08/31/2016   Hattie Perch/notes 08/31/2016    Past Surgical History:  Procedure Laterality Date  . BREAST LUMPECTOMY Bilateral 1960   "benign"  . FEMUR IM NAIL Right 08/16/2014   Procedure: INTRAMEDULLARY (IM) NAIL FEMORAL;  Surgeon: Sheral Apleyimothy D Murphy, MD;  Location: [facility] OR;  Service: Orthopedics;  Laterality: Right;  . FRACTURE SURGERY      Medications: Prior to Admission medications   Medication Sig Start Date End Date Taking? Authorizing Provider  amLODipine (NORVASC) 10 MG tablet TAKE 1 TABLET BY MOUTH ONCE DAILY 07/10/18  Yes Evaristo BuryShambley, [name], NP  aspirin 325 MG tablet Take 1 tablet (325 mg total) by mouth daily. 09/03/16  Yes Dhungel, Nishant, MD  atorvastatin (LIPITOR) 80 MG tablet Take 1 tablet (80 mg total)  by mouth every other day. 08/07/18  Yes Evaristo Bury, NP  divalproex (DEPAKOTE ER) 250 MG 24 hr tablet Take 2 tablets every evening 07/25/18  Yes Van Clines, MD  Menthol-Methyl Salicylate (MUSCLE RUB) 10-15 % CREA Apply 1 application topically as needed for muscle pain (shoulder pain).   Yes [provider]  Multiple Vitamin (MULTIVITAMIN) tablet Take 1 tablet by mouth daily.   Yes [provider]  QUEtiapine (SEROQUEL) 25 MG tablet Take 1/2 tablet in AM, then 1.5 tablets every night 03/17/18  Yes Van Clines, MD    Allergies:  No Known Allergies  Social History: Per chart review,  that she has quit smoking. Her smoking use included cigarettes. She has a 3.00 pack-year smoking history. She has never used smokeless tobacco. She reports that  she does not drink alcohol or use drugs.  Family History: Family History  Problem Relation Age of Onset  . Breast cancer Sister 65  . Hypertension Other   . Alzheimer's disease Maternal Grandmother   . Alzheimer's disease Maternal Aunt   . Arthritis Mother   . Heart disease Mother   . Heart disease Father     Physical Exam: Blood pressure 108/79, pulse 90, temperature 97.7 F (36.5 C), temperature source Rectal, resp. rate 18, SpO2 100 %. General: Alert, awake, confused, at times yelling Eyes: pink conjunctiva,anicteric sclera, pupils equal and reactive to light and accomodation, HEENT: normocephalic, atraumatic, oropharynx clear Neck: supple, no masses or lymphadenopathy, no JVD CVS: Regular rate and rhythm, No lower extremity edema Resp : Decreased breath sound at the bases GI : Soft, nontender, nondistended, positive bowel sounds Musculoskeletal: No clubbing or cyanosis, positive pedal pulses. No contracture. ROM intact  Neuro: Does not cooperate Psych: Dementia, confused Skin: Wound on the left lower extremity/shin  LABS on Admission: I have personally reviewed all the labs and imagings below    Basic Metabolic Panel: Recent Labs  Lab 09/22/18 1241  NA 151*  K 4.3  CL 114*  CO2 18*  GLUCOSE 119*  BUN 25*  CREATININE 1.38*  CALCIUM 9.8   Liver Function Tests: Recent Labs  Lab 09/22/18 1241  AST 63*  ALT 26  ALKPHOS 71  BILITOT 1.2  PROT 7.5  ALBUMIN 2.9*   No results for input(s): LIPASE, AMYLASE in the last 168 hours. Recent Labs  Lab 09/22/18 1607  AMMONIA 38*   CBC: Recent Labs  Lab 09/22/18 1241  WBC 11.6*  NEUTROABS 8.8*  HGB 13.2  HCT 43.9  MCV 95.0  PLT 170   Cardiac Enzymes: No results for input(s): CKTOTAL, CKMB, CKMBINDEX, TROPONINI in the last 168 hours. BNP: Invalid input(s): POCBNP CBG: Recent Labs  Lab 09/22/18 1243  GLUCAP 106*    Radiological Exams on Admission:  Dg Chest Port 1 View  Result Date:  09/22/2018 CLINICAL DATA:  Aspiration. EXAM: PORTABLE CHEST 1 VIEW COMPARISON:  Radiographs of August 31, 2016. FINDINGS: No pneumothorax or pleural effusion is noted. Stable cardiomediastinal silhouette. Both lungs are clear. The visualized skeletal structures are unremarkable. IMPRESSION: No acute cardiopulmonary abnormality seen. Electronically Signed   By: Lupita Raider, M.D.   On: 09/22/2018 13:17      EKG: Independently reviewed.  None available  Assessment/Plan Principal Problem: Hypernatremia with AKI (acute kidney injury) (HCC), anion gap metabolic acidosis: Likely due to poor oral intake in the last 2 to 3 weeks -Patient has received normal saline in ED, will transition to D5 half-normal saline at 100 cc an  hour -Placed on dysphagia 1 diet, SLP evaluation -Discussed in detail with the patient's daughter at the bedside regarding worsening dementia, failure to thrive.  Palliative care consult placed for goals of care. -Obtain TSH, CK.  Ammonia level slightly elevated at 38.  Active Problems:   Hyperlipidemia -Check CK, continue statin    Essential hypertension -BP currently soft, continue IV fluid hydration, hold amlodipine  Acute metabolic encephalopathy with dementia (HCC) with failure to thrive -UA with ketones, many bacteria, however no leukocytes nitrites, will follow urine culture,  -Given worsening mental status, leukocytosis, placed on IV Rocephin, DC antibiotics if urine culture negative -For now continue Seroquel - given mental status changes, swallowing issues, and prior history of stroke, will check CT head  History of CVA (cerebral vascular accident) (HCC) with right-sided weakness, wheelchair-bound -Continue aspirin, statin  History of seizures -Continue Depakote  Left lower leg wound -Follow Doppler ultrasound of the lower extremity to rule out DVT, has been bedbound -Continue wound care on the left lower leg Addendum:  LLE duplex showed ac DVT in  popliteal vein, started on heparin drip.    DVT prophylaxis: Heparin subcu  CODE STATUS: Discussed in detail with the patient's daughter at the bedside, patient has advanced dementia, failure to thrive, poor oral intake.  Discussed dementia trajectory and the stages.  Daughter wants FULL CODE STATUS at this time and will discuss with the rest of the family about goals of care  Consults called: None  Family Communication: Admission, patients condition and plan of care including tests being ordered have been discussed with the patient's daughter who indicates understanding and agree with the plan and Code Status  Admission status: Inpatient  Disposition plan: Further plan will depend as patient's clinical course evolves and further radiologic and laboratory data become available.    At the time of admission, it appears that the appropriate admission status for this patient is INPATIENT . This is judged to be reasonable and necessary in order to provide the required intensity of service to ensure the patient's safety given the presenting symptoms of profound dehydration, hyponatremia, acute kidney injury, physical exam findings, and initial radiographic and laboratory data in the context of their chronic comorbidities.  The medical decision making on this patient was of high complexity and the patient is at high risk for clinical deterioration, therefore this is a level 3 visit.   Time Spent on Admission: 70 minutes    Zavia Pullen M.D. [facility] 09/22/2018, 5:04 PM Pager: [phone]  If 7PM-7AM, please contact night-coverage [email or web address] Password [email or web address]

## 2018-09-22 NOTE — ED Notes (Signed)
Tried to draw blood from pt. Pt snatching hand away and hitting at me. Unable to draw blood x 2 attempts

## 2018-09-22 NOTE — Progress Notes (Signed)
MD recommended CT head without contrast to rule out brain bleeding and after the CT result if it is appropriate, start Heparin drip. Pharmacy was called and informed. Will continue to monitor.

## 2018-09-22 NOTE — Progress Notes (Signed)
ANTICOAGULATION CONSULT NOTE - Initial Consult  Pharmacy Consult for Heparin Indication: LLE DVT  No Known Allergies  Patient Measurements: Height: 5\' 5"  (165.1 cm) Weight: 120 lb 1.6 oz (54.5 kg) IBW/kg (Calculated) : 57 Heparin Dosing Weight: 54.5 kg  Vital Signs: Temp: 98.7 F (37.1 C) (01/17 1731) Temp Source: Oral (01/17 1731) BP: 106/82 (01/17 1731) Pulse Rate: 83 (01/17 1731)  Labs: Recent Labs    09/22/18 1241  HGB 13.2  HCT 43.9  PLT 170  CREATININE 1.38*    Estimated Creatinine Clearance: 28.4 mL/min (A) (by C-G formula based on SCr of 1.38 mg/dL (H)).   Medical History: Past Medical History:  Diagnosis Date  . ACE-inhibitor cough   . AKI (acute kidney injury) (HCC) 09/2018  . Arthritis    "all over" (09/01/2016)  . CVA (cerebral vascular accident) (HCC)    Old left basal ganglion lacunar infarct noted per CT head (01/2013)  . CVA (cerebral vascular accident) (HCC) 08/31/2016   "verbal before; nonverbal now; weak all over now" (09/01/2016)  . Dementia (HCC)    Hattie Perch/notes 08/31/2016  . Hyperlipidemia LDL goal < 100   . Hypertension   . Solitary lung nodule    4 mm right upper lobe nodule  . UTI (urinary tract infection) 08/31/2016   Hattie Perch/notes 08/31/2016    Medications:  Medications Prior to Admission  Medication Sig Dispense Refill Last Dose  . amLODipine (NORVASC) 10 MG tablet TAKE 1 TABLET BY MOUTH ONCE DAILY 90 tablet 0 09/21/2018 at am  . aspirin 325 MG tablet Take 1 tablet (325 mg total) by mouth daily. 30 tablet 0 09/21/2018 at am  . atorvastatin (LIPITOR) 80 MG tablet Take 1 tablet (80 mg total) by mouth every other day. 90 tablet 0 09/21/2018 at Unknown time  . divalproex (DEPAKOTE ER) 250 MG 24 hr tablet Take 2 tablets every evening 180 tablet 3 09/21/2018 at Unknown time  . Menthol-Methyl Salicylate (MUSCLE RUB) 10-15 % CREA Apply 1 application topically as needed for muscle pain (shoulder pain).   Past Week at Unknown time  . Multiple Vitamin  (MULTIVITAMIN) tablet Take 1 tablet by mouth daily.   09/21/2018 at Unknown time  . QUEtiapine (SEROQUEL) 25 MG tablet Take 1/2 tablet in AM, then 1.5 tablets every night 180 tablet 3 09/21/2018 at Unknown time   Infusions:  . cefTRIAXone (ROCEPHIN)  IV 1 g (09/22/18 1841)  . dextrose 5 % and 0.45% NaCl      Assessment: [age] YOF who presented on 1/17 with FTT and hypotension. Noted advancing dementia and now in wheelchair. Dopplers this evening showed an acute LLE DVT in the popliteal vein. Pharmacy consulted to start Heparin for anticoagulation.  Originally, the consult asked to hold Heparin initiation until after the head CT ruled out bleed however now the consult has been re-entered to go ahead and start Heparin. Heparin dosing weight: 54.5 kg. Given the initial concerns for a head CT, will only give a lower bolus with initiation.   Goal of Therapy:  Heparin level 0.3-0.7 units/ml Monitor platelets by anticoagulation protocol: Yes   Plan:  - Heparin 1500 unit bolus x 1 - Start Heparin at 850 units/hr (8.5 ml/hr) - Will continue to monitor for any signs/symptoms of bleeding and will follow up with heparin level in 8 hours   Thank you for allowing pharmacy to be a part of this patient's care.  Georgina PillionElizabeth Shante Archambeault, PharmD, BCPS Clinical Pharmacist Clinical phone for 09/22/2018: [phone] 09/22/2018 7:09 PM   **Pharmacist  phone directory can now be found on [email or web address] (PW [email or web address]).  Listed under [facility].

## 2018-09-23 ENCOUNTER — Encounter (HOSPITAL_COMMUNITY): Payer: Self-pay | Admitting: Primary Care

## 2018-09-23 DIAGNOSIS — Z7189 Other specified counseling: Secondary | ICD-10-CM

## 2018-09-23 DIAGNOSIS — Z515 Encounter for palliative care: Secondary | ICD-10-CM

## 2018-09-23 DIAGNOSIS — R627 Adult failure to thrive: Secondary | ICD-10-CM

## 2018-09-23 LAB — BASIC METABOLIC PANEL
Anion gap: 14 (ref 5–15)
BUN: 24 mg/dL — ABNORMAL HIGH (ref 8–23)
CHLORIDE: 112 mmol/L — AB (ref 98–111)
CO2: 23 mmol/L (ref 22–32)
Calcium: 9.2 mg/dL (ref 8.9–10.3)
Creatinine, Ser: 1 mg/dL (ref 0.44–1.00)
GFR calc Af Amer: >60 mL/min (ref >60)
GFR calc non Af Amer: 54 mL/min — ABNORMAL LOW (ref >60)
Glucose, Bld: 153 mg/dL — ABNORMAL HIGH (ref 70–99)
Potassium: 3.9 mmol/L (ref 3.5–5.1)
Sodium: 149 mmol/L — ABNORMAL HIGH (ref 135–145)

## 2018-09-23 LAB — CBC
HEMATOCRIT: 37.8 % (ref 36.0–46.0)
Hemoglobin: 12.4 g/dL (ref 12.0–15.0)
MCH: 29 pg (ref 26.0–34.0)
MCHC: 32.8 g/dL (ref 30.0–36.0)
MCV: 88.3 fL (ref 80.0–100.0)
Platelets: 136 10*3/uL — ABNORMAL LOW (ref 150–400)
RBC: 4.28 MIL/uL (ref 3.87–5.11)
RDW: 15.5 % (ref 11.5–15.5)
WBC: 10.2 10*3/uL (ref 4.0–10.5)
nRBC: 0.2 % (ref 0.0–0.2)

## 2018-09-23 LAB — URINE CULTURE: CULTURE: NO GROWTH

## 2018-09-23 LAB — HEPARIN LEVEL (UNFRACTIONATED)
HEPARIN UNFRACTIONATED: 1.4 [IU]/mL — AB (ref 0.30–0.70)
Heparin Unfractionated: 0.67 IU/mL (ref 0.30–0.70)

## 2018-09-23 MED ORDER — HEPARIN (PORCINE) 25000 UT/250ML-% IV SOLN: 650.0000 [IU]/h | INTRAVENOUS | Status: DC

## 2018-09-23 MED ORDER — HEPARIN (PORCINE) 25000 UT/250ML-% IV SOLN
550.0000 [IU]/h | INTRAVENOUS | Status: DC
  Administered 2018-09-23: 650 [IU]/h via INTRAVENOUS
  Filled 2018-09-23: qty 250

## 2018-09-23 NOTE — Progress Notes (Signed)
Initial Nutrition Assessment  DOCUMENTATION CODES:   Not applicable  INTERVENTION:   - Continue Ensure Enlive po BID, each supplement provides 350 kcal and 20 grams of protein  - Continue MVI with minerals daily  Will follow for continued GOC discussions.  NUTRITION DIAGNOSIS:   Inadequate oral intake related to lethargy/confusion, dysphagia, chronic illness (dementia) as evidenced by per patient/family report, meal completion < 25%.  GOAL:   Patient will meet greater than or equal to 90% of their needs  MONITOR:   PO intake, Supplement acceptance, Weight trends, Other (GOC), Labs, Skin  REASON FOR ASSESSMENT:   Consult Assessment of nutrition requirement/status  ASSESSMENT:   78 year old female who presented to the ED on 1/17 with FTT and hypotension. PMH significant for CVA and right-sided weakness, dementia, HTN, HLD. Pt admitted with AKI.  Discussed pt with RN. Per RN, pt's daughter fed pt a few bites of applesauce and pt took 2 sips of Ensure this morning but has otherwise been refusing to eat. SLP saw pt this morning and recommended D1 with thin liquids.  Noted palliative care team following. Per palliative note today, "continue to treat the treatable but no chest compressions or defibrillation." Pt is a limited code.  Spoke with pt's son briefly at bedside. Pt agitated and restless. Multiple family members present.  Pt's family confirms that pt did not accept any food from lunch tray and has only had a few sips of Ensure supplement. Pt's family also confirms pt has been declining over the last several weeks and that PO intake has decreased. Per H&P, pt eating "less and last in the last 2 to 3 weeks and was unable to take anything oral in the last 2 days PTA. Pt has also been more lethargic and sleeping more."  Noted virtually untouched lunch meal tray at bedside.  Per weight history in chart, weight appears fairly stable over the last 2.5 years. Noted recent weight  los of 4.5 kg in 1.5 months. This is a 7.6% weight loss which is significant for timeframe.  Will continue with current plan at this time and monitor for GOC.  Medications reviewed and include: Ensure Enlive BID, MVI with minerals, IV antibiotics, IV heparin  Labs reviewed: sodium 149 (H), BUN 24 (H) CBG: 106  NUTRITION - FOCUSED PHYSICAL EXAM:  Deferred. Pt agitated, multiple family members in room.  Diet Order:   Diet Order            DIET - DYS 1 Room service appropriate? Yes; Fluid consistency: Thin  Diet effective now              EDUCATION NEEDS:   No education needs have been identified at this time  Skin:  Skin Assessment:  Skin Integrity Issues: DTI: right ankle Stage II: right hip, sacrum Stage III: ischial tuberostiy Other: non-pressure wound to left leg  Last BM:  PTA  Height:   Ht Readings from Last 1 Encounters:  09/22/18 5\' 5"  (1.651 m)    Weight:   Wt Readings from Last 1 Encounters:  09/22/18 54.5 kg    Ideal Body Weight:  56.8 kg  BMI:  Body mass index is 19.99 kg/m.  Estimated Nutritional Needs:   Kcal:  1250-1450  Protein:  60-70 grams  Fluid:  1.3-1.5 L    Earma Reading, MS, RD, LDN Inpatient Clinical Dietitian Pager: [phone] Weekend/After Hours: [phone]

## 2018-09-23 NOTE — Progress Notes (Signed)
Patient restless, pulled off her IV line where Heparin was running; patient was off of Heparin for 15 minutes; pharmacy made aware; IV team paged for a new IV line. Family at bedside.  Will continue to monitor.

## 2018-09-23 NOTE — Progress Notes (Signed)
ANTICOAGULATION CONSULT NOTE   Pharmacy Consult for Heparin Indication: LLE DVT  No Known Allergies  Patient Measurements: Height: 5\' 5"  (165.1 cm) Weight: 120 lb 1.6 oz (54.5 kg) IBW/kg (Calculated) : 57 Heparin Dosing Weight: 54.5 kg  Vital Signs: Temp: 98 F (36.7 C) (01/18 1502) Temp Source: Oral (01/18 1502) BP: 119/70 (01/18 1502) Pulse Rate: 72 (01/18 1502)  Labs: Recent Labs    09/22/18 1241 09/22/18 1852 09/23/18 0508 09/23/18 1607  HGB 13.2 12.4 12.4  --   HCT 43.9 38.8 37.8  --   PLT 170 160 136*  --   HEPARINUNFRC  --   --  1.40* 0.67  CREATININE 1.38* 1.02* 1.00  --   CKTOTAL  --  194  --   --    Estimated Creatinine Clearance: 39.2 mL/min (by C-G formula based on SCr of 1 mg/dL).  Assessment: Kendra Nixon who presented on 1/17 with FTT and hypotension. Noted advancing dementia and now in wheelchair. Dopplers showed an acute LLE DVT in the popliteal vein. Pharmacy consulted to start IV heparin for anticoagulation. Heparin level now within goal range at 0.67 following 1 hour hold and rate decrease this AM. Hgb stable 12.4, pltc 136. No bleeding noted.  Goal of Therapy:  Heparin level 0.3-0.7 units/ml Monitor platelets by anticoagulation protocol: Yes   Plan:  Continue heparin gtt at 650 units/hr Recheck heparin level with AM labs Daily heparin level and CBC Monitor s/sx of bleeding  Erin N. Zigmund Daniel, PharmD, BCPS PGY2 Infectious Diseases Pharmacy Resident Phone: [phone] 09/23/18   4:49 PM

## 2018-09-23 NOTE — Progress Notes (Signed)
Per pharmacy, Heparin drip can run together with Dextrose 5% and Rocephin. Will continue to monitor.

## 2018-09-23 NOTE — Consult Note (Addendum)
Consultation Note Date: 09/23/2018   Patient Name: Kendra Nixon  DOB: July 29, 1941  MRN: [id number]  Age / Sex: 78 y.o., female  PCP: Evaristo Bury, NP Referring Physician: Cathren Harsh, MD  Reason for Consultation: Establishing goals of care and Hospice Evaluation  HPI/Patient Profile: 78 y.o. 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  Palliative medicine consulted for goals of care..   Clinical Assessment and Goals of Care: Kendra Nixon is lying quietly in bed.  She appears acutely/chronically ill and frail.  She does not make eye contact or try to communicate her basic needs.  When I speak to her she becomes more more agitated.  Present today at bedside his daughter Kendra Nixon.  I ask Kendra Nixon what she understands about her mother's illness.  She shares that she has spoken with the doctor and the next couple of days, 24 to 48 hours, will show whether Kendra Nixon is able to recover.  We talked about acute DVT and treatment plan.  I share that currently Kendra Nixon is receiving IV heparin, but she would be expected to transition to either by mouth pills (I share my concern about her ability and desire to participate in pill taking) or painful injections of blood thinner.  Kendra Nixon states that at times Kendra Nixon declines to take medications.  Kendra Nixon and I talked about Kendra Nixon's decline over the last 4 to 6 weeks.  She is eating less, and no longer walking.  We talked about what is normal as people near end-of-life including eating less Kendra Nixon tells me that she is familiar with in-home hospice services, her grandmother and aunt experienced in-home hospice.  We talked  about what is and is not provided including no IV fluids, medications and treatments as long as Kendra Nixon is willing and able to participate.  We talked about healthcare power of attorney, see below.  We talked about CODE STATUS, see below.  We talked about how to make choices for loved ones including 1) keeping them at the center of decision-making, 2) are we doing something for her or to her, can we change what is happening, 3) what would Kendra Nixon of 10 years ago tell us about how to care for Kendra Nixon now.  Kendra Nixon states that Kendra Nixon had said she would not want life support.  Kendra Nixon shares that she and her brother Kendra Nixon would like to allow a natural death, but their brother Kendra Nixon, would not agree at this point.  She shares that if Mrs. Kazemi needs intubation, they would agree at least for a couple of days to allow Kendra Nixon to see that this was not good for their mother.  Kendra Nixon shares that Kendra Nixon had stated that she would not want life support, No CPR, No defibrillation, but Yes to ACLS drugs and elective  intubation.   I share a diagram of the chronic illness pathway, what is normal and expected.  I encourage Kendra BattiestVeronica to consider how we will care for Kendra Nixon WHEN she become sick again.  Healthcare power of attorney HCPOA -daughter, Kendra GilfordVeronica Nixon shares that she is legal healthcare power of attorney.  She has 2 brothers Kendra [place] who lives in [place] who is in the [employer].  She shares that they make choices together.   SUMMARY OF RECOMMENDATIONS   24 to 48 hours for outcomes Kendra BattiestVeronica is open to home with hospice if Kendra Nixon does not improve.  Code Status/Advance Care Planning:  Limited code -Kendra BattiestVeronica shares that she and her brother Kendra Nixon would like to allow a natural death, but their brother Kendra Nixon, would not agree at this point.  She shares that if Kendra Nixon needs intubation, they would agree at least for a couple of  days to allow Kendra Nixon to see that this was not good for their mother.  Kendra BattiestVeronica shares that Kendra Nixon had stated that she would not want life support.  No CPR  No defibrillation  Yes to ACLS drugs  Symptom Management:   Per hospitalist, no additional needs at this time.  Palliative Prophylaxis:   Aspiration and Oral Care  Additional Recommendations (Limitations, Scope, Preferences):  Continue to treat the treatable but no chest compressions no defibrillation  Psycho-social/Spiritual:   Desire for further Chaplaincy support:no  Additional Recommendations: Caregiving  Support/Resources and Education on Hospice  Prognosis:   < 3 months, or less would not be surprising based on functional decline, frailty, decreased by mouth intake.  If family chooses comfort only 1 month or less would not be surprising.  Discharge Planning: To be determined, based on outcomes.      Primary Diagnoses: Present on Admission: . AKI (acute kidney injury) (HCC) . CVA (cerebral vascular accident) (HCC) . Dementia (HCC) . Essential hypertension . Hyperlipidemia . Acute hypernatremia . Failure to thrive in adult   I have reviewed the medical record, interviewed the patient and family, and examined the patient. The following aspects are pertinent.  Past Medical History:  Diagnosis Date  . ACE-inhibitor cough   . AKI (acute kidney injury) (HCC) 09/2018  . Arthritis    "all over" (09/01/2016)  . CVA (cerebral vascular accident) (HCC)    Old left basal ganglion lacunar infarct noted per CT head (01/2013)  . CVA (cerebral vascular accident) (HCC) Nixon   "verbal before; nonverbal now; weak all over now" (09/01/2016)  . Dementia (HCC)    Kendra Nixon  . Hyperlipidemia LDL goal < 100   . Hypertension   . Solitary lung nodule    4 mm right upper lobe nodule  . UTI (urinary tract infection) Nixon   Kendra Nixon   Social History   Socioeconomic History  . Marital  status: Widowed    Spouse name: Not on file  . Number of children: 3  . Years of education: 11th grade  . Highest education level: Not on file  Occupational History  . 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 Financial resource strain: Not on file  . Food insecurity:    Worry: Not on file    Inability: Not on file  . Transportation needs:    Medical: Not on file    Non-medical: Not on file  Tobacco Use  . Smoking status: Former Smoker  Packs/day: 0.10    Years: 30.00    Pack years: 3.00    Types: Cigarettes  . Smokeless tobacco: Never Used  Substance and Sexual Activity  . Alcohol use: No  . Drug use: No  . Sexual activity: Not on file  Lifestyle  . Physical activity:    Days per week: Not on file    Minutes per session: Not on file  . Stress: Not on file  Relationships  . Social connections:    Talks on phone: Not on file    Gets together: Not on file    Attends religious service: Not on file    Active member of club or organization: Not on file    Attends meetings of clubs or organizations: Not on file    Relationship status: Not on file  Other Topics Concern  . 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 Breast cancer Sister [id number]  . Hypertension Other   . Alzheimer's disease Maternal Grandmother   . Alzheimer's disease Maternal Aunt   . Arthritis Mother   . Heart disease Mother   . Heart disease Father    Scheduled Meds: . aspirin  325 mg Oral Daily  . atorvastatin  80 mg Oral QODAY  . divalproex  500 mg Oral Daily  . feeding supplement (ENSURE ENLIVE)  237 mL Oral BID BM  . multivitamin with minerals  1 tablet Oral Daily  . QUEtiapine  12.5 mg Oral Daily  . QUEtiapine  25 mg  Oral QHS   Continuous Infusions: . cefTRIAXone (ROCEPHIN)  IV 1 g (09/22/18 1841)  . dextrose 5 % and 0.45% NaCl    . heparin 650 Units/hr (09/23/18 0731)   PRN Meds:.acetaminophen **OR** acetaminophen, ondansetron **OR** ondansetron (ZOFRAN) IV Medications Prior to Admission:  Prior to Admission medications   Medication Sig Start Date End Date Taking? Authorizing Provider  amLODipine (NORVASC) 10 MG tablet TAKE 1 TABLET BY MOUTH ONCE DAILY 07/10/18  Yes Evaristo BuryShambley, [name], NP  aspirin 325 MG tablet Take 1 tablet (325 mg total) by mouth daily. 09/03/16  Yes Dhungel, Nishant, MD  atorvastatin (LIPITOR) 80 MG tablet Take 1 tablet (80 mg total) by mouth every other day. 08/07/18  Yes Evaristo BuryShambley, [name], NP  divalproex (DEPAKOTE ER) 250 MG 24 hr tablet Take 2 tablets every evening 07/25/18  Yes Van ClinesAquino, Karen M, MD  Menthol-Methyl Salicylate (MUSCLE RUB) 10-15 % CREA Apply 1 application topically as needed for muscle pain (shoulder pain).   Yes [provider]  Multiple Vitamin (MULTIVITAMIN) tablet Take 1 tablet by mouth daily.   Yes [provider]  QUEtiapine (SEROQUEL) 25 MG tablet Take 1/2 tablet in AM, then 1.5 tablets every night 03/17/18  Yes Van ClinesAquino, Karen M, MD   No Known Allergies Review of Systems  Unable to perform ROS: Dementia    Physical Exam Vitals signs and nursing note reviewed.  Constitutional:      Comments: Appears acutely/chronically ill and frail, does not make eye contact  Cardiovascular:     Rate and Rhythm: Normal rate.  Pulmonary:     Effort: Pulmonary effort is normal. No respiratory distress.  Abdominal:     General: Abdomen is flat.  Musculoskeletal:  General: No swelling.  Skin:    General: Skin is warm and dry.  Neurological:     Comments: Known dementia, not cooperative  Psychiatric:     Comments: Not cooperative, fearful     Vital Signs: BP 109/63 (BP Location: Right Arm)   Pulse 83   Temp (!) 97.5 F (36.4 C)  (Oral)   Resp 18   Ht 5\' 5"  (1.651 m)   Wt 54.5 kg   SpO2 100%   BMI 19.99 kg/m  Pain Scale: PAINAD   Pain Score: 0-No pain   SpO2: SpO2: 100 % O2 Device:SpO2: 100 % O2 Flow Rate: .   IO: Intake/output summary:   Intake/Output Summary (Last 24 hours) at 09/23/2018 1165 Last data filed at 09/23/2018 7903 Gross per 24 hour  Intake 1313 ml  Output -  Net 1313 ml    LBM: Last BM Date: (PTA) Baseline Weight: Weight: 54.5 kg Most recent weight: Weight: 54.5 kg     Palliative Assessment/Data:   Flowsheet Rows     Most Recent Value  Intake Tab  Referral Department  Hospitalist  Unit at Time of Referral  Cardiac/Telemetry Unit  Palliative Care Primary Diagnosis  Neurology  Date Notified  09/22/18  Palliative Care Type  New Palliative care  Reason for referral  Clarify Goals of Care  Date of Admission  09/22/18  Date first seen by Palliative Care  09/23/18  # of days Palliative referral response time  1 Day(s)  # of days IP prior to Palliative referral  0  Clinical Assessment  Palliative Performance Scale Score  20%  Pain Max last 24 hours  Not able to report  Pain Min Last 24 hours  Not able to report  Dyspnea Max Last 24 Hours  Not able to report  Dyspnea Min Last 24 hours  Not able to report  Psychosocial & Spiritual Assessment  Palliative Care Outcomes  Patient/Family meeting held?  Yes  Who was at the meeting?  Daughter Kendra [place] at bedside  Palliative Care Outcomes  Clarified goals of care, Provided psychosocial or spiritual support, Provided advance care planning, Changed CPR status      Time In: 0820 Time Out: 0930 Time Total: 70 minutes Greater than 50%  of this time was spent counseling and coordinating care related to the above assessment and plan.  Signed by: Katheran Awe, NP   Please contact Palliative Medicine Team phone at [phone] for questions and concerns.  For individual provider: See Loretha Stapler

## 2018-09-23 NOTE — Progress Notes (Signed)
[facility]                                                                              Patient Demographics  Kendra Nixon, is a 78 y.o. female, DOB - [birth year], GNF:[id number]  Admit date - 09/22/2018   Admitting Physician  Jenna LuoK , MD  Outpatient Primary MD for the patient is Evaristo BuryShambley, [name], NP  Outpatient specialists:   LOS - 1  days   Medical records reviewed and are as summarized below:    Chief Complaint  Patient presents with  . Failure To Thrive       Brief summary   Patient is a 78 year old female with history of CVA with right-sided weakness, dementia, HTN, HLP was sent from PCPs office due to failure to thrive, hypotension, agitation.  Patient lives at home with her daughter who has been taking care of her.  Daughter reported that patient has been declining over the past month.  6 weeks ago, she was able to walk some and now she is in wheelchair.  She has been eating less and less in the last 2 to 3 weeks and was unable to take anything oral in last 2 days PTA.  She has also been more lethargic and sleeping more. Daughter reports no nausea vomiting any fever chills or diarrhea.  She also reported waxing and waning mental status changes with confusion and agitation On admission Na 151, creatinine 1.3, AST 63, WBCs 11.6  Assessment & Plan    Principal Problem: Hypernatremia with AKI (acute kidney injury) (HCC), anion gap metabolic acidosis: Likely due to poor oral intake in the last 2 to 3 weeks -Sodium improving 149, creatinine improved 1.0 with IV fluid hydration  -Currently on dysphagia 1 diet, SLP evaluation pending, not eating  -TSH normal  Active Problems:   Hyperlipidemia -CK 194 continue statin    Essential hypertension -BP currently borderline, continue IV fluid hydration, hold amlodipine   Acute metabolic encephalopathy with dementia (HCC) with failure to thrive -Follow urine culture and sensitivities, for now  continue IV Rocephin until cultures back UA with ketones, many bacteria, however no leukocytes nitrites, will follow urine culture,  -Continue Seroquel.  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   Pressure injury of the skin -Multiple decubitus decub's on right hip, sacral decub, left lower leg, heel in various stages of stage II and stage III -Wound care consult placed  Goals of care -I had addressed goals of care with the patient's daughter given advanced and worsening dementia, likely end-stage, contractures, multiple decub ulcers, failure to thrive, and poor oral intake, likely needs hospice.  Daughter wanted to talk with her family before any decisions -Palliative medicine consulted  Code Status: Full CODE STATUS DVT Prophylaxis: Heparin drip Family Communication: No family member at the bedside   Disposition Plan: Will await goals of care  Time Spent in minutes 35 minutes  Procedures:  Doppler ultrasound of the left lower extremity + for acute DVT in the popliteal vein  Consultants:   Palliative medicine  Antimicrobials:   Anti-infectives (From admission, onward)   Start     Dose/Rate Route Frequency Ordered Stop   09/22/18 1800  cefTRIAXone (ROCEPHIN) 1 g in sodium chloride 0.9 % 100 mL IVPB     1 g 200 mL/hr over 30 Minutes Intravenous Every 24 hours 09/22/18 1727            Medications  Scheduled Meds: . aspirin  325 mg Oral Daily  . atorvastatin  80 mg Oral QODAY  . divalproex  500 mg Oral Daily  . feeding supplement (ENSURE ENLIVE)  237 mL Oral BID BM  . multivitamin with minerals  1 tablet Oral Daily  . QUEtiapine  12.5 mg Oral Daily  . QUEtiapine  25 mg Oral QHS    Continuous Infusions: . cefTRIAXone (ROCEPHIN)  IV 1 g (09/22/18 1841)  . dextrose 5 % and 0.45% NaCl    . heparin 650 Units/hr (09/23/18 0731)   PRN Meds:.acetaminophen **OR** acetaminophen, ondansetron **OR** ondansetron (ZOFRAN) IV      Subjective:   Kendra Nixon was seen and examined today.  Confused, somewhat agitated, unable to obtain any review of system from the patient.  Afebrile.  Objective:   Vitals:   09/22/18 1731 09/22/18 2213 09/23/18 0500 09/23/18 0600  BP: 106/82 (!) 129/95  109/63  Pulse: 83 86 83 83  Resp: 18 18  18   Temp: 98.7 F (37.1 C) 97.7 F (36.5 C)  (!) 97.5 F (36.4 C)  TempSrc: Oral Oral  Oral  SpO2: 99% 97%  100%  Weight: 54.5 kg     Height: 5\' 5"  (1.651 m)       Intake/Output Summary (Last 24 hours) at 09/23/2018 1044 Last data filed at 09/23/2018 1028 Gross per 24 hour  Intake 1373 ml  Output -  Net 1373 ml     Wt Readings from Last 3 Encounters:  09/22/18 54.5 kg  08/07/18 59 kg  03/17/18 55.3 kg     Exam  General: Awake and confused, somewhat agitated when trying to examine  Eyes:   HEENT:  Atraumatic, normocephalic, normal oropharynx  Cardiovascular: S1 S2 auscultated,  Regular rate and rhythm.  Respiratory: Clear to auscultation bilaterally, no wheezing, rales or rhonchi  Gastrointestinal: Soft, nontender, nondistended, + bowel sounds  Ext: no pedal edema bilaterally  Neuro: Does not cooperate, contractures both upper and lower extremities  Musculoskeletal: No digital cyanosis, clubbing  Skin: Multiple decub's, sacral decub, right hip, both lower extremities with 1 on the left shin in various stages  Psych: Confused and agitated   Data Reviewed:  I have personally reviewed following labs and imaging studies  Micro Results No results found for this or any previous visit (from the past 240 hour(s)).  Radiology Reports Ct Head Wo Contrast  Result Date: 09/22/2018 CLINICAL DATA:  78 year old female  with encephalopathy. EXAM: CT HEAD WITHOUT CONTRAST TECHNIQUE: Contiguous axial images were obtained from the base of the skull through the vertex without intravenous contrast. COMPARISON:  Brain MRI dated 09/01/2016 and CT dated 08/31/2016 FINDINGS: Brain: There is moderate age-related atrophy and chronic microvascular ischemic changes. There is no acute intracranial hemorrhage. No mass effect or midline shift. Old left occipital infarct. Vascular: No hyperdense vessel or unexpected calcification. Skull: Normal. Negative for fracture or focal lesion. Sinuses/Orbits: No acute finding. Other: None IMPRESSION: 1. No acute intracranial hemorrhage. 2.  Moderate age-related atrophy and chronic microvascular ischemic changes. Old left occipital infarct. Electronically Signed   By: Elgie Collard M.D.   On: 09/22/2018 21:23   Dg Chest Port 1 View  Result Date: 09/22/2018 CLINICAL DATA:  Aspiration. EXAM: PORTABLE CHEST 1 VIEW COMPARISON:  Radiographs of August 31, 2016. FINDINGS: No pneumothorax or pleural effusion is noted. Stable cardiomediastinal silhouette. Both lungs are clear. The visualized skeletal structures are unremarkable. IMPRESSION: No acute cardiopulmonary abnormality seen. Electronically Signed   By: Lupita Raider, M.D.   On: 09/22/2018 13:17   Vas [place] Lower Extremity Venous (dvt) (only [facility])  Result Date: 09/22/2018  Lower Venous Study Indications: Swelling, and Edema.  Limitations: Extremely contracted body with stiffness, combativeness. Comparison Study: lower extremity venous duplex exam on 03/21/2018 Performing Technologist: Melodie Bouillon  Examination Guidelines: A complete evaluation includes B-mode imaging, spectral Doppler, color Doppler, and power Doppler as needed of all accessible portions of each vessel. Bilateral testing is considered an integral part of a complete examination. Limited examinations for reoccurring indications may be performed as noted.  Right Venous Findings:  +---+---------------+---------+-----------+----------+-------------------------+    CompressibilityPhasicitySpontaneityPropertiesSummary                   +---+---------------+---------+-----------+----------+-------------------------+ CFV                                             unable to obtain due to                                                   extremely contracted                                                      body, patient refused to                                                  coorperate                +---+---------------+---------+-----------+----------+-------------------------+  Left Venous Findings: +---------+---------------+---------+-----------+----------+-------------------+          CompressibilityPhasicitySpontaneityPropertiesSummary             +---------+---------------+---------+-----------+----------+-------------------+ CFV      None                    No                   Chronic             +---------+---------------+---------+-----------+----------+-------------------+ SFJ                                                   Not visualized      +---------+---------------+---------+-----------+----------+-------------------+ FV Prox  unable to obtain                                                          due to extremely                                                          contracted body,                                                          patient refused to                                                        coorperate          +---------+---------------+---------+-----------+----------+-------------------+ FV Mid                                                unable to obtain                                                          due to extremely                                                           contracted body,                                                          patient refused to                                                        coorperate          +---------+---------------+---------+-----------+----------+-------------------+ FV Distal                                             unable to obtain  due to extremely                                                          contracted body,                                                          patient refused to                                                        coorperate          +---------+---------------+---------+-----------+----------+-------------------+ PFV                                                   Not visualized      +---------+---------------+---------+-----------+----------+-------------------+ POP      None           No       No                   Acute               +---------+---------------+---------+-----------+----------+-------------------+ PTV                                                   Not visualized      +---------+---------------+---------+-----------+----------+-------------------+ PERO                                                  Not visualized      +---------+---------------+---------+-----------+----------+-------------------+    Summary: Left: Findings consistent with acute deep vein thrombosis involving the left popliteal vein. Findings consistent with chronic deep vein thrombosis involving the left femoral vein.  *See table(s) above for measurements and observations.    Preliminary     Lab Data:  CBC: Recent Labs  Lab 09/22/18 1241 09/22/18 1852 09/23/18 0508  WBC 11.6* 10.2 10.2  NEUTROABS 8.8*  --   --   HGB 13.2 12.4 12.4  HCT 43.9 38.8 37.8  MCV 95.0 89.6 88.3  PLT 170 160 136*   Basic Metabolic Panel: Recent Labs  Lab 09/22/18 1241  09/22/18 1852 09/23/18 0508  NA 151*  --  149*  K 4.3  --  3.9  CL 114*  --  112*  CO2 18*  --  23  GLUCOSE 119*  --  153*  BUN 25*  --  24*  CREATININE 1.38* 1.02* 1.00  CALCIUM 9.8  --  9.2   GFR: Estimated Creatinine Clearance: 39.2 mL/min (by C-G formula based on SCr  of 1 mg/dL). Liver Function Tests: Recent Labs  Lab 09/22/18 1241  AST 63*  ALT 26  ALKPHOS 71  BILITOT 1.2  PROT 7.5  ALBUMIN 2.9*   No results for input(s): LIPASE, AMYLASE in the last 168 hours. Recent Labs  Lab 09/22/18 1607  AMMONIA 38*   Coagulation Profile: No results for input(s): INR, PROTIME in the last 168 hours. Cardiac Enzymes: Recent Labs  Lab 09/22/18 1852  CKTOTAL 194   BNP (last 3 results) Recent Labs    03/16/18 1341  PROBNP 90.0   HbA1C: No results for input(s): HGBA1C in the last 72 hours. CBG: Recent Labs  Lab 09/22/18 1243  GLUCAP 106*   Lipid Profile: No results for input(s): CHOL, HDL, LDLCALC, TRIG, CHOLHDL, LDLDIRECT in the last 72 hours. Thyroid Function Tests: Recent Labs    09/22/18 1915  TSH 2.126   Anemia Panel: No results for input(s): VITAMINB12, FOLATE, FERRITIN, TIBC, IRON, RETICCTPCT in the last 72 hours. Urine analysis:    Component Value Date/Time   COLORURINE AMBER (A) 09/22/2018 1523   APPEARANCEUR HAZY (A) 09/22/2018 1523   LABSPEC 1.026 09/22/2018 1523   PHURINE 5.0 09/22/2018 1523   GLUCOSEU NEGATIVE 09/22/2018 1523   HGBUR NEGATIVE 09/22/2018 1523   BILIRUBINUR NEGATIVE 09/22/2018 1523   KETONESUR 20 (A) 09/22/2018 1523   PROTEINUR 30 (A) 09/22/2018 1523   UROBILINOGEN 1.0 01/30/2013 0322   NITRITE NEGATIVE 09/22/2018 1523   LEUKOCYTESUR NEGATIVE 09/22/2018 1523       M.D. [facility] 09/23/2018, 10:44 AM  Pager: [phone] Between 7am to 7pm - call Pager - [phone]  After 7pm go to [email or web address] - password [email or web address]  Call night coverage person covering after 7pm

## 2018-09-23 NOTE — Progress Notes (Signed)
ANTICOAGULATION CONSULT NOTE   Pharmacy Consult for Heparin Indication: LLE DVT  No Known Allergies  Patient Measurements: Height: 5\' 5"  (165.1 cm) Weight: 120 lb 1.6 oz (54.5 kg) IBW/kg (Calculated) : 57 Heparin Dosing Weight: 54.5 kg  Vital Signs: Temp: 97.7 F (36.5 C) (01/17 2213) Temp Source: Oral (01/17 2213) BP: 129/95 (01/17 2213) Pulse Rate: 86 (01/17 2213)  Labs: Recent Labs    09/22/18 1241 09/22/18 1852 09/23/18 0508  HGB 13.2 12.4 12.4  HCT 43.9 38.8 37.8  PLT 170 160 136*  HEPARINUNFRC  --   --  1.40*  CREATININE 1.38* 1.02* 1.00  CKTOTAL  --  194  --     Estimated Creatinine Clearance: 39.2 mL/min (by C-G formula based on SCr of 1 mg/dL).   Medical History: Past Medical History:  Diagnosis Date  . ACE-inhibitor cough   . AKI (acute kidney injury) (HCC) 09/2018  . Arthritis    "all over" (09/01/2016)  . CVA (cerebral vascular accident) (HCC)    Old left basal ganglion lacunar infarct noted per CT head (01/2013)  . CVA (cerebral vascular accident) (HCC) 08/31/2016   "verbal before; nonverbal now; weak all over now" (09/01/2016)  . Dementia (HCC)    Hattie Perch 08/31/2016  . Hyperlipidemia LDL goal < 100   . Hypertension   . Solitary lung nodule    4 mm right upper lobe nodule  . UTI (urinary tract infection) 08/31/2016   Hattie Perch 08/31/2016    Medications:  Medications Prior to Admission  Medication Sig Dispense Refill Last Dose  . amLODipine (NORVASC) 10 MG tablet TAKE 1 TABLET BY MOUTH ONCE DAILY 90 tablet 0 09/21/2018 at am  . aspirin 325 MG tablet Take 1 tablet (325 mg total) by mouth daily. 30 tablet 0 09/21/2018 at am  . atorvastatin (LIPITOR) 80 MG tablet Take 1 tablet (80 mg total) by mouth every other day. 90 tablet 0 09/21/2018 at Unknown time  . divalproex (DEPAKOTE ER) 250 MG 24 hr tablet Take 2 tablets every evening 180 tablet 3 09/21/2018 at Unknown time  . Menthol-Methyl Salicylate (MUSCLE RUB) 10-15 % CREA Apply 1 application  topically as needed for muscle pain (shoulder pain).   Past Week at Unknown time  . Multiple Vitamin (MULTIVITAMIN) tablet Take 1 tablet by mouth daily.   09/21/2018 at Unknown time  . QUEtiapine (SEROQUEL) 25 MG tablet Take 1/2 tablet in AM, then 1.5 tablets every night 180 tablet 3 09/21/2018 at Unknown time   Infusions:  . cefTRIAXone (ROCEPHIN)  IV 1 g (09/22/18 1841)  . dextrose 5 % and 0.45% NaCl    . heparin      Assessment: 16 YOF who presented on 1/17 with FTT and hypotension. Noted advancing dementia and now in wheelchair. Dopplers this evening showed an acute LLE DVT in the popliteal vein. Pharmacy consulted to start Heparin for anticoagulation.  1/18 AM update: initial heparin level is high, verified that labs were drawn from arm opposite of heparin infusion, no other issues per RN.   Goal of Therapy:  Heparin level 0.3-0.7 units/ml Monitor platelets by anticoagulation protocol: Yes   Plan:  Hold heparin x 1 hr Re-start heparin drip at 650 units/hr at 0730 Re-check heparin level at 1530  Abran Duke, PharmD, BCPS Clinical Pharmacist Phone: [phone]

## 2018-09-23 NOTE — Evaluation (Signed)
Clinical/Bedside Swallow Evaluation Patient Details  Name: Kendra Nixon MRN: [id number] Date of Birth: 04-27-1941  Today's Date: 09/23/2018 Time: SLP Start Time (ACUTE ONLY): 0941 SLP Stop Time (ACUTE ONLY): 1010 SLP Time Calculation (min) (ACUTE ONLY): 29 min  Past Medical History:  Past Medical History:  Diagnosis Date  . ACE-inhibitor cough   . AKI (acute kidney injury) (HCC) 09/2018  . Arthritis    "all over" (09/01/2016)  . CVA (cerebral vascular accident) (HCC)    Old left basal ganglion lacunar infarct noted per CT head (01/2013)  . CVA (cerebral vascular accident) (HCC) 08/31/2016   "verbal before; nonverbal now; weak all over now" (09/01/2016)  . Dementia (HCC)    Hattie Perch 08/31/2016  . Hyperlipidemia LDL goal < 100   . Hypertension   . Solitary lung nodule    4 mm right upper lobe nodule  . UTI (urinary tract infection) 08/31/2016   Hattie Perch 08/31/2016   Past Surgical History:  Past Surgical History:  Procedure Laterality Date  . BREAST LUMPECTOMY Bilateral 1960   "benign"  . FEMUR IM NAIL Right 08/16/2014   Procedure: INTRAMEDULLARY (IM) NAIL FEMORAL;  Surgeon: Sheral Apley, MD;  Location: [facility] OR;  Service: Orthopedics;  Laterality: Right;  . FRACTURE SURGERY     HPI:  78 year old female with history of CVA with right-sided weakness, dementia, hypertension hyperlipidemia was seen at her PCPs office on 09/22/18.  Patient lives at home with her daughter who has been taking care of her; BSE initiated d/t limited PO intake; currently on Dysphagia 1/thin liquid diet.   Assessment / Plan / Recommendation Clinical Impression   Limited BSE completed d/t pt compliance/agitation; cognitive-based oropharyngeal dysphagia noted during consumption of thin via straw and puree consistencies; oral holding, limited mastication/oral propulsion/manipulation noted with oral expulsion d/t underlying dementia/confusion and decreased sustained attention during consumption; no overt s/s of  aspiration noted during consumption, but limited swallowing noted during BSE; recommend continue Dysphagia 1/thin liquids via straw (daughter stated this is baseline at home with no noted issues); CXR on 09/22/18 [facility], but pt's mentation/behavior places her at a mild risk for aspiration during intake; ST will f/u to continue assessment of swallowing/diet recommendation and initiate swallowing strategies to increase PO intake d/t decreased appetite/baseline cognition; educated daughter re: swallowing strategies/aspiration precautions during PO intake during BSE completion. SLP Visit Diagnosis: Dysphagia, oropharyngeal phase (R13.12)    Aspiration Risk  Mild aspiration risk;Risk for inadequate nutrition/hydration    Diet Recommendation   Dysphagia 1/thin liquids   Medication Administration: Crushed with puree(and/or alternative means)    Other  Recommendations Oral Care Recommendations: Oral care BID   Follow up Recommendations 24 hour supervision/assistance      Frequency and Duration min 2x/week  1 week       Prognosis Prognosis for Safe Diet Advancement: Good Barriers to Reach Goals: Cognitive deficits      Swallow Study   General Date of Onset: 09/22/18 HPI: 78 year old female with history of CVA with right-sided weakness, dementia, hypertension hyperlipidemia was seen at her PCPs office today.  Patient lives at home with her daughter who has been taking care of her.  Type of Study: Bedside Swallow Evaluation Previous Swallow Assessment: (n/a) Diet Prior to this Study: Thin liquids;Dysphagia 1 (puree) Temperature Spikes Noted: No Respiratory Status: Room air History of Recent Intubation: No Behavior/Cognition: Alert;Agitated;Confused;Requires cueing Oral Care Completed by SLP: Recent completion by staff Oral Cavity - Dentition: Missing dentition Self-Feeding Abilities: Needs assist Patient Positioning: Upright in bed Baseline Vocal  Quality: Low vocal intensity Volitional  Cough: Cognitively unable to elicit Volitional Swallow: Unable to elicit    Oral/Motor/Sensory Function Overall Oral Motor/Sensory Function: Generalized oral weakness   Ice Chips Ice chips: Not tested   Thin Liquid Thin Liquid: Impaired Presentation: Straw Oral Phase Impairments: Reduced labial seal Oral Phase Functional Implications: Right anterior spillage;Left anterior spillage;Oral holding Pharyngeal  Phase Impairments: Suspected delayed Swallow;Other (comments)(oral expulsion)    Nectar Thick Nectar Thick Liquid: Not tested   Honey Thick Honey Thick Liquid: Not tested   Puree Puree: Impaired Presentation: Spoon Oral Phase Impairments: Reduced labial seal;Impaired mastication Oral Phase Functional Implications: Right anterior spillage;Left anterior spillage;Left lateral sulci pocketing;Oral holding;Oral residue Pharyngeal Phase Impairments: Suspected delayed Swallow;Other (comments)(oral expulsion)   Solid     Solid: Not tested      Tressie Stalker, M.S., CCC-SLP 09/23/2018,11:24 AM

## 2018-09-24 DIAGNOSIS — F0150 Vascular dementia without behavioral disturbance: Secondary | ICD-10-CM

## 2018-09-24 DIAGNOSIS — I82412 Acute embolism and thrombosis of left femoral vein: Secondary | ICD-10-CM

## 2018-09-24 LAB — BASIC METABOLIC PANEL
Anion gap: 11 (ref 5–15)
BUN: 15 mg/dL (ref 8–23)
CO2: 19 mmol/L — AB (ref 22–32)
Calcium: 8.6 mg/dL — ABNORMAL LOW (ref 8.9–10.3)
Chloride: 115 mmol/L — ABNORMAL HIGH (ref 98–111)
Creatinine, Ser: 0.76 mg/dL (ref 0.44–1.00)
GFR calc Af Amer: >60 mL/min (ref >60)
GFR calc non Af Amer: >60 mL/min (ref >60)
Glucose, Bld: 138 mg/dL — ABNORMAL HIGH (ref 70–99)
Potassium: 3.9 mmol/L (ref 3.5–5.1)
Sodium: 145 mmol/L (ref 135–145)

## 2018-09-24 LAB — HEPARIN LEVEL (UNFRACTIONATED)
Heparin Unfractionated: 0.91 IU/mL — ABNORMAL HIGH (ref 0.30–0.70)
Heparin Unfractionated: 1.1 IU/mL — ABNORMAL HIGH (ref 0.30–0.70)
Heparin Unfractionated: 1.09 IU/mL — ABNORMAL HIGH (ref 0.30–0.70)

## 2018-09-24 MED ORDER — HEPARIN (PORCINE) 25000 UT/250ML-% IV SOLN
450.0000 [IU]/h | INTRAVENOUS | Status: DC
  Administered 2018-09-24: 400 [IU]/h via INTRAVENOUS

## 2018-09-24 NOTE — Consult Note (Signed)
WOC Nurse wound consult note Reason for Consult: multiple pressure injuries, also non pressure wound on left LE. Wound type:Pressure, FTT in adult Pressure Injury POA: Yes Left anterior LE with chronic wound (fullthickness): 3cm x 1cm c 0.2com with 25% necrotic yellow slough in wound bed, 80% red. Left LE distal wound is a 4cm x 1.5cm area of purple-red discoloration DTPI There are two full thickness infarct lesions on the left foot at the first metatarsal head and the tip of the second toe Sacrum: Stage 2, 1cm x 1.5cm x 0.2cm  Red, moist, scant exudate Right IT: Stage 3, 7cm x 4cm x 2cm with red (60%), thin yellow slough (40%) tissue. Small to moderate exudate, serous Right hip: Stage 2, 2cm x 2cm x 0.2cm red, moist, no exudate. Right lateral malleolus:  DTPI, 3cm x 2.2cm Purple/Red discoloration, no exudate Measurement: As described above. Wound bed: As described above Drainage (amount, consistency, odor) small to moderate amount of serous exudate Periwound: dry with poor turgor Dressing procedure/placement/frequency: I will provide a mattress replacement with low air loss feature and staff will continue to turn side to side.  Prevalon Boots are provided for flotation of heels and for correction of lateral rotation of feet. Topical wound care guidance is provided. Registered Dietician has been in to see. Ideally, patient would have HOB lowered to a 30 degree angle 0or less to decrease pressure in the sitting position on the ITs.  She requires HOB elevation after ingesting food of fluids due to swallowing difficulties.  WOC nursing team will not follow, but will remain available to this patient, the nursing and medical teams.  Please re-consult if needed. Thanks, Ladona Mow, MSN, RN, GNP, [place]  Pager# [phone]

## 2018-09-24 NOTE — Progress Notes (Signed)
  Speech Language Pathology Treatment: Dysphagia  Patient Details Name: Kendra Nixon MRN: [id number] DOB: 1941/05/02 Today's Date: 09/24/2018 Time: 2025-4270 SLP Time Calculation (min) (ACUTE ONLY): 11 min  Assessment / Plan / Recommendation Clinical Impression  Pt seen with MD and daughter at bedside, attempted am meal tray. Pt in distress with minimal repositioning of bed for PO intake despite SLP trying to maintain pts comfort. Pt sensed tactile cues, but cried out with any PO administered, oral holding and anterior spill occurred. As bolus spilled to throat a swallow was triggered, but no volitional oral manipulation was observed. Overall pt takes no pleasure in PO and has to be forced to eat and drink with high risk of aspiration and expected poor nutritional support. Discussed finding with MD and daughter as they were discussing goals of care. Await decisions from family and needs, but further SLP interventions unlikely to be beneficial at this point.   HPI HPI: 78 year old female with history of CVA with right-sided weakness, dementia, hypertension hyperlipidemia was seen at her PCPs office today.  Patient lives at home with her daughter who has been taking care of her.       SLP Plan  Continue with current plan of care       Recommendations  Diet recommendations: Dysphagia 1 (puree);Thin liquid Liquids provided via: Straw Medication Administration: Crushed with puree Supervision: Full supervision/cueing for compensatory strategies                Oral Care Recommendations: Oral care BID Follow up Recommendations: 24 hour supervision/assistance SLP Visit Diagnosis: Dysphagia, oropharyngeal phase (R13.12) Plan: Continue with current plan of care       GO               Harlon Ditty, MA CCC-SLP  Acute Rehabilitation Services Pager [phone] Office [phone]  Claudine Mouton 09/24/2018, 9:28 AM

## 2018-09-24 NOTE — Progress Notes (Signed)
[facility]                                                                              Patient Demographics  Kendra Nixon, is a 78 y.o. female, DOB - 09-10-1940, ZOX:[id number]  Admit date - 09/22/2018   Admitting Physician Kendra Shorb [name], MD  Outpatient Primary MD for the patient is Kendra BuryShambley, [name], NP  Outpatient specialists:   LOS - 2  days   Medical records reviewed and are as summarized below:    Chief Complaint  Patient presents with  . Failure To Thrive       Brief summary   Patient is a 78 year old female with history of CVA with right-sided weakness, dementia, HTN, HLP was sent from PCPs office due to failure to thrive, hypotension, agitation.  Patient lives at home with her daughter who has been taking care of her.  Daughter reported that patient has been declining over the past month.  6 weeks ago, she was able to walk some and now she is in wheelchair.  She has been eating less and less in the last 2 to 3 weeks and was unable to take anything oral in last 2 days PTA.  She has also been more lethargic and sleeping more. Daughter reports no nausea vomiting any fever chills or diarrhea.  She also reported waxing and waning mental status changes with confusion and agitation On admission Na 151, creatinine 1.3, AST 63, WBCs 11.6  Assessment & Plan    Principal Problem: Hypernatremia with AKI (acute kidney injury) (HCC), anion gap metabolic acidosis: Likely due to poor oral intake in the last 2 to 3 weeks -Labs have been ordered, still not done, on IV fluid hydration, sodium was improving yesterday  -Continue IV fluids, poor oral intake.  SLP evaluation done, recommended to continue dysphagia 1 pure diet with thin liquids -TSH normal  Active Problems:   Hyperlipidemia -CK 194 continue statin    Essential hypertension -BP currently stable, continue IV fluid hydration   Acute metabolic encephalopathy with dementia (HCC) with failure to  thrive -Urine culture showed no growth, DC IV Rocephin -Continue Seroquel.  - CT head showed no acute stroke  History of CVA (cerebral vascular accident) (HCC) with right-sided weakness, wheelchair-bound -Continue aspirin, statin  History of seizures -Continue Depakote  Left lower leg wound, acute left lower extremity DVT -LLE duplex showed ac DVT in popliteal vein, for now continue heparin drip until further decision by family regarding goals of care.  If patient is consistently taking p.o. meds, will place on Xarelto versus Lovenox.   Pressure injury of the skin -Multiple decubitus decub's on right hip, sacral decub, left lower leg, heel in various stages of stage II and stage III -Wound care consult placed  Goals of care -I again discussed goals of care with patient's daughter, she is leaning towards comfort care however states 1 son is opposed.  Requested palliative care to include the son in goals of care meeting.   Code Status: Full CODE STATUS DVT Prophylaxis: Heparin drip Family Communication: Discussed in detail and updated patient's daughter at the bedside   Disposition Plan:  Will await goals of care  Time Spent in minutes 25 minutes  Procedures:  Doppler ultrasound of the left lower extremity + for acute DVT in the popliteal vein  Consultants:   Palliative medicine  Antimicrobials:   Anti-infectives (From admission, onward)   Start     Dose/Rate Route Frequency Ordered Stop   09/22/18 1800  cefTRIAXone (ROCEPHIN) 1 g in sodium chloride 0.9 % 100 mL IVPB  Status:  Discontinued     1 g 200 mL/hr over 30 Minutes Intravenous Every 24 hours 09/22/18 1727 09/24/18 0902         Medications  Scheduled Meds: . aspirin  325 mg Oral Daily  . atorvastatin  80 mg Oral QODAY  . divalproex  500 mg Oral Daily  . feeding supplement (ENSURE ENLIVE)  237 mL Oral BID BM  . multivitamin with minerals  1 tablet Oral Daily  . QUEtiapine  12.5 mg Oral Daily  .  QUEtiapine  25 mg Oral QHS   Continuous Infusions: . dextrose 5 % and 0.45% NaCl 1,000 mL (09/23/18 1700)  . heparin 550 Units/hr (09/24/18 1005)   PRN Meds:.acetaminophen **OR** acetaminophen, ondansetron **OR** ondansetron (ZOFRAN) IV      Subjective:   Kendra Nixon was seen and examined today.  Still very confused, did not cooperate much with SLP evaluation.  Appeared to be uncomfortable.  Afebrile.  Daughter at the bedside.  Objective:   Vitals:   09/23/18 2106 09/24/18 0553 09/24/18 0555 09/24/18 0600  BP: 91/71 (!) 168/128 (!) 122/104 120/85  Pulse: 82 69 89   Resp: 18 18    Temp: 98.2 F (36.8 C) (!) 97.5 F (36.4 C)    TempSrc: Oral Oral    SpO2: 96% 92%    Weight:      Height:        Intake/Output Summary (Last 24 hours) at 09/24/2018 1142 Last data filed at 09/24/2018 0657 Gross per 24 hour  Intake 1094.85 ml  Output 125 ml  Net 969.85 ml     Wt Readings from Last 3 Encounters:  09/22/18 54.5 kg  08/07/18 59 kg  03/17/18 55.3 kg    Physical Exam  General: Alert and awake, confused, advanced dementia, agitated  Eyes:   HEENT:    Cardiovascular: S1 S2 clear, RRR. No pedal edema b/l  Respiratory: CTAB, no wheezing, rales or rhonchi  Gastrointestinal: Soft, nontender, nondistended, NBS  Ext: Bilateral contractures, no pedal edema  Neuro: Does not cooperate  Musculoskeletal: No cyanosis, clubbing  Skin: No rashes  Psych: Advanced dementia, somewhat agitated   Data Reviewed:  I have personally reviewed following labs and imaging studies  Micro Results Recent Results (from the past 240 hour(s))  Urine Culture     Status: None   Collection Time: 09/22/18  3:23 PM  Result Value Ref Range Status   Specimen Description URINE, CATHETERIZED  Final   Special Requests NONE  Final   Culture   Final    NO GROWTH Performed at [facility], [address], [place], [place] [address]    Report Status 09/23/2018 FINAL  Final     Radiology Reports Ct Head Wo Contrast  Result Date: 09/22/2018 CLINICAL DATA:  78 year old female with encephalopathy. EXAM: CT HEAD WITHOUT CONTRAST TECHNIQUE: Contiguous axial images were obtained from the base of the skull through the vertex without intravenous contrast. COMPARISON:  Brain MRI dated 09/01/2016 and CT dated 08/31/2016 FINDINGS: Brain: There is moderate age-related atrophy and chronic microvascular ischemic  changes. There is no acute intracranial hemorrhage. No mass effect or midline shift. Old left occipital infarct. Vascular: No hyperdense vessel or unexpected calcification. Skull: Normal. Negative for fracture or focal lesion. Sinuses/Orbits: No acute finding. Other: None IMPRESSION: 1. No acute intracranial hemorrhage. 2. Moderate age-related atrophy and chronic microvascular ischemic changes. Old left occipital infarct. Electronically Signed   By: Elgie CollardArash  Radparvar M.D.   On: 09/22/2018 21:23   Dg Chest Port 1 View  Result Date: 09/22/2018 CLINICAL DATA:  Aspiration. EXAM: PORTABLE CHEST 1 VIEW COMPARISON:  Radiographs of August 31, 2016. FINDINGS: No pneumothorax or pleural effusion is noted. Stable cardiomediastinal silhouette. Both lungs are clear. The visualized skeletal structures are unremarkable. IMPRESSION: No acute cardiopulmonary abnormality seen. Electronically Signed   By: Lupita RaiderJames  Green Jr, M.D.   On: 09/22/2018 13:17   Vas [place] Lower Extremity Venous (dvt) (only Mc & [facility])  Result Date: 09/22/2018  Lower Venous Study Indications: Swelling, and Edema.  Limitations: Extremely contracted body with stiffness, combativeness. Comparison Study: lower extremity venous duplex exam on 03/21/2018 Performing Technologist: Melodie BouillonSelina Cole  Examination Guidelines: A complete evaluation includes B-mode imaging, spectral Doppler, color Doppler, and power Doppler as needed of all accessible portions of each vessel. Bilateral testing is considered an integral part of a complete  examination. Limited examinations for reoccurring indications may be performed as noted.  Right Venous Findings: +---+---------------+---------+-----------+----------+-------------------------+    CompressibilityPhasicitySpontaneityPropertiesSummary                   +---+---------------+---------+-----------+----------+-------------------------+ CFV                                             unable to obtain due to                                                   extremely contracted                                                      body, patient refused to                                                  coorperate                +---+---------------+---------+-----------+----------+-------------------------+  Left Venous Findings: +---------+---------------+---------+-----------+----------+-------------------+          CompressibilityPhasicitySpontaneityPropertiesSummary             +---------+---------------+---------+-----------+----------+-------------------+ CFV      None                    No                   Chronic             +---------+---------------+---------+-----------+----------+-------------------+ SFJ  Not visualized      +---------+---------------+---------+-----------+----------+-------------------+ FV Prox                                               unable to obtain                                                          due to extremely                                                          contracted body,                                                          patient refused to                                                        coorperate          +---------+---------------+---------+-----------+----------+-------------------+ FV Mid                                                unable to obtain                                                           due to extremely                                                          contracted body,                                                          patient refused to                                                        coorperate          +---------+---------------+---------+-----------+----------+-------------------+  FV Distal                                             unable to obtain                                                          due to extremely                                                          contracted body,                                                          patient refused to                                                        coorperate          +---------+---------------+---------+-----------+----------+-------------------+ PFV                                                   Not visualized      +---------+---------------+---------+-----------+----------+-------------------+ POP      None           No       No                   Acute               +---------+---------------+---------+-----------+----------+-------------------+ PTV                                                   Not visualized      +---------+---------------+---------+-----------+----------+-------------------+ PERO                                                  Not visualized      +---------+---------------+---------+-----------+----------+-------------------+    Summary: Left: Findings consistent with acute deep vein thrombosis involving the left popliteal vein. Findings consistent with chronic deep vein thrombosis involving the left femoral vein.  *See table(s) above for measurements and observations.    Preliminary     Lab Data:  CBC: Recent Labs  Lab 09/22/18 1241 09/22/18 1852 09/23/18 0508  WBC 11.6* 10.2 10.2  NEUTROABS 8.8*  --   --  HGB 13.2 12.4 12.4  HCT 43.9 38.8 37.8   MCV 95.0 89.6 88.3  PLT 170 160 136*   Basic Metabolic Panel: Recent Labs  Lab 09/22/18 1241 09/22/18 1852 09/23/18 0508  NA 151*  --  149*  K 4.3  --  3.9  CL 114*  --  112*  CO2 18*  --  23  GLUCOSE 119*  --  153*  BUN 25*  --  24*  CREATININE 1.38* 1.02* 1.00  CALCIUM 9.8  --  9.2   GFR: Estimated Creatinine Clearance: 39.2 mL/min (by C-G formula based on SCr of 1 mg/dL). Liver Function Tests: Recent Labs  Lab 09/22/18 1241  AST 63*  ALT 26  ALKPHOS 71  BILITOT 1.2  PROT 7.5  ALBUMIN 2.9*   No results for input(s): LIPASE, AMYLASE in the last 168 hours. Recent Labs  Lab 09/22/18 1607  AMMONIA 38*   Coagulation Profile: No results for input(s): INR, PROTIME in the last 168 hours. Cardiac Enzymes: Recent Labs  Lab 09/22/18 1852  CKTOTAL 194   BNP (last 3 results) Recent Labs    03/16/18 1341  PROBNP 90.0   HbA1C: No results for input(s): HGBA1C in the last 72 hours. CBG: Recent Labs  Lab 09/22/18 1243  GLUCAP 106*   Lipid Profile: No results for input(s): CHOL, HDL, LDLCALC, TRIG, CHOLHDL, LDLDIRECT in the last 72 hours. Thyroid Function Tests: Recent Labs    09/22/18 1915  TSH 2.126   Anemia Panel: No results for input(s): VITAMINB12, FOLATE, FERRITIN, TIBC, IRON, RETICCTPCT in the last 72 hours. Urine analysis:    Component Value Date/Time   COLORURINE AMBER (A) 09/22/2018 1523   APPEARANCEUR HAZY (A) 09/22/2018 1523   LABSPEC 1.026 09/22/2018 1523   PHURINE 5.0 09/22/2018 1523   GLUCOSEU NEGATIVE 09/22/2018 1523   HGBUR NEGATIVE 09/22/2018 1523   BILIRUBINUR NEGATIVE 09/22/2018 1523   KETONESUR 20 (A) 09/22/2018 1523   PROTEINUR 30 (A) 09/22/2018 1523   UROBILINOGEN 1.0 01/30/2013 0322   NITRITE NEGATIVE 09/22/2018 1523   LEUKOCYTESUR NEGATIVE 09/22/2018 1523     Sydnie Sigmund M.D. [facility] 09/24/2018, 11:42 AM  Pager: [phone] Between 7am to 7pm - call Pager - [phone]  After 7pm go to [email or web address] -  password [email or web address]  Call night coverage person covering after 7pm

## 2018-09-24 NOTE — Progress Notes (Signed)
ANTICOAGULATION CONSULT NOTE   Pharmacy Consult for Heparin Indication: LLE DVT  No Known Allergies  Patient Measurements: Height: 5\' 5"  (165.1 cm) Weight: 120 lb 1.6 oz (54.5 kg) IBW/kg (Calculated) : 57 Heparin Dosing Weight: 54.5 kg  Vital Signs: Temp: 98.6 F (37 C) (01/19 1416) Temp Source: Oral (01/19 1416) BP: 112/84 (01/19 1416) Pulse Rate: 86 (01/19 1416)  Labs: Recent Labs    09/22/18 1241 09/22/18 1852 09/23/18 0508  09/24/18 0559 09/24/18 1652 09/24/18 1700 09/24/18 1855  HGB 13.2 12.4 12.4  --   --   --   --   --   HCT 43.9 38.8 37.8  --   --   --   --   --   PLT 170 160 136*  --   --   --   --   --   HEPARINUNFRC  --   --  1.40*   < > 0.91* 1.10*  --  1.09*  CREATININE 1.38* 1.02* 1.00  --   --   --  0.76  --   CKTOTAL  --  194  --   --   --   --   --   --    < > = values in this interval not displayed.   Estimated Creatinine Clearance: 49.1 mL/min (by C-G formula based on SCr of 0.76 mg/dL).  Assessment: [age] yoF who presented on 1/17 with FTT and hypotension. Dopplers showed an acute LLE DVT in the popliteal vein. Pharmacy consulted to start IV heparin. Heparin level remains supratherapeutic this PM at 1.10, actually increased from previous after rate decrease. RN confirmed heparin running at correct rate of 550 units/hr, lab drawn from hand opposite of that which heparin is running in. Repeat heparin level 1.09. Hgb stable 12.4, pltc 136. No bleeding noted, confirmed with RN.   Goal of Therapy:  Heparin level 0.3-0.7 units/ml Monitor platelets by anticoagulation protocol: Yes   Plan:  Hold heparin x1 hour Decrease heparin gtt at 400 units/hr Recheck heparin level in 8 hours after restarting Daily heparin level and CBC Monitor s/sx of bleeding  Heather Streeper N. Zigmund Danieleja, PharmD, BCPS PGY2 Infectious Diseases Pharmacy Resident Phone: [phone] 09/24/2018   7:22 PM

## 2018-09-24 NOTE — Progress Notes (Signed)
ANTICOAGULATION CONSULT NOTE   Pharmacy Consult for Heparin Indication: LLE DVT  No Known Allergies  Patient Measurements: Height: 5\' 5"  (165.1 cm) Weight: 120 lb 1.6 oz (54.5 kg) IBW/kg (Calculated) : 57 Heparin Dosing Weight: 54.5 kg  Vital Signs: Temp: 97.5 F (36.4 C) (01/19 0553) Temp Source: Oral (01/19 0553) BP: 120/85 (01/19 0600) Pulse Rate: 89 (01/19 0555)  Labs: Recent Labs    09/22/18 1241 09/22/18 1852 09/23/18 0508 09/23/18 1607 09/24/18 0559  HGB 13.2 12.4 12.4  --   --   HCT 43.9 38.8 37.8  --   --   PLT 170 160 136*  --   --   HEPARINUNFRC  --   --  1.40* 0.67 0.91*  CREATININE 1.38* 1.02* 1.00  --   --   CKTOTAL  --  194  --   --   --    Estimated Creatinine Clearance: 39.2 mL/min (by C-G formula based on SCr of 1 mg/dL).  Assessment: 55 yoF who presented on 1/17 with FTT and hypotension. Noted advancing dementia and now in wheelchair. Dopplers showed an acute LLE DVT in the popliteal vein. Pharmacy consulted to start IV heparin for anticoagulation. Heparin level supratherapuetic at 0.91. Hgb stable 12.4, pltc 136 - no new labs this AM. No s/sx of bleeding reported.   Goal of Therapy:  Heparin level 0.3-0.7 units/ml Monitor platelets by anticoagulation protocol: Yes   Plan:  Decrease heparin gtt at 550 units/hr Recheck heparin level in 8 hours Daily heparin level and CBC Monitor s/sx of bleeding  Gwynneth Albright, Ilda Basset D PGY1 Pharmacy Resident  Phone [phone] 09/24/2018   9:06 AM

## 2018-09-24 NOTE — Progress Notes (Signed)
Patient only voided 125 mL.  Found 219 mL when bladder scanned. will notify MD.  Will continue to monitor the patient and notify as needed

## 2018-09-24 NOTE — Progress Notes (Signed)
Pt refusing medications PO and oral fluids/food. Pt agitated when needing to turn and position. Little urine output throughout shift.

## 2018-09-25 DIAGNOSIS — E860 Dehydration: Secondary | ICD-10-CM

## 2018-09-25 DIAGNOSIS — I63012 Cerebral infarction due to thrombosis of left vertebral artery: Secondary | ICD-10-CM

## 2018-09-25 LAB — CBC
HCT: 30.8 % — ABNORMAL LOW (ref 36.0–46.0)
HEMOGLOBIN: 9.9 g/dL — AB (ref 12.0–15.0)
MCH: 28 pg (ref 26.0–34.0)
MCHC: 32.1 g/dL (ref 30.0–36.0)
MCV: 87 fL (ref 80.0–100.0)
Platelets: 138 10*3/uL — ABNORMAL LOW (ref 150–400)
RBC: 3.54 MIL/uL — ABNORMAL LOW (ref 3.87–5.11)
RDW: 15.2 % (ref 11.5–15.5)
WBC: 9.6 10*3/uL (ref 4.0–10.5)
nRBC: 0.7 % — ABNORMAL HIGH (ref 0.0–0.2)

## 2018-09-25 LAB — BASIC METABOLIC PANEL
Anion gap: 11 (ref 5–15)
BUN: 12 mg/dL (ref 8–23)
CHLORIDE: 111 mmol/L (ref 98–111)
CO2: 21 mmol/L — ABNORMAL LOW (ref 22–32)
Calcium: 8.2 mg/dL — ABNORMAL LOW (ref 8.9–10.3)
Creatinine, Ser: 0.87 mg/dL (ref 0.44–1.00)
GFR calc Af Amer: >60 mL/min (ref >60)
GFR calc non Af Amer: >60 mL/min (ref >60)
Glucose, Bld: 304 mg/dL — ABNORMAL HIGH (ref 70–99)
POTASSIUM: 3.4 mmol/L — AB (ref 3.5–5.1)
Sodium: 143 mmol/L (ref 135–145)

## 2018-09-25 LAB — HEMOGLOBIN A1C
Hgb A1c MFr Bld: 5.6 % (ref 4.8–5.6)
Mean Plasma Glucose: 114.02 mg/dL

## 2018-09-25 LAB — GLUCOSE, CAPILLARY
Glucose-Capillary: 99 mg/dL (ref 70–99)
Glucose-Capillary: 102 mg/dL — ABNORMAL HIGH (ref 70–99)
Glucose-Capillary: 108 mg/dL — ABNORMAL HIGH (ref 70–99)

## 2018-09-25 LAB — HEPARIN LEVEL (UNFRACTIONATED): Heparin Unfractionated: 0.18 IU/mL — ABNORMAL LOW (ref 0.30–0.70)

## 2018-09-25 MED ORDER — ENOXAPARIN SODIUM 60 MG/0.6ML ~~LOC~~ SOLN
55.0000 mg | Freq: Two times a day (BID) | SUBCUTANEOUS | Status: DC
  Administered 2018-09-25 – 2018-09-27 (×4): 55 mg via SUBCUTANEOUS
  Filled 2018-09-25 (×4): qty 0.6

## 2018-09-25 MED ORDER — VALPROATE SODIUM 500 MG/5ML IV SOLN
500.0000 mg | Freq: Every day | INTRAVENOUS | Status: DC
  Administered 2018-09-25 – 2018-09-29 (×5): 500 mg via INTRAVENOUS
  Filled 2018-09-25 (×6): qty 5

## 2018-09-25 MED ORDER — INSULIN ASPART 100 UNIT/ML ~~LOC~~ SOLN
0.0000 [IU] | SUBCUTANEOUS | Status: DC
  Administered 2018-09-29 – 2018-09-30 (×2): 1 [IU] via SUBCUTANEOUS

## 2018-09-25 NOTE — Care Management Important Message (Signed)
Important Message  Patient Details  Name: Kendra [address] MRN: [id number] Date of Birth: [birth year]   Medicare Important Message Given:  Yes    [name] 09/25/2018, 4:11 PM

## 2018-09-25 NOTE — Progress Notes (Addendum)
Patient refused po medication this AM and oral food/drinks. She is becoming agitated when we approach her. MD notified. Will continue to monitor.

## 2018-09-25 NOTE — Progress Notes (Addendum)
ANTICOAGULATION CONSULT NOTE   Pharmacy Consult for Heparin > Lovenox Indication: LLE DVT  No Known Allergies  Patient Measurements: Height: 5\' 5"  (165.1 cm) Weight: 120 lb 1.6 oz (54.5 kg) IBW/kg (Calculated) : 57 Heparin Dosing Weight: 54.5 kg  Vital Signs: Temp: 98.4 F (36.9 C) (01/20 0555) Temp Source: Oral (01/20 0555) BP: 107/66 (01/20 0555) Pulse Rate: 60 (01/20 0555)  Labs: Recent Labs    09/22/18 1852 09/23/18 0508  09/24/18 1652 09/24/18 1700 09/24/18 1855 09/25/18 1111  HGB 12.4 12.4  --   --   --   --  9.9*  HCT 38.8 37.8  --   --   --   --  30.8*  PLT 160 136*  --   --   --   --  138*  HEPARINUNFRC  --  1.40*   < > 1.10*  --  1.09* 0.18*  CREATININE 1.02* 1.00  --   --  0.76  --  0.87  CKTOTAL 194  --   --   --   --   --   --    < > = values in this interval not displayed.   Estimated Creatinine Clearance: 45.1 mL/min (by C-G formula based on SCr of 0.87 mg/dL).  Assessment: 40 yoF who presented on 1/17 with FTT and hypotension. Dopplers showed an acute LLE DVT in the popliteal vein. Pharmacy consulted to start IV heparin.     Has had several supratherapeutic heparin levels and rate changes. Heparin evel was 1.09 on 550 units/hr last night and drip was held for an hour then resumed at 400 units/hr.  Difficulty obtaining blood draw this am, but heparin level is now subtherapeutic (0.18) on 400 units/hr. Hemoglobin has trended down; platelet count 160>138K. No bleeding reported.  RN reports refused PO meds this am.  Goal of Therapy:  Heparin level 0.3-0.7 units/ml Monitor platelets by anticoagulation protocol: Yes   Plan:   Increase heparin drip to 450 units/hr  Heparin level ~8 hours after rate change, if able.  Daily heparin level and CBC while on heparin.  Monitor s/sx of bleeding  Consider changing to Lovenox since having difficulty with blood draws and not taking PO?  Dennie Fetters, [place] Pager: [phone] or phone: [phone] 09/25/2018    12:49 PM  [place]:   Changing to Lovenox.   Will begin Lovenox 1 mg/kg (55 mg) SQ q12hrs at 6pm   Stop IV heparin at Gaynell Face, RPh 09/25/2018 1:59 PM

## 2018-09-25 NOTE — Progress Notes (Signed)
Heparin drip was D/C at 17:00 o'clock per pharmacy order. Will continue to monitor.

## 2018-09-25 NOTE — Progress Notes (Signed)
[facility]                                                                              Patient Demographics  Kendra Nixon, is a 78 y.o. female, DOB - Mar 25, 1941, MLJ:[id number]  Admit date - 09/22/2018   Admitting Physician  Krystal Eaton, MD  Outpatient Primary MD for the patient is Lance Sell, NP  Outpatient specialists:   LOS - 3  days   Medical records reviewed and are as summarized below:    Chief Complaint  Patient presents with  . Failure To Thrive       Brief summary   Patient is a 78 year old female with history of CVA with right-sided weakness, dementia, HTN, HLP was sent from PCPs office due to failure to thrive, hypotension, agitation.  Patient lives at home with her daughter who has been taking care of her.  Daughter reported that patient has been declining over the past month.  6 weeks ago, she was able to walk some and now she is in wheelchair.  She has been eating less and less in the last 2 to 3 weeks and was unable to take anything oral in last 2 days PTA.  She has also been more lethargic and sleeping more. Daughter reports no nausea vomiting any fever chills or diarrhea.  She also reported waxing and waning mental status changes with confusion and agitation On admission Na 151, creatinine 1.3, AST 63, WBCs 11.6  Assessment & Plan    Principal Problem: Hypernatremia with AKI (acute kidney injury) ([place]), anion gap metabolic acidosis: Likely due to poor oral intake in the last 2 to 3 weeks -Sodium improving, 143 -Continue IV fluids, poor oral intake.  SLP evaluation done, recommended to continue dysphagia 1 pure diet with thin liquids -TSH normal -Currently on D5 half-normal drip, CBGs elevated, placed on sliding scale insulin every 4 hours  Active Problems:   Hyperlipidemia -CK 194 continue statin, patient however not taking any p.o. meds    Essential hypertension -BP currently stable, continue IV fluid hydration    Acute metabolic encephalopathy with dementia ([place]) with failure to thrive -Urine culture showed no growth, DC IV Rocephin -Continue Seroquel.  - CT head showed no acute stroke  History of CVA (cerebral vascular accident) ([place]) with right-sided weakness, wheelchair-bound -Continue aspirin, statin  History of seizures -Patient is not taking anything p.o., gets agitated and refuses oral meds -Change Depakote to IV  Left lower leg wound, acute left lower extremity DVT -LLE duplex showed ac DVT in popliteal vein, for now continue heparin drip until further decision by family regarding goals of care. -Inconsistent lab draws, DC heparin drip and will change to subcu Lovenox  Pressure injury of the skin -Multiple decubitus decub's on right hip, sacral decub, left lower leg, heel in various stages of stage II and stage III -Wound care consult placed  Goals of care -Discussed with daughter at the bedside, she is requesting palliative to revisit her and include her brother in the goals of care meeting Per daughter, met with palliative today,goals of care meeting tomorrow at 10: am  Code Status: Partial with  no CPR and defibrillation otherwise all ACLS protocol DVT Prophylaxis: Heparin drip Family Communication: Discussed in detail and updated patient's daughter at the bedside   Disposition Plan: Will await goals of care  Time Spent in minutes 25 minutes  Procedures:  Doppler ultrasound of the left lower extremity + for acute DVT in the popliteal vein  Consultants:   Palliative medicine  Antimicrobials:   Anti-infectives (From admission, onward)   Start     Dose/Rate Route Frequency Ordered Stop   09/22/18 1800  cefTRIAXone (ROCEPHIN) 1 g in sodium chloride 0.9 % 100 mL IVPB  Status:  Discontinued     1 g 200 mL/hr over 30 Minutes Intravenous Every 24 hours 09/22/18 1727 09/24/18 0902         Medications  Scheduled Meds: . aspirin  325 mg Oral Daily  .  atorvastatin  80 mg Oral QODAY  . feeding supplement (ENSURE ENLIVE)  237 mL Oral BID BM  . multivitamin with minerals  1 tablet Oral Daily  . QUEtiapine  12.5 mg Oral Daily  . QUEtiapine  25 mg Oral QHS   Continuous Infusions: . dextrose 5 % and 0.45% NaCl 100 mL/hr at 09/25/18 1207  . heparin 400 Units/hr (09/24/18 2112)  . valproate sodium     PRN Meds:.acetaminophen **OR** acetaminophen, ondansetron **OR** ondansetron (ZOFRAN) IV      Subjective:   Kiyani Jernigan was seen and examined today.  Somnolent, agitated on exam.  Daughter at the bedside.  Patient is refusing all p.o. meds or diet.  Objective:   Vitals:   09/24/18 1416 09/24/18 1417 09/24/18 2027 09/25/18 0555  BP: 112/84  102/61 107/66  Pulse: 86  75 60  Resp: (!) '24  16 16  ' Temp: 98.6 F (37 C)  98.4 F (36.9 C) 98.4 F (36.9 C)  TempSrc: Oral  Oral Oral  SpO2: (!) 83% 92% 99% 92%  Weight:      Height:        Intake/Output Summary (Last 24 hours) at 09/25/2018 1302 Last data filed at 09/25/2018 0355 Gross per 24 hour  Intake 326.73 ml  Output 100 ml  Net 226.73 ml     Wt Readings from Last 3 Encounters:  09/22/18 54.5 kg  08/07/18 59 kg  03/17/18 55.3 kg    Physical Exam  General: Lethargic, arousable and agitated on examining  Eyes:   HEENT:  Cardiovascular: S1 S2 clear, no murmurs, RRR. No pedal edema b/l  Respiratory: CTAB, no wheezing, rales or rhonchi  Gastrointestinal: Soft, nontender, nondistended, NBS  Ext: no pedal edema bilaterally  Neuro: Noncooperative  Musculoskeletal:   Skin: Multiple decub  Psych: Lethargic   Data Reviewed:  I have personally reviewed following labs and imaging studies  Micro Results Recent Results (from the past 240 hour(s))  Urine Culture     Status: None   Collection Time: 09/22/18  3:23 PM  Result Value Ref Range Status   Specimen Description URINE, CATHETERIZED  Final   Special Requests NONE  Final   Culture   Final    NO  GROWTH Performed at [facility], [address] [place], [place] [address]    Report Status 09/23/2018 FINAL  Final    Radiology Reports Ct Head Wo Contrast  Result Date: 09/22/2018 CLINICAL DATA:  78 year old female with encephalopathy. EXAM: CT HEAD WITHOUT CONTRAST TECHNIQUE: Contiguous axial images were obtained from the base of the skull through the vertex without intravenous contrast. COMPARISON:  Brain MRI dated  09/01/2016 and CT dated 08/31/2016 FINDINGS: Brain: There is moderate age-related atrophy and chronic microvascular ischemic changes. There is no acute intracranial hemorrhage. No mass effect or midline shift. Old left occipital infarct. Vascular: No hyperdense vessel or unexpected calcification. Skull: Normal. Negative for fracture or focal lesion. Sinuses/Orbits: No acute finding. Other: None IMPRESSION: 1. No acute intracranial hemorrhage. 2. Moderate age-related atrophy and chronic microvascular ischemic changes. Old left occipital infarct. Electronically Signed   By: Anner Crete M.D.   On: 09/22/2018 21:23   Dg Chest Port 1 View  Result Date: 09/22/2018 CLINICAL DATA:  Aspiration. EXAM: PORTABLE CHEST 1 VIEW COMPARISON:  Radiographs of August 31, 2016. FINDINGS: No pneumothorax or pleural effusion is noted. Stable cardiomediastinal silhouette. Both lungs are clear. The visualized skeletal structures are unremarkable. IMPRESSION: No acute cardiopulmonary abnormality seen. Electronically Signed   By: Marijo Conception, M.D.   On: 09/22/2018 13:17   Vas [place] Lower Extremity Venous (dvt) (only [facility])  Result Date: 09/24/2018  Lower Venous Study Indications: Swelling, and Edema.  Limitations: Extremely contracted body with stiffness, combativeness. Comparison Study: lower extremity venous duplex exam on 03/21/2018 Performing Technologist: Rudell Cobb  Examination Guidelines: A complete evaluation includes B-mode imaging, spectral Doppler, color Doppler, and power Doppler  as needed of all accessible portions of each vessel. Bilateral testing is considered an integral part of a complete examination. Limited examinations for reoccurring indications may be performed as noted.  Right Venous Findings: +---+---------------+---------+-----------+----------+-------------------------+    CompressibilityPhasicitySpontaneityPropertiesSummary                   +---+---------------+---------+-----------+----------+-------------------------+ CFV                                             unable to obtain due to                                                   extremely contracted                                                      body, patient refused to                                                  coorperate                +---+---------------+---------+-----------+----------+-------------------------+  Left Venous Findings: +---------+---------------+---------+-----------+----------+-------------------+          CompressibilityPhasicitySpontaneityPropertiesSummary             +---------+---------------+---------+-----------+----------+-------------------+ CFV      None                    No                   Chronic             +---------+---------------+---------+-----------+----------+-------------------+ SFJ  Not visualized      +---------+---------------+---------+-----------+----------+-------------------+ FV Prox                                               unable to obtain                                                          due to extremely                                                          contracted body,                                                          patient refused to                                                        coorperate           +---------+---------------+---------+-----------+----------+-------------------+ FV Mid                                                unable to obtain                                                          due to extremely                                                          contracted body,                                                          patient refused to                                                        coorperate          +---------+---------------+---------+-----------+----------+-------------------+  FV Distal                                             unable to obtain                                                          due to extremely                                                          contracted body,                                                          patient refused to                                                        coorperate          +---------+---------------+---------+-----------+----------+-------------------+ PFV                                                   Not visualized      +---------+---------------+---------+-----------+----------+-------------------+ POP      None           No       No                   Acute               +---------+---------------+---------+-----------+----------+-------------------+ PTV                                                   Not visualized      +---------+---------------+---------+-----------+----------+-------------------+ PERO                                                  Not visualized      +---------+---------------+---------+-----------+----------+-------------------+    Summary: Left: Findings consistent with acute deep vein thrombosis involving the left popliteal vein. Findings consistent with chronic deep vein thrombosis involving the left femoral vein.  *See table(s) above for measurements and observations.  Electronically signed by Harold Barban MD on 09/24/2018 at 5:04:08 PM.    Final     Lab Data:  CBC: Recent Labs  Lab 09/22/18 1241 09/22/18 1852 09/23/18 7124  09/25/18 1111  WBC 11.6* 10.2 10.2 9.6  NEUTROABS 8.8*  --   --   --   HGB 13.2 12.4 12.4 9.9*  HCT 43.9 38.8 37.8 30.8*  MCV 95.0 89.6 88.3 87.0  PLT 170 160 136* 179*   Basic Metabolic Panel: Recent Labs  Lab 09/22/18 1241 09/22/18 1852 09/23/18 0508 09/24/18 1700 09/25/18 1111  NA 151*  --  149* 145 143  K 4.3  --  3.9 3.9 3.4*  CL 114*  --  112* 115* 111  CO2 18*  --  23 19* 21*  GLUCOSE 119*  --  153* 138* 304*  BUN 25*  --  24* 15 12  CREATININE 1.38* 1.02* 1.00 0.76 0.87  CALCIUM 9.8  --  9.2 8.6* 8.2*   GFR: Estimated Creatinine Clearance: 45.1 mL/min (by C-G formula based on SCr of 0.87 mg/dL). Liver Function Tests: Recent Labs  Lab 09/22/18 1241  AST 63*  ALT 26  ALKPHOS 71  BILITOT 1.2  PROT 7.5  ALBUMIN 2.9*   No results for input(s): LIPASE, AMYLASE in the last 168 hours. Recent Labs  Lab 09/22/18 1607  AMMONIA 38*   Coagulation Profile: No results for input(s): INR, PROTIME in the last 168 hours. Cardiac Enzymes: Recent Labs  Lab 09/22/18 1852  CKTOTAL 194   BNP (last 3 results) Recent Labs    03/16/18 1341  PROBNP 90.0   HbA1C: No results for input(s): HGBA1C in the last 72 hours. CBG: Recent Labs  Lab 09/22/18 1243  GLUCAP 106*   Lipid Profile: No results for input(s): CHOL, HDL, LDLCALC, TRIG, CHOLHDL, LDLDIRECT in the last 72 hours. Thyroid Function Tests: Recent Labs    09/22/18 1915  TSH 2.126   Anemia Panel: No results for input(s): VITAMINB12, FOLATE, FERRITIN, TIBC, IRON, RETICCTPCT in the last 72 hours. Urine analysis:    Component Value Date/Time   COLORURINE AMBER (A) 09/22/2018 1523   APPEARANCEUR HAZY (A) 09/22/2018 1523   LABSPEC 1.026 09/22/2018 1523   PHURINE 5.0 09/22/2018 1523   GLUCOSEU NEGATIVE 09/22/2018 1523   HGBUR NEGATIVE  09/22/2018 1523   BILIRUBINUR NEGATIVE 09/22/2018 1523   KETONESUR 20 (A) 09/22/2018 1523   PROTEINUR 30 (A) 09/22/2018 1523   UROBILINOGEN 1.0 01/30/2013 0322   NITRITE NEGATIVE 09/22/2018 1523   LEUKOCYTESUR NEGATIVE 09/22/2018 1523       M.D. [facility] 09/25/2018, 1:02 PM  Pager: [phone] Between 7am to 7pm - call Pager - [phone]  After 7pm go to [email or web address] - password [email or web address]  Call night coverage person covering after 7pm

## 2018-09-25 NOTE — Progress Notes (Signed)
Palliative:  HPI: 78 yo female with PMH of dementia, CVA with residual right-sided weakness, HTN admitted 1/17/20202 with failure to thrive and hypotension and hypernatremia with acute kidney injury improving with fluids. No reversible cause found and decline likely from failure to thrive with worsening dementia.   I met today at Ms. Dhillon's bedside with daughter, Verdene Lennert. Verdene Lennert was under the impression that palliative had a meeting to discuss with she and her siblings over the weekend but this was never planned by my team. I apologized for the confusion and we discussed a conversation with herself and her siblings via telephone to discuss [place]. Discussed times available today and tomorrow. She will discuss with her siblings and let me know.   We also discussed her mother's decline. Verdene Lennert says that family has brought up the subject of a feeding tube. I explained to [place] that feeding tubes are not recommended with dementia as they are not found to improve quality or quantity of life. I also explained concerns with aspiration and other risks vs benefits of artificial feeding.  Verdene Lennert says that her grandmother had dementia and lived a few years with feeding tube. Verdene Lennert clarifies that she (and 1 brother) is prepared for comfort care but that their 1 brother is not. This will need to be further discussed with all children. She does not want her brother to blame herself and her other brother for their mother's death so she does not want to make a decision for comfort without his approval. Emotional support provided.   Exam: Somnolent. Cried out with movement of her head but immediately back to sleep. No other response. No distress. Not alert enough for intake.   Plan: - Updated that family wish to meet tomorrow 09/26/18 at 1000 am. Hopeful for clarification of GOC at this time.   27 min  Vinie Sill, NP Palliative Medicine Team Pager # [phone] (M-F 8a-5p) Team Phone # [phone]  (Nights/Weekends)

## 2018-09-26 ENCOUNTER — Inpatient Hospital Stay (HOSPITAL_COMMUNITY): Payer: Medicare Other

## 2018-09-26 DIAGNOSIS — E860 Dehydration: Secondary | ICD-10-CM

## 2018-09-26 LAB — CBC
HEMATOCRIT: 31.5 % — AB (ref 36.0–46.0)
Hemoglobin: 10.1 g/dL — ABNORMAL LOW (ref 12.0–15.0)
MCH: 27.8 pg (ref 26.0–34.0)
MCHC: 32.1 g/dL (ref 30.0–36.0)
MCV: 86.8 fL (ref 80.0–100.0)
Platelets: 177 10*3/uL (ref 150–400)
RBC: 3.63 MIL/uL — ABNORMAL LOW (ref 3.87–5.11)
RDW: 15.3 % (ref 11.5–15.5)
WBC: 8.6 10*3/uL (ref 4.0–10.5)
nRBC: 0.5 % — ABNORMAL HIGH (ref 0.0–0.2)

## 2018-09-26 LAB — BASIC METABOLIC PANEL
Anion gap: 9 (ref 5–15)
BUN: 10 mg/dL (ref 8–23)
CO2: 22 mmol/L (ref 22–32)
Calcium: 8.7 mg/dL — ABNORMAL LOW (ref 8.9–10.3)
Chloride: 110 mmol/L (ref 98–111)
Creatinine, Ser: 0.71 mg/dL (ref 0.44–1.00)
GFR calc non Af Amer: >60 mL/min (ref >60)
Glucose, Bld: 127 mg/dL — ABNORMAL HIGH (ref 70–99)
Potassium: 3.4 mmol/L — ABNORMAL LOW (ref 3.5–5.1)
Sodium: 141 mmol/L (ref 135–145)

## 2018-09-26 LAB — GLUCOSE, CAPILLARY
GLUCOSE-CAPILLARY: 112 mg/dL — AB (ref 70–99)
Glucose-Capillary: 101 mg/dL — ABNORMAL HIGH (ref 70–99)
Glucose-Capillary: 102 mg/dL — ABNORMAL HIGH (ref 70–99)
Glucose-Capillary: 102 mg/dL — ABNORMAL HIGH (ref 70–99)
Glucose-Capillary: 104 mg/dL — ABNORMAL HIGH (ref 70–99)
Glucose-Capillary: 105 mg/dL — ABNORMAL HIGH (ref 70–99)

## 2018-09-26 MED ORDER — POTASSIUM CHLORIDE 10 MEQ/100ML IV SOLN
10.0000 meq | INTRAVENOUS | Status: AC
  Administered 2018-09-26 (×2): 10 meq via INTRAVENOUS
  Filled 2018-09-26 (×2): qty 100

## 2018-09-26 NOTE — Plan of Care (Signed)
Possible home with hospice or placement of PEG tube and tube feedings started.  Problem: Education: Goal: Knowledge of General Education information will improve Description Including pain rating scale, medication(s)/side effects and non-pharmacologic comfort measures Outcome: Not Progressing   Problem: Health Behavior/Discharge Planning: Goal: Ability to manage health-related needs will improve Outcome: Not Progressing   Problem: Clinical Measurements: Goal: Ability to maintain clinical measurements within normal limits will improve Outcome: Not Progressing Goal: Will remain free from infection Outcome: Not Progressing Goal: Diagnostic test results will improve Outcome: Not Progressing Goal: Cardiovascular complication will be avoided Outcome: Not Progressing   Problem: Activity: Goal: Risk for activity intolerance will decrease Outcome: Not Progressing   Problem: Nutrition: Goal: Adequate nutrition will be maintained Outcome: Not Progressing   Problem: Coping: Goal: Level of anxiety will decrease Outcome: Not Progressing   Problem: Elimination: Goal: Will not experience complications related to bowel motility Outcome: Not Progressing Goal: Will not experience complications related to urinary retention Outcome: Not Progressing   Problem: Skin Integrity: Goal: Risk for impaired skin integrity will decrease Outcome: Not Progressing

## 2018-09-26 NOTE — Progress Notes (Signed)
  Speech Language Pathology Treatment: Dysphagia  Patient Details Name: Kendra Nixon MRN: [id number] DOB: 06-Jul-1941 Today's Date: 09/26/2018 Time: 1250-1300 SLP Time Calculation (min) (ACUTE ONLY): 10 min  Assessment / Plan / Recommendation Clinical Impression   Patient refused to eat, feel back to sleep easily. Spoke with nursing about family goals of care meeting. Family is considering a Peg tube placement. Patient continues to refuse to eat for staff, family and to take medications. Speech therapy to sign off at this time. Please re-consult if needed.    HPI HPI: 78 year old female with history of CVA with right-sided weakness, dementia, hypertension hyperlipidemia was seen at her PCPs office today.  Patient lives at home with her daughter who has been taking care of her.       SLP Plan  Discharge SLP treatment due to (comment)(pt refuses to eat, family considering Peg tube)       Recommendations  Diet recommendations: Dysphagia 1 (puree);Thin liquid Medication Administration: Crushed with puree Supervision: Full supervision/cueing for compensatory strategies                Oral Care Recommendations: Oral care BID Follow up Recommendations: 24 hour supervision/assistance SLP Visit Diagnosis: Dysphagia, oropharyngeal phase (R13.12) Plan: Discharge SLP treatment due to (comment)(pt refuses to eat, family considering Peg tube)       GO                Kendra Hose [name], MA, CCC-SLP 09/26/2018 1:03 PM

## 2018-09-26 NOTE — Progress Notes (Signed)
In/out cath completed. 381 noted. RN to continue to monitor.

## 2018-09-26 NOTE — Progress Notes (Signed)
Palliative:  Kendra Nixon is lying in bed in a somewhat fetal position with her head against the side rail.  She does not allow me to adjust her head for comfort instead hollering out "leave me alone".  Present today at bedside is daughter Kendra Nixon.  We leave the room to plan for a phone conference with her brothers Kendra Nixon and Kendra Nixon, but there is not a room available with the phone.  Scotty is able to conference Kendra Nixon and call Kendra Nixon, and conference is held at Kendra Nixon's bedside.    We discussed Kendra Nixon's chronic health concerns and her acute health problems.  We talked about the dementia pathway, what is normal and expected with mental status changes, physical changes, nutritional changes.  Scotty asks about how to care for his mother if she is not eating, he states that he feels that she would be starved.  We talked about the risks of PEG tube placement.  Kendra Nixon states that her mother's eyes are more open than they have been in the last few days, she feels that her mother is improving.  Kendra Nixon states she can see that her mother is more alert today and tells me that she has changed her thoughts and would like for Kendra Nixon to evaluated for a PEG tube.  I share that the physicians decide if she is a candidate, I share that Kendra Nixon may not be a candidate for PEG tube placement.  They state understanding.  We talked about her DVT and Lovenox injections.  I share that family will be expected to administer this sometimes painful injection.  Kendra Nixon states that she was hopeful that hospice would give this medication.  I share that hospice usually does not do medications or treatments that are painful unless this is what the patient wishes.  I share that it is unusual for hospice to take a patient with tube feeding.  I asked what gives Kendra Nixon pleasure, family states, "sleeping".  They share that her normal state was to go to bed at 3:00 or 4:00 in the afternoon.  I open the discussion with  family about "are we doing something for Kendra Nixon or to her", can we change what is happening.  I share that modern medicine can prolong life, but can also prolong the dying process and thereby suffering.  Family states that they would like a trial of PEG tube and feeding, and if after 2 weeks there is no improvement they would agree to have the tube feeding DC'd and go with hospice care.    Conference with hospitalist related to goals of care discussion, family's desire for PEG tube evaluation to extend Kendra Nixon's life no matter her quality.  PMT endorses comfort and dignity, let nature take its course for Kendra Nixon, but family states they would like to extend her life, at any cost.  75 minutes, extended time Kendra Carmel, NP  Palliative Medicine Team  Team Phone # [phone]  Greater than 50% of this time was spent counseling and coordinating care related to the above assessment and plan.

## 2018-09-26 NOTE — Progress Notes (Signed)
Pt noted to urinate X 1, bladder scan completed and 382 noted. MD notified. If in/out ordered, this will be the 2nd night in a row that we have done an in/out cath. RN to continue to monitor.

## 2018-09-26 NOTE — Progress Notes (Addendum)
[facility]                                                                              Patient Demographics  Kendra Nixon, is a 78 y.o. female, DOB - 17-Feb-1941, UJW:[id number]  Admit date - 09/22/2018   Admitting Physician Merik Mignano Jenna Luo, MD  Outpatient Primary MD for the patient is Evaristo Bury, NP  Outpatient specialists:   LOS - 4  days   Medical records reviewed and are as summarized below:    Chief Complaint  Patient presents with  . Failure To Thrive       Brief summary   Patient is a 78 year old female with history of CVA with right-sided weakness, dementia, HTN, HLP was sent from PCPs office due to failure to thrive, hypotension, agitation.  Patient lives at home with her daughter who has been taking care of her.  Daughter reported that patient has been declining over the past month.  6 weeks ago, she was able to walk some and now she is in wheelchair.  She has been eating less and less in the last 2 to 3 weeks and was unable to take anything oral in last 2 days PTA.  She has also been more lethargic and sleeping more. Daughter reports no nausea vomiting any fever chills or diarrhea.  She also reported waxing and waning mental status changes with confusion and agitation On admission Na 151, creatinine 1.3, AST 63, WBCs 11.6  Assessment & Plan    Principal Problem: Hypernatremia with AKI (acute kidney injury) (HCC), anion gap metabolic acidosis: Likely due to poor oral intake in the last 2 to 3 weeks -Borderline BP, sodium improving 141.  Sodium 151 at the time of admission. -Continue IV fluids, poor oral intake.  SLP evaluation done, recommended to continue dysphagia 1 pure diet with thin liquids -TSH normal -Currently on D5 half-normal drip  Active Problems:   Hyperlipidemia -CK 194 continue statin, patient however not taking any p.o. meds    Essential hypertension -BP currently stable, continue IV fluid hydration   Acute  metabolic encephalopathy with dementia (HCC) with failure to thrive -Urine culture showed no growth, DC IV Rocephin -Continue Seroquel.  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 -Inconsistent lab draws, heparin drip discontinued, changed to subcutaneous Lovenox   Pressure injury of the skin -Multiple decubitus decub's on right hip, sacral decub, left lower leg, heel in various stages of stage II and stage III -Wound care consult placed  Goals of care -Discussed with daughter at the bedside yesterday, she is requesting palliative to revisit her and include her brother in the goals of care meeting -Goals of care meeting planned today Addendum: Goals of care done today, family/younger son now wants feeding tube  as he feels that his mother is starving. Palliative NP and I had explained to the patient's daughter multiple times that feeding tube will likely not improve the quality of life, patient has advanced dementia, multiple decub's, failure to thrive, poor quality of life prior to admission -IR consult placed for feeding tube  Code Status: Partial with no CPR and defibrillation otherwise all ACLS protocol DVT Prophylaxis: Heparin drip Family Communication: No family member at the bedside, goals of care meeting planned today   Disposition Plan: Will await goals of care  Time Spent in minutes 25 minutes  Procedures:  Doppler ultrasound of the left lower extremity + for acute DVT in the popliteal vein  Consultants:   Palliative medicine  Antimicrobials:    Anti-infectives (From admission, onward)   Start     Dose/Rate Route Frequency Ordered Stop   09/22/18 1800  cefTRIAXone (ROCEPHIN) 1 g in sodium chloride 0.9 % 100 mL IVPB  Status:  Discontinued     1 g 200 mL/hr over 30 Minutes Intravenous Every 24 hours 09/22/18 1727 09/24/18 0902         Medications  Scheduled Meds: . aspirin  325 mg Oral Daily  . atorvastatin  80 mg Oral QODAY  . enoxaparin (LOVENOX) injection  55 mg Subcutaneous Q12H  . feeding supplement (ENSURE ENLIVE)  237 mL Oral BID BM  . insulin aspart  0-9 Units Subcutaneous Q4H  . multivitamin with minerals  1 tablet Oral Daily  . QUEtiapine  12.5 mg Oral Daily  . QUEtiapine  25 mg Oral QHS   Continuous Infusions: . dextrose 5 % and 0.45% NaCl 100 mL/hr at 09/26/18 1001  . valproate sodium 500 mg (09/26/18 1003)   PRN Meds:.acetaminophen **OR** acetaminophen, ondansetron **OR** ondansetron (ZOFRAN) IV      Subjective:   Kendra Nixon was seen and examined today.  No acute issues, gets agitated on examination.  Refusing p.o. meds.  Objective:   Vitals:   09/25/18 1458 09/25/18 1558 09/26/18 0254 09/26/18 0641  BP: (!) 82/67 124/74 117/69 96/79  Pulse: 71 79 68 77  Resp: 18  16   Temp: 97.9 F (36.6 C)  97.8 F (36.6 C) 98 F (36.7 C)  TempSrc: Oral  Oral   SpO2:  100% 100% 92%  Weight:      Height:        Intake/Output Summary (Last 24 hours) at 09/26/2018 1038 Last data filed at 09/26/2018 0911 Gross per 24 hour  Intake 1796.1 ml  Output 380 ml  Net 1416.1 ml     Wt Readings from Last 3 Encounters:  09/22/18 54.5 kg  08/07/18 59 kg  03/17/18 55.3 kg    Physical Exam  General: Somnolent but easily arousable, gets agitated on examination, appears comfortable  Eyes:   HEENT:  Atraumatic, normocephalic  Cardiovascular: S1 S2 clear, RRR. No pedal edema b/l  Respiratory: CTAB, no wheezing, rales or rhonchi  Gastrointestinal: Soft, nontender, nondistended, NBS  Ext: no pedal  edema bilaterally  Neuro: no new deficits, does not follow commands  Musculoskeletal: No cyanosis, clubbing  Skin: Multiple decub ulcers  Psych: Confused, somnolent    Data Reviewed:  I have personally reviewed following labs and imaging studies  Micro Results Recent Results (from the past 240 hour(s))  Urine Culture     Status: None   Collection Time: 09/22/18  3:23 PM  Result Value Ref Range Status   Specimen Description URINE, CATHETERIZED  Final   Special Requests NONE  Final   Culture   Final    NO GROWTH Performed at [facility], [address] [place], [place] [address]    Report Status 09/23/2018 FINAL  Final    Radiology Reports Ct Head Wo Contrast  Result Date: 09/22/2018 CLINICAL DATA:  78 year old female with encephalopathy. EXAM: CT HEAD WITHOUT CONTRAST TECHNIQUE: Contiguous axial images were obtained from the base of the skull through the vertex without intravenous contrast. COMPARISON:  Brain MRI dated 09/01/2016 and CT dated 08/31/2016 FINDINGS: Brain: There is moderate age-related atrophy and chronic microvascular ischemic changes. There is no acute intracranial hemorrhage. No mass effect or midline shift. Old left occipital infarct. Vascular: No hyperdense vessel or unexpected calcification. Skull: Normal. Negative for fracture or focal lesion. Sinuses/Orbits: No acute finding. Other: None IMPRESSION: 1. No acute intracranial hemorrhage. 2. Moderate age-related atrophy and chronic microvascular ischemic changes. Old left occipital infarct. Electronically Signed   By: Elgie CollardArash  Radparvar M.D.   On: 09/22/2018 21:23   Dg Chest Port 1 View  Result Date: 09/22/2018 CLINICAL DATA:  Aspiration. EXAM: PORTABLE CHEST 1 VIEW COMPARISON:  Radiographs of August 31, 2016. FINDINGS: No pneumothorax or pleural effusion is noted. Stable cardiomediastinal silhouette. Both lungs are clear. The visualized skeletal structures are unremarkable. IMPRESSION: No acute  cardiopulmonary abnormality seen. Electronically Signed   By: Lupita RaiderJames  Green Jr, M.D.   On: 09/22/2018 13:17   Vas [place]as Lower Extremity Venous (dvt) (only [facility])  Result Date: 09/24/2018  Lower Venous Study Indications: Swelling, and Edema.  Limitations: Extremely contracted body with stiffness, combativeness. Comparison Study: lower extremity venous duplex exam on 03/21/2018 Performing Technologist: Melodie BouillonSelina Cole  Examination Guidelines: A complete evaluation includes B-mode imaging, spectral Doppler, color Doppler, and power Doppler as needed of all accessible portions of each vessel. Bilateral testing is considered an integral part of a complete examination. Limited examinations for reoccurring indications may be performed as noted.  Right Venous Findings: +---+---------------+---------+-----------+----------+-------------------------+    CompressibilityPhasicitySpontaneityPropertiesSummary                   +---+---------------+---------+-----------+----------+-------------------------+ CFV                                             unable to obtain due to                                                   extremely contracted                                                      body, patient refused to                                                  coorperate                +---+---------------+---------+-----------+----------+-------------------------+  Left Venous Findings: +---------+---------------+---------+-----------+----------+-------------------+          CompressibilityPhasicitySpontaneityPropertiesSummary             +---------+---------------+---------+-----------+----------+-------------------+  CFV      None                    No                   Chronic             +---------+---------------+---------+-----------+----------+-------------------+ SFJ                                                   Not visualized       +---------+---------------+---------+-----------+----------+-------------------+ FV Prox                                               unable to obtain                                                          due to extremely                                                          contracted body,                                                          patient refused to                                                        coorperate          +---------+---------------+---------+-----------+----------+-------------------+ FV Mid                                                unable to obtain                                                          due to extremely                                                          contracted body,  patient refused to                                                        coorperate          +---------+---------------+---------+-----------+----------+-------------------+ FV Distal                                             unable to obtain                                                          due to extremely                                                          contracted body,                                                          patient refused to                                                        coorperate          +---------+---------------+---------+-----------+----------+-------------------+ PFV                                                   Not visualized      +---------+---------------+---------+-----------+----------+-------------------+ POP      None           No       No                   Acute               +---------+---------------+---------+-----------+----------+-------------------+ PTV                                                   Not visualized       +---------+---------------+---------+-----------+----------+-------------------+ PERO                                                  Not visualized      +---------+---------------+---------+-----------+----------+-------------------+  Summary: Left: Findings consistent with acute deep vein thrombosis involving the left popliteal vein. Findings consistent with chronic deep vein thrombosis involving the left femoral vein.  *See table(s) above for measurements and observations. Electronically signed by Coral Else MD on 09/24/2018 at 5:04:08 PM.    Final     Lab Data:  CBC: Recent Labs  Lab 09/22/18 1241 09/22/18 1852 09/23/18 0508 09/25/18 1111 09/26/18 0502  WBC 11.6* 10.2 10.2 9.6 8.6  NEUTROABS 8.8*  --   --   --   --   HGB 13.2 12.4 12.4 9.9* 10.1*  HCT 43.9 38.8 37.8 30.8* 31.5*  MCV 95.0 89.6 88.3 87.0 86.8  PLT 170 160 136* 138* 177   Basic Metabolic Panel: Recent Labs  Lab 09/22/18 1241 09/22/18 1852 09/23/18 0508 09/24/18 1700 09/25/18 1111 09/26/18 0502  NA 151*  --  149* 145 143 141  K 4.3  --  3.9 3.9 3.4* 3.4*  CL 114*  --  112* 115* 111 110  CO2 18*  --  23 19* 21* 22  GLUCOSE 119*  --  153* 138* 304* 127*  BUN 25*  --  24* 15 12 10   CREATININE 1.38* 1.02* 1.00 0.76 0.87 0.71  CALCIUM 9.8  --  9.2 8.6* 8.2* 8.7*   GFR: Estimated Creatinine Clearance: 49.1 mL/min (by C-G formula based on SCr of 0.71 mg/dL). Liver Function Tests: Recent Labs  Lab 09/22/18 1241  AST 63*  ALT 26  ALKPHOS 71  BILITOT 1.2  PROT 7.5  ALBUMIN 2.9*   No results for input(s): LIPASE, AMYLASE in the last 168 hours. Recent Labs  Lab 09/22/18 1607  AMMONIA 38*   Coagulation Profile: No results for input(s): INR, PROTIME in the last 168 hours. Cardiac Enzymes: Recent Labs  Lab 09/22/18 1852  CKTOTAL 194   BNP (last 3 results) Recent Labs    03/16/18 1341  PROBNP 90.0   HbA1C: Recent Labs    09/25/18 1307  HGBA1C 5.6   CBG: Recent Labs  Lab  09/25/18 1709 09/25/18 2042 09/26/18 0018 09/26/18 0431 09/26/18 0806  GLUCAP 102* 108* 104* 112* 105*   Lipid Profile: No results for input(s): CHOL, HDL, LDLCALC, TRIG, CHOLHDL, LDLDIRECT in the last 72 hours. Thyroid Function Tests: No results for input(s): TSH, T4TOTAL, FREET4, T3FREE, THYROIDAB in the last 72 hours. Anemia Panel: No results for input(s): VITAMINB12, FOLATE, FERRITIN, TIBC, IRON, RETICCTPCT in the last 72 hours. Urine analysis:    Component Value Date/Time   COLORURINE AMBER (A) 09/22/2018 1523   APPEARANCEUR HAZY (A) 09/22/2018 1523   LABSPEC 1.026 09/22/2018 1523   PHURINE 5.0 09/22/2018 1523   GLUCOSEU NEGATIVE 09/22/2018 1523   HGBUR NEGATIVE 09/22/2018 1523   BILIRUBINUR NEGATIVE 09/22/2018 1523   KETONESUR 20 (A) 09/22/2018 1523   PROTEINUR 30 (A) 09/22/2018 1523   UROBILINOGEN 1.0 01/30/2013 0322   NITRITE NEGATIVE 09/22/2018 1523   LEUKOCYTESUR NEGATIVE 09/22/2018 1523     Kendra Nixon M.D. [facility] 09/26/2018, 10:38 AM  Pager: [phone] Between 7am to 7pm - call Pager - [phone]  After 7pm go to [email or web address] - password [email or web address]  Call night coverage person covering after 7pm

## 2018-09-26 NOTE — Care Management Note (Signed)
Case Management Note  Patient Details  Name: Kendra Nixon MRN: [id number] Date of Birth: Oct 31, 1940  Subjective/Objective:      Admitted with  FTT, hx of CVA with right-sided weakness, dementia, hypertension hyperlipidemia. From home with daughter, Kendra Nixon.        [name] (Daughter) Clauda Roe (Son)     [phone]      PCP: Alphonse Guild  Action/Plan: Palliative evaluation pending... NCM following for [facility] needs  Expected Discharge Date:                  Expected Discharge Plan:     In-House Referral:     Discharge planning Services     Post Acute Care Choice:    Choice offered to:     DME Arranged:    DME Agency:     HH Arranged:    HH Agency:     Status of Service:  In process, will continue to follow  If discussed at Long Length of Stay Meetings, dates discussed:    Additional Comments:  Epifanio Lesches, RN 09/26/2018, 10:55 AM

## 2018-09-27 ENCOUNTER — Encounter (HOSPITAL_COMMUNITY): Payer: Self-pay | Admitting: Physician Assistant

## 2018-09-27 LAB — BASIC METABOLIC PANEL
Anion gap: 9 (ref 5–15)
BUN: 7 mg/dL — ABNORMAL LOW (ref 8–23)
CO2: 20 mmol/L — ABNORMAL LOW (ref 22–32)
Calcium: 8.7 mg/dL — ABNORMAL LOW (ref 8.9–10.3)
Chloride: 110 mmol/L (ref 98–111)
Creatinine, Ser: 0.7 mg/dL (ref 0.44–1.00)
GFR calc Af Amer: >60 mL/min (ref >60)
Glucose, Bld: 107 mg/dL — ABNORMAL HIGH (ref 70–99)
Potassium: 3.7 mmol/L (ref 3.5–5.1)
Sodium: 139 mmol/L (ref 135–145)

## 2018-09-27 LAB — GLUCOSE, CAPILLARY
GLUCOSE-CAPILLARY: 96 mg/dL (ref 70–99)
Glucose-Capillary: 101 mg/dL — ABNORMAL HIGH (ref 70–99)
Glucose-Capillary: 101 mg/dL — ABNORMAL HIGH (ref 70–99)
Glucose-Capillary: 103 mg/dL — ABNORMAL HIGH (ref 70–99)
Glucose-Capillary: 95 mg/dL (ref 70–99)
Glucose-Capillary: 117 mg/dL — ABNORMAL HIGH (ref 70–99)

## 2018-09-27 MED ORDER — ENOXAPARIN SODIUM 60 MG/0.6ML ~~LOC~~ SOLN
55.0000 mg | Freq: Two times a day (BID) | SUBCUTANEOUS | Status: DC
  Administered 2018-09-28 – 2018-09-29 (×2): 55 mg via SUBCUTANEOUS
  Filled 2018-09-27 (×2): qty 0.6

## 2018-09-27 NOTE — Consult Note (Signed)
Chief Complaint: Patient was seen in consultation today for gastrostomy tube placement,   Referring Physician(s): Dr. Isidoro Donning  Supervising Physician: Jolaine Click  Patient Status: [facility] - In-pt  History of Present Illness: Kendra Nixon is a 78 y.o. female with a past medical history significant for arthritis, CVA with right sided hemiplegia, HLD, HTN and advanced dementia who presented to [facility] ED on 09/22/18 from her PCPs office due to worsening failure to thrive. Her daughter reported that she appeared to be choking on her food and was eating less and less each day. On admission to the hospital she was found to have AKI and hypernatremia for which she was admitted for supportive care. During her admission her daughter reports that her appetite has waxed and waned, initially she did not want to eat much or get out of bed however now after IVF and supportive care of AKI her appetite appears to have returned and she wants to get out of bed more, just like she used to. Despite her recent improvement and several conversations with various providers her daughter still wishes to have the gastrotomy tube placed prior to discharge home - she understands that g-tube placement will not necessarily improve the quality of life and in fact could cause worsening problems at home (infection, dislodgement, etc.), she wishes to have any intervention done which would prolong her mother's life and is certain she would like to proceed with g-tube placement. She tells me her aunt previously took care of another family member with a g-tube so she is familiar with care/use and plans to take care of the g-tube herself, possibly with hospice as well.  Past Medical History:  Diagnosis Date  . ACE-inhibitor cough   . AKI (acute kidney injury) (HCC) 09/2018  . Arthritis    "all over" (09/01/2016)  . CVA (cerebral vascular accident) (HCC)    Old left basal ganglion lacunar infarct noted per CT head (01/2013)  . CVA (cerebral  vascular accident) (HCC) 08/31/2016   "verbal before; nonverbal now; weak all over now" (09/01/2016)  . Dementia (HCC)    Kendra Nixon 08/31/2016  . Hyperlipidemia LDL goal < 100   . Hypertension   . Solitary lung nodule    4 mm right upper lobe nodule  . UTI (urinary tract infection) 08/31/2016   Kendra Nixon 08/31/2016    Past Surgical History:  Procedure Laterality Date  . BREAST LUMPECTOMY Bilateral 1960   "benign"  . FEMUR IM NAIL Right 08/16/2014   Procedure: INTRAMEDULLARY (IM) NAIL FEMORAL;  Surgeon: Sheral Apley, MD;  Location: [facility] OR;  Service: Orthopedics;  Laterality: Right;  . FRACTURE SURGERY      Allergies: Patient has no known allergies.  Medications: Prior to Admission medications   Medication Sig Start Date End Date Taking? Authorizing Provider  amLODipine (NORVASC) 10 MG tablet TAKE 1 TABLET BY MOUTH ONCE DAILY 07/10/18  Yes Evaristo Bury, NP  aspirin 325 MG tablet Take 1 tablet (325 mg total) by mouth daily. 09/03/16  Yes Dhungel, Nishant, MD  atorvastatin (LIPITOR) 80 MG tablet Take 1 tablet (80 mg total) by mouth every other day. 08/07/18  Yes Evaristo Bury, NP  divalproex (DEPAKOTE ER) 250 MG 24 hr tablet Take 2 tablets every evening 07/25/18  Yes Van Clines, MD  Menthol-Methyl Salicylate (MUSCLE RUB) 10-15 % CREA Apply 1 application topically as needed for muscle pain (shoulder pain).   Yes [provider]  Multiple Vitamin (MULTIVITAMIN) tablet Take 1 tablet by mouth daily.  Yes [provider]  QUEtiapine (SEROQUEL) 25 MG tablet Take 1/2 tablet in AM, then 1.5 tablets every night 03/17/18  Yes Van ClinesAquino, Karen M, MD     Family History  Problem Relation Age of Onset  . Breast cancer Sister [id number]  . Hypertension Other   . Alzheimer's disease Maternal Grandmother   . Alzheimer's disease Maternal Aunt   . Arthritis Mother   . Heart disease Mother   . Heart disease Father     Social History   Socioeconomic History  . Marital  status: Widowed    Spouse name: Not on file  . Number of children: 3  . Years of education: 11th grade  . Highest education level: Not on file  Occupational History  . 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 Financial resource strain: Not on file  . Food insecurity:    Worry: Not on file    Inability: Not on file  . Transportation needs:    Medical: Not on file    Non-medical: Not on file  Tobacco Use  . Smoking status: Former Smoker    Packs/day: 0.10    Years: 30.00    Pack years: 3.00    Types: Cigarettes  . Smokeless tobacco: Never Used  Substance and Sexual Activity  . Alcohol use: No  . Drug use: No  . Sexual activity: Not on file  Lifestyle  . Physical activity:    Days per week: Not on file    Minutes per session: Not on file  . Stress: Not on file  Relationships  . Social connections:    Talks on phone: Not on file    Gets together: Not on file    Attends religious service: Not on file    Active member of club or organization: Not on file    Attends meetings of clubs or organizations: Not on file    Relationship status: Not on file  Other Topics Concern  . 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  All other systems are negative.  Review of Systems  Unable to perform ROS: Dementia    Vital Signs: BP 133/80 (BP Location: Left Arm)   Pulse 69   Temp (!) 97.4 F (36.3 C) (Oral)   Resp (!) 24   Ht 5\' 5"  (1.651 m)   Wt 120 lb 1.6 oz (54.5 kg)   SpO2 97%   BMI 19.99 kg/m   Physical Exam Vitals signs and nursing note reviewed.  Constitutional:      General: She is not in acute distress.    Comments: Sitting in  recliner, daughter feeding patient applesauce and ensure during exam. Patient laughs intermittently and mumbles at time. Does not appear to be in distress/pain.  HENT:     Head: Normocephalic.  Cardiovascular:     Rate and Rhythm: Normal rate and regular rhythm.  Pulmonary:     Effort: Pulmonary effort is normal.     Breath sounds: Normal breath sounds.  Abdominal:     General: There is no distension.     Palpations: Abdomen is soft.  Tenderness: There is no abdominal tenderness.  Skin:    General: Skin is warm and dry.  Neurological:     Mental Status: She is alert. Mental status is at baseline.  Psychiatric:        Mood and Affect: Mood normal.        Behavior: Behavior normal.        Thought Content: Thought content normal.        Judgment: Judgment normal.      MD Evaluation Airway: WNL Heart: WNL Abdomen: WNL Chest/ Lungs: WNL ASA  Classification: 3 Mallampati/Airway Score: Two   Imaging: Ct Abdomen Wo Contrast  Result Date: 09/26/2018 CLINICAL DATA:  Evaluate anatomy for gastrostomy tube placement. Pt scanned Laterally with Right Side Down. EXAM: CT ABDOMEN WITHOUT CONTRAST TECHNIQUE: Multidetector CT imaging of the abdomen was performed following the standard protocol without IV contrast. COMPARISON:  None available FINDINGS: Lower chest: No acute abnormality. Hepatobiliary: No focal liver abnormality is seen. No gallstones, gallbladder wall thickening, or biliary dilatation. Pancreas: Unremarkable. No pancreatic ductal dilatation or surrounding inflammatory changes. Spleen: Normal in size without focal abnormality. Adrenals/Urinary Tract: Adrenal glands are unremarkable. Kidneys are normal, without renal calculi, focal lesion, or hydronephrosis. Stomach/Bowel: Stomach is nondistended. There is a safe percutaneous window for gastrostomy placement. Visualized portions of small bowel and colon are nondilated, unremarkable. Vascular/Lymphatic: Tortuous atheromatous aorta  without aneurysm. No abdominal or mesenteric adenopathy. Other: No ascites. No free air. Musculoskeletal: Advanced degenerative disc disease in the visualized lower lumbar spine. Negative for fracture or worrisome bone lesion. IMPRESSION: 1.  Percutaneous window present  for gastrostomy placement. 2. No acute findings. Electronically Signed   By: Corlis Leak M.D.   On: 09/26/2018 14:40   Ct Head Wo Contrast  Result Date: 09/22/2018 CLINICAL DATA:  78 year old female with encephalopathy. EXAM: CT HEAD WITHOUT CONTRAST TECHNIQUE: Contiguous axial images were obtained from the base of the skull through the vertex without intravenous contrast. COMPARISON:  Brain MRI dated 09/01/2016 and CT dated 08/31/2016 FINDINGS: Brain: There is moderate age-related atrophy and chronic microvascular ischemic changes. There is no acute intracranial hemorrhage. No mass effect or midline shift. Old left occipital infarct. Vascular: No hyperdense vessel or unexpected calcification. Skull: Normal. Negative for fracture or focal lesion. Sinuses/Orbits: No acute finding. Other: None IMPRESSION: 1. No acute intracranial hemorrhage. 2. Moderate age-related atrophy and chronic microvascular ischemic changes. Old left occipital infarct. Electronically Signed   By: Elgie Collard M.D.   On: 09/22/2018 21:23   Dg Chest Port 1 View  Result Date: 09/22/2018 CLINICAL DATA:  Aspiration. EXAM: PORTABLE CHEST 1 VIEW COMPARISON:  Radiographs of August 31, 2016. FINDINGS: No pneumothorax or pleural effusion is noted. Stable cardiomediastinal silhouette. Both lungs are clear. The visualized skeletal structures are unremarkable. IMPRESSION: No acute cardiopulmonary abnormality seen. Electronically Signed   By: Lupita Raider, M.D.   On: 09/22/2018 13:17   Vas [place] Lower Extremity Venous (dvt) (only [facility])  Result Date: 09/24/2018  Lower Venous Study Indications: Swelling, and Edema.  Limitations: Extremely contracted body with stiffness,  combativeness. Comparison Study: lower extremity venous duplex exam on 03/21/2018 Performing Technologist: Melodie Bouillon  Examination Guidelines: A complete evaluation includes B-mode imaging, spectral Doppler, color Doppler, and power Doppler as needed of all accessible portions of each vessel. Bilateral testing is considered an integral part of a complete examination. Limited examinations for reoccurring indications may be performed as noted.  Right Venous Findings: +---+---------------+---------+-----------+----------+-------------------------+    CompressibilityPhasicitySpontaneityPropertiesSummary                   +---+---------------+---------+-----------+----------+-------------------------+  CFV                                             unable to obtain due to                                                   extremely contracted                                                      body, patient refused to                                                  coorperate                +---+---------------+---------+-----------+----------+-------------------------+  Left Venous Findings: +---------+---------------+---------+-----------+----------+-------------------+          CompressibilityPhasicitySpontaneityPropertiesSummary             +---------+---------------+---------+-----------+----------+-------------------+ CFV      None                    No                   Chronic             +---------+---------------+---------+-----------+----------+-------------------+ SFJ                                                   Not visualized      +---------+---------------+---------+-----------+----------+-------------------+ FV Prox                                               unable to obtain                                                          due to extremely                                                          contracted body,                                                           patient  refused to                                                        coorperate          +---------+---------------+---------+-----------+----------+-------------------+ FV Mid                                                unable to obtain                                                          due to extremely                                                          contracted body,                                                          patient refused to                                                        coorperate          +---------+---------------+---------+-----------+----------+-------------------+ FV Distal                                             unable to obtain                                                          due to extremely                                                          contracted body,                                                          patient refused to  coorperate          +---------+---------------+---------+-----------+----------+-------------------+ PFV                                                   Not visualized      +---------+---------------+---------+-----------+----------+-------------------+ POP      None           No       No                   Acute               +---------+---------------+---------+-----------+----------+-------------------+ PTV                                                   Not visualized      +---------+---------------+---------+-----------+----------+-------------------+ PERO                                                  Not visualized      +---------+---------------+---------+-----------+----------+-------------------+    Summary: Left: Findings consistent with acute deep vein thrombosis involving the  left popliteal vein. Findings consistent with chronic deep vein thrombosis involving the left femoral vein.  *See table(s) above for measurements and observations. Electronically signed by Coral Else MD on 09/24/2018 at 5:04:08 PM.    Final     Labs:  CBC: Recent Labs    09/22/18 1852 09/23/18 0508 09/25/18 1111 09/26/18 0502  WBC 10.2 10.2 9.6 8.6  HGB 12.4 12.4 9.9* 10.1*  HCT 38.8 37.8 30.8* 31.5*  PLT 160 136* 138* 177    COAGS: No results for input(s): INR, APTT in the last 8760 hours.  BMP: Recent Labs    09/24/18 1700 09/25/18 1111 09/26/18 0502 09/27/18 0422  NA 145 143 141 139  K 3.9 3.4* 3.4* 3.7  CL 115* 111 110 110  CO2 19* 21* 22 20*  GLUCOSE 138* 304* 127* 107*  BUN 15 12 10  7*  CALCIUM 8.6* 8.2* 8.7* 8.7*  CREATININE 0.76 0.87 0.71 0.70  GFRNONAA >60 >60 >60 >60  GFRAA >60 >60 >60 >60    LIVER FUNCTION TESTS: Recent Labs    03/16/18 1341 09/22/18 1241  BILITOT 0.5 1.2  AST 21 63*  ALT 9 26  ALKPHOS 95 71  PROT 7.8 7.5  ALBUMIN 3.9 2.9*    TUMOR MARKERS: No results for input(s): AFPTM, CEA, CA199, CHROMGRNA in the last 8760 hours.  Assessment and Plan:  Patient with history of advanced dementia and failure to thrive who presented to [facility] ED on 09/22/18 from PCPs office for same - found to have AKI, now resolved with IVF. Family is concerned about future nutrition and has requested a gastrostomy tube be placed. They have had several in depth conversations about g-tube placement by primary team, palliative team and myself - all of [place] have expressed that this is unlikely to change her quality of life and that it could in fact potentially worsen her quality of life (risk of infection, dislodgement of g-tube, tube malfunction requiring further procedures, etc). Her daughter Kendra Nixon  states understanding to all of the concerns presented to her by multiple providers regarding her mother's disease process and risks vs benefits of gastrostomy tube  placement. She and her family are adamant that they would like the gastrostomy tube to be placed to provide supplemental nutrition even though she is able to eat by mouth and appears to have returned to her baseline after treatment of the AKI.   Plan to proceed with gastrostomy tube placement 1/23 in IR -- NPO after midnight, lovenox held until 1800 on 1/23, AM labs ordered.   Risks and benefits discussed with the patient including, but not limited to the need for a barium enema during the procedure, bleeding, infection, peritonitis, or damage to adjacent structures.  All of the patient's questions were answered, patient is agreeable to proceed.  Consent signed and in chart.  Thank you for this interesting consult.  I greatly enjoyed meeting Kresta Templeman and look forward to participating in their care.  A copy of this report was sent to the requesting provider on this date.  Electronically Signed: Villa Herb, PA-C 09/27/2018, 11:26 AM   I spent a total of 40 Minutes  in face to face in clinical consultation, greater than 50% of which was counseling/coordinating care for gastrostomy tube placement.

## 2018-09-27 NOTE — Progress Notes (Signed)
PROGRESS NOTE        PATIENT DETAILS Name: Kendra Nixon Age: 78 y.o. Sex: female Date of Birth: 17-Dec-1940 Admit Date: 09/22/2018 Admitting Physician Ripudeep Jenna Luo, MD XLK:[id number], Audie Box, NP  Brief Narrative: Patient is a 78 y.o. female with prior CVA and right-sided weakness, dementia, hypertension, dyslipidemia-sent from PCPs office for worsening failure to thrive syndrome, found to have AKI, hypernatremia-admitted to the hospitalist service for supportive care.  See below for further details  Subjective: Restless-gets agitated when examined-no family at bedside.  Not able to answer questions appropriately-mostly mumbles incoherently.  Assessment/Plan: AKI with hypernatremia: Secondary to poor oral intake/dehydration.  Resolved with IVF.  Very difficult situation-appears to have advanced dementia with severe failure to thrive syndrome, extensive discussion with family by prior MD and palliative care team-family wanting PEG tube-awaiting IR input.  Acute metabolic encephalopathy on a background of dementia: Metabolic encephalopathy likely secondary to AKI/hyponatremia.  Very hard to assess what her baseline is-no family at bedside-but suspect she is not very far from baseline.  Acute left lower extremity DVT: Continue Lovenox-once PEG tube is placed-we will transition to oral anticoagulation.  We will plan on 3 months of treatment-not a ideal candidate for long-term anticoagulation.  Suspect DVT provoked given her bed to wheelchair bound status.  History of seizures: Continue Depakote-change back to oral route when able.  History of CVA with chronic right-sided weakness-bed to wheelchair bound: Continue aspirin and statin  Dementia with delirium: Suspect mental status close to usual baseline-however no family present.  Continue Seroquel.  Multiple decubitus ulcers stage II-stage III: At multiple sites including sacrum, right hip, right lateral malleolus  and left lower extremity-appreciate wound care input.  Failure to thrive syndrome/palliative care: Difficult situation-appears to have advanced dementia-now with failure to thrive syndrome-prognosis is very poor.  Extensive discussion by prior MD and palliative care team with family-family aware that placing PEG tube will not improve quality of life-however family insisting on PEG tube placement for nutrition-IR consult placed.  Evaluated by speech therapy-started dysphagia 1 diet-but oral intake remains very poor.Marland Kitchen  DVT Prophylaxis: Full dose anticoagulation with Lovenox  Code Status: Full code  Family Communication: None at bedside  Disposition Plan: Remain inpatient-may require SNF or home health services-we will discuss with social work/family.  Antimicrobial agents: Anti-infectives (From admission, onward)   Start     Dose/Rate Route Frequency Ordered Stop   09/22/18 1800  cefTRIAXone (ROCEPHIN) 1 g in sodium chloride 0.9 % 100 mL IVPB  Status:  Discontinued     1 g 200 mL/hr over 30 Minutes Intravenous Every 24 hours 09/22/18 1727 09/24/18 0902      Procedures: None  CONSULTS:  Palliative and IR  Time spent: 25- minutes-Greater than 50% of this time was spent in counseling, explanation of diagnosis, planning of further management, and coordination of care.  MEDICATIONS: Scheduled Meds: . aspirin  325 mg Oral Daily  . atorvastatin  80 mg Oral QODAY  . enoxaparin (LOVENOX) injection  55 mg Subcutaneous Q12H  . feeding supplement (ENSURE ENLIVE)  237 mL Oral BID BM  . insulin aspart  0-9 Units Subcutaneous Q4H  . multivitamin with minerals  1 tablet Oral Daily  . QUEtiapine  12.5 mg Oral Daily  . QUEtiapine  25 mg Oral QHS   Continuous Infusions: . dextrose 5 % and 0.45% NaCl 100 mL/hr  at 09/27/18 0533  . valproate sodium 500 mg (09/27/18 0928)   PRN Meds:.acetaminophen **OR** acetaminophen, ondansetron **OR** ondansetron (ZOFRAN) IV   PHYSICAL EXAM: Vital  signs: Vitals:   09/26/18 0641 09/26/18 1515 09/26/18 2122 09/27/18 0439  BP: 96/79 108/62 99/61 133/80  Pulse: 77 69 61 69  Resp:  18 16 (!) 24  Temp: 98 F (36.7 C) 97.6 F (36.4 C) 98 F (36.7 C) (!) 97.4 F (36.3 C)  TempSrc:  Oral Oral Oral  SpO2: 92% 96% 93% 97%  Weight:      Height:       Filed Weights   09/22/18 1731  Weight: 54.5 kg   Body mass index is 19.99 kg/m.   General appearance: Awake-confused HEENT: Atraumatic and Normocephalic Neck: supple Resp:Good air entry bilaterally, no added sounds  CVS: S1 S2 regular  GI: Bowel sounds present, Non tender and not distended with no gaurding, rigidity or rebound.No organomegaly Extremities: B/L Lower Ext shows no edema, both legs are warm to touch Neurology: Unable to evaluate-not following commands Musculoskeletal:No digital cyanosis Skin:No Rash, warm and dry  I have personally reviewed following labs and imaging studies  LABORATORY DATA: CBC: Recent Labs  Lab 09/22/18 1241 09/22/18 1852 09/23/18 0508 09/25/18 1111 09/26/18 0502  WBC 11.6* 10.2 10.2 9.6 8.6  NEUTROABS 8.8*  --   --   --   --   HGB 13.2 12.4 12.4 9.9* 10.1*  HCT 43.9 38.8 37.8 30.8* 31.5*  MCV 95.0 89.6 88.3 87.0 86.8  PLT 170 160 136* 138* 177    Basic Metabolic Panel: Recent Labs  Lab 09/23/18 0508 09/24/18 1700 09/25/18 1111 09/26/18 0502 09/27/18 0422  NA 149* 145 143 141 139  K 3.9 3.9 3.4* 3.4* 3.7  CL 112* 115* 111 110 110  CO2 23 19* 21* 22 20*  GLUCOSE 153* 138* 304* 127* 107*  BUN 24* 15 12 10  7*  CREATININE 1.00 0.76 0.87 0.71 0.70  CALCIUM 9.2 8.6* 8.2* 8.7* 8.7*    GFR: Estimated Creatinine Clearance: 49.1 mL/min (by C-G formula based on SCr of 0.7 mg/dL).  Liver Function Tests: Recent Labs  Lab 09/22/18 1241  AST 63*  ALT 26  ALKPHOS 71  BILITOT 1.2  PROT 7.5  ALBUMIN 2.9*   No results for input(s): LIPASE, AMYLASE in the last 168 hours. Recent Labs  Lab 09/22/18 1607  AMMONIA 38*     Coagulation Profile: No results for input(s): INR, PROTIME in the last 168 hours.  Cardiac Enzymes: Recent Labs  Lab 09/22/18 1852  CKTOTAL 194    BNP (last 3 results) Recent Labs    03/16/18 1341  PROBNP 90.0    HbA1C: Recent Labs    09/25/18 1307  HGBA1C 5.6    CBG: Recent Labs  Lab 09/26/18 1729 09/26/18 2119 09/26/18 2350 09/27/18 0345 09/27/18 0758  GLUCAP 101* 102* 101* 101* 96    Lipid Profile: No results for input(s): CHOL, HDL, LDLCALC, TRIG, CHOLHDL, LDLDIRECT in the last 72 hours.  Thyroid Function Tests: No results for input(s): TSH, T4TOTAL, FREET4, T3FREE, THYROIDAB in the last 72 hours.  Anemia Panel: No results for input(s): VITAMINB12, FOLATE, FERRITIN, TIBC, IRON, RETICCTPCT in the last 72 hours.  Urine analysis:    Component Value Date/Time   COLORURINE AMBER (A) 09/22/2018 1523   APPEARANCEUR HAZY (A) 09/22/2018 1523   LABSPEC 1.026 09/22/2018 1523   PHURINE 5.0 09/22/2018 1523   GLUCOSEU NEGATIVE 09/22/2018 1523   HGBUR NEGATIVE 09/22/2018 1523  BILIRUBINUR NEGATIVE 09/22/2018 1523   KETONESUR 20 (A) 09/22/2018 1523   PROTEINUR 30 (A) 09/22/2018 1523   UROBILINOGEN 1.0 01/30/2013 0322   NITRITE NEGATIVE 09/22/2018 1523   LEUKOCYTESUR NEGATIVE 09/22/2018 1523    Sepsis Labs: Lactic Acid, Venous    Component Value Date/Time   LATICACIDVEN 1.62 08/31/2016 1508    MICROBIOLOGY: Recent Results (from the past 240 hour(s))  Urine Culture     Status: None   Collection Time: 09/22/18  3:23 PM  Result Value Ref Range Status   Specimen Description URINE, CATHETERIZED  Final   Special Requests NONE  Final   Culture   Final    NO GROWTH Performed at [facility], [address] [place], [place] [address]    Report Status 09/23/2018 FINAL  Final    RADIOLOGY STUDIES/RESULTS: Ct Abdomen Wo Contrast  Result Date: 09/26/2018 CLINICAL DATA:  Evaluate anatomy for gastrostomy tube placement. Pt scanned Laterally with  Right Side Down. EXAM: CT ABDOMEN WITHOUT CONTRAST TECHNIQUE: Multidetector CT imaging of the abdomen was performed following the standard protocol without IV contrast. COMPARISON:  None available FINDINGS: Lower chest: No acute abnormality. Hepatobiliary: No focal liver abnormality is seen. No gallstones, gallbladder wall thickening, or biliary dilatation. Pancreas: Unremarkable. No pancreatic ductal dilatation or surrounding inflammatory changes. Spleen: Normal in size without focal abnormality. Adrenals/Urinary Tract: Adrenal glands are unremarkable. Kidneys are normal, without renal calculi, focal lesion, or hydronephrosis. Stomach/Bowel: Stomach is nondistended. There is a safe percutaneous window for gastrostomy placement. Visualized portions of small bowel and colon are nondilated, unremarkable. Vascular/Lymphatic: Tortuous atheromatous aorta without aneurysm. No abdominal or mesenteric adenopathy. Other: No ascites. No free air. Musculoskeletal: Advanced degenerative disc disease in the visualized lower lumbar spine. Negative for fracture or worrisome bone lesion. IMPRESSION: 1.  Percutaneous window present  for gastrostomy placement. 2. No acute findings. Electronically Signed   By: Corlis Leak M.D.   On: 09/26/2018 14:40   Ct Head Wo Contrast  Result Date: 09/22/2018 CLINICAL DATA:  78 year old female with encephalopathy. EXAM: CT HEAD WITHOUT CONTRAST TECHNIQUE: Contiguous axial images were obtained from the base of the skull through the vertex without intravenous contrast. COMPARISON:  Brain MRI dated 09/01/2016 and CT dated 08/31/2016 FINDINGS: Brain: There is moderate age-related atrophy and chronic microvascular ischemic changes. There is no acute intracranial hemorrhage. No mass effect or midline shift. Old left occipital infarct. Vascular: No hyperdense vessel or unexpected calcification. Skull: Normal. Negative for fracture or focal lesion. Sinuses/Orbits: No acute finding. Other: None  IMPRESSION: 1. No acute intracranial hemorrhage. 2. Moderate age-related atrophy and chronic microvascular ischemic changes. Old left occipital infarct. Electronically Signed   By: Elgie Collard M.D.   On: 09/22/2018 21:23   Dg Chest Port 1 View  Result Date: 09/22/2018 CLINICAL DATA:  Aspiration. EXAM: PORTABLE CHEST 1 VIEW COMPARISON:  Radiographs of August 31, 2016. FINDINGS: No pneumothorax or pleural effusion is noted. Stable cardiomediastinal silhouette. Both lungs are clear. The visualized skeletal structures are unremarkable. IMPRESSION: No acute cardiopulmonary abnormality seen. Electronically Signed   By: Lupita Raider, M.D.   On: 09/22/2018 13:17   Vas [place] Lower Extremity Venous (dvt) (only [facility])  Result Date: 09/24/2018  Lower Venous Study Indications: Swelling, and Edema.  Limitations: Extremely contracted body with stiffness, combativeness. Comparison Study: lower extremity venous duplex exam on 03/21/2018 Performing Technologist: Melodie Bouillon  Examination Guidelines: A complete evaluation includes B-mode imaging, spectral Doppler, color Doppler, and power Doppler as needed of all  accessible portions of each vessel. Bilateral testing is considered an integral part of a complete examination. Limited examinations for reoccurring indications may be performed as noted.  Right Venous Findings: +---+---------------+---------+-----------+----------+-------------------------+    CompressibilityPhasicitySpontaneityPropertiesSummary                   +---+---------------+---------+-----------+----------+-------------------------+ CFV                                             unable to obtain due to                                                   extremely contracted                                                      body, patient refused to                                                  coorperate                 +---+---------------+---------+-----------+----------+-------------------------+  Left Venous Findings: +---------+---------------+---------+-----------+----------+-------------------+          CompressibilityPhasicitySpontaneityPropertiesSummary             +---------+---------------+---------+-----------+----------+-------------------+ CFV      None                    No                   Chronic             +---------+---------------+---------+-----------+----------+-------------------+ SFJ                                                   Not visualized      +---------+---------------+---------+-----------+----------+-------------------+ FV Prox                                               unable to obtain                                                          due to extremely                                                          contracted body,  patient refused to                                                        coorperate          +---------+---------------+---------+-----------+----------+-------------------+ FV Mid                                                unable to obtain                                                          due to extremely                                                          contracted body,                                                          patient refused to                                                        coorperate          +---------+---------------+---------+-----------+----------+-------------------+ FV Distal                                             unable to obtain                                                          due to extremely                                                          contracted body,                                                           patient refused to  coorperate          +---------+---------------+---------+-----------+----------+-------------------+ PFV                                                   Not visualized      +---------+---------------+---------+-----------+----------+-------------------+ POP      None           No       No                   Acute               +---------+---------------+---------+-----------+----------+-------------------+ PTV                                                   Not visualized      +---------+---------------+---------+-----------+----------+-------------------+ PERO                                                  Not visualized      +---------+---------------+---------+-----------+----------+-------------------+    Summary: Left: Findings consistent with acute deep vein thrombosis involving the left popliteal vein. Findings consistent with chronic deep vein thrombosis involving the left femoral vein.  *See table(s) above for measurements and observations. Electronically signed by Coral ElseVance Brabham MD on 09/24/2018 at 5:04:08 PM.    Final      LOS: 5 days   Jeoffrey MassedShanker Gladie Gravette, MD  [facility]  If 7PM-7AM, please contact night-coverage  Please page via [email or web address]-Password [email or web address]-click on MD name and type text message  09/27/2018, 10:29 AM

## 2018-09-27 NOTE — Plan of Care (Signed)
Patient transferred to chair with 2 max assist, not voiding bladdering scanning at this time.  Patient refusing to eat and drink, encouraged to eat applesauce and drink coffee for breakfast.

## 2018-09-27 NOTE — Progress Notes (Signed)
In and out catheterization performed at bedside 5w12 for acute urinary retention at 0500. Procedure completed by Christean Grief, RN in presence of Marcy Siren, RN and [place], [place].  Pt confused and no evidence of learning noted when procedure explained to pt. Pt identified appropriately, peri care completed, gown and bed pads changed, sterile technique used during insertion of catheter.  Tolerated well by pt and 500 cc of amber, clear urine obtained. Will continue to monitor pt.

## 2018-09-27 NOTE — Progress Notes (Addendum)
Pt unable to urinate, bladder scan indicated 350 cc of urine in bladder. Per NP order, in and out catheterization completed at the bedside in 5 west 12 for acute urinary retention. Procedure explained to pt with no evidence of learning. Pt identified appropriately and privacy maintained.  Catheterization completed by Christean Grief, RN in presence of Marcy Siren, RN and Jearld Lesch, RN. Sterile technique maintained during procedure. About 380 of yellow urine with sediment obtained. Pt tolerated procedure well. Will continue to monitor pt.

## 2018-09-27 NOTE — Evaluation (Signed)
Physical Therapy Evaluation Patient Details Name: Kendra Nixon MRN: [id number] DOB: 11-May-1941 Today's Date: 09/27/2018   History of Present Illness  Pt is 78 yo female admitted for AKI with failure to thrive. Pt with advanced dementia and bed/w/c bound.  Clinical Impression  Pt with dementia and unable to actively participate in PT. Pt became agitated stating "leave me alone" when PT trying to work with pt. Per RN pt's daughter transfers pt to chair daily however unsure if pt has 24/7 assist daily. Pt unsafe to return home unless family can provide maxAx2 for mobility and can provide 24/7 otherwise pt will need SNF. Acute PT to follow.    Follow Up Recommendations Supervision/Assistance - 24 hour;Home health PT(vs SNF)    Equipment Recommendations  None recommended by PT    Recommendations for Other Services       Precautions / Restrictions Precautions Precautions: Fall Restrictions Weight Bearing Restrictions: No      Mobility  Bed Mobility Overal bed mobility: Needs Assistance Bed Mobility: Sidelying to Sit;Sit to Sidelying   Sidelying to sit: Total assist;+2 for physical assistance     Sit to sidelying: Total assist;+2 for physical assistance General bed mobility comments: pt with no initiation  Transfers Overall transfer level: Needs assistance Equipment used: None Transfers: Sit to/from Stand Sit to Stand: +2 physical assistance;Total assist         General transfer comment: pt not initiating or assisting, pt resisting, unable to clear bottom and achieve full upright position  Ambulation/Gait                Stairs            Wheelchair Mobility    Modified Rankin (Stroke Patients Only)       Balance Overall balance assessment: Needs assistance Sitting-balance support: Bilateral upper extremity supported;Feet supported Sitting balance-Leahy Scale: Fair Sitting balance - Comments: pt able to maintain static sitting balance x 5 min with  min guard     Standing balance-Leahy Scale: Zero                               Pertinent Vitals/Pain Pain Assessment: Faces Faces Pain Scale: Hurts little more Pain Location: grimacing with mobility    Home Living Family/patient expects to be discharged to:: Private residence Living Arrangements: Children Available Help at Discharge: Family;Available PRN/intermittently Type of Home: House Home Access: Ramped entrance     Home Layout: One level   Additional Comments: pt poor historian, per RN daughter is primary caregiver and gets pt in w/c daily. unsure if family is there 24/7, family not present     Prior Function Level of Independence: Needs assistance   Gait / Transfers Assistance Needed: maxAx2  ADL's / Homemaking Assistance Needed: dependent for all ADLs        Hand Dominance        Extremity/Trunk Assessment   Upper Extremity Assessment Upper Extremity Assessment: Generalized weakness(hold R UE in flexed contractured position)    Lower Extremity Assessment Lower Extremity Assessment: Generalized weakness;RLE deficits/detail;LLE deficits/detail;Difficult to assess due to impaired cognition RLE Deficits / Details: knee flexion contracture LLE Deficits / Details: knee flexion contracture    Cervical / Trunk Assessment Cervical / Trunk Assessment: Kyphotic  Communication   Communication: Expressive difficulties(advanced dementia)  Cognition Arousal/Alertness: Lethargic Behavior During Therapy: Agitated;Anxious Overall Cognitive Status: History of cognitive impairments - at baseline  General Comments: pt with advanced dementia, becomes agitated when moved      General Comments General comments (skin integrity, edema, etc.): pt with know sacral decubs, not seen, pt with bilat prafos    Exercises     Assessment/Plan    PT Assessment Patient needs continued PT services  PT Problem List  Decreased strength;Decreased range of motion;Decreased activity tolerance;Decreased balance;Decreased mobility;Decreased coordination;Decreased cognition;Decreased knowledge of use of DME;Decreased safety awareness       PT Treatment Interventions DME instruction;Gait training;Functional mobility training;Therapeutic exercise;Therapeutic activities;Balance training;Neuromuscular re-education;Cognitive remediation    PT Goals (Current goals can be found in the Care Plan section)  Acute Rehab PT Goals Patient Stated Goal: didn't state PT Goal Formulation: Patient unable to participate in goal setting Time For Goal Achievement: 10/11/18 Potential to Achieve Goals: Poor Additional Goals Additional Goal #1: Pt daughter to demonstrate safe std pvt transfer with patient to w/c for safe d/c home.    Frequency Min 2X/week   Barriers to discharge Decreased caregiver support unsure if 24/7 available    Co-evaluation               AM-PAC PT "6 Clicks" Mobility  Outcome Measure Help needed turning from your back to your side while in a flat bed without using bedrails?: Total Help needed moving from lying on your back to sitting on the side of a flat bed without using bedrails?: Total Help needed moving to and from a bed to a chair (including a wheelchair)?: Total Help needed standing up from a chair using your arms (e.g., wheelchair or bedside chair)?: Total Help needed to walk in hospital room?: Total Help needed climbing 3-5 steps with a railing? : Total 6 Click Score: 6    End of Session Equipment Utilized During Treatment: Gait belt Activity Tolerance: Treatment limited secondary to agitation Patient left: in bed;with bed alarm set Nurse Communication: Mobility status PT Visit Diagnosis: Adult, failure to thrive (R62.7);Muscle weakness (generalized) (M62.81)    Time: 1442-1500 PT Time Calculation (min) (ACUTE ONLY): 18 min   Charges:   PT Evaluation $PT Eval Moderate  Complexity: 1 Mod          Lewis Shock, PT, DPT Acute Rehabilitation Services Pager #: [phone] Office #: [phone]   Iona Hansen 09/27/2018, 4:34 PM

## 2018-09-27 NOTE — Progress Notes (Signed)
Bladder scan completed, 507 noted. No previous orders were placed for previous bladder scan of 382. MD notified again of the increase of urine in bladder. Pt has not urinated. MD made aware. RN waiting for new orders, and will continue to monitor.

## 2018-09-28 ENCOUNTER — Inpatient Hospital Stay (HOSPITAL_COMMUNITY): Payer: Medicare Other

## 2018-09-28 ENCOUNTER — Encounter (HOSPITAL_COMMUNITY): Payer: Self-pay | Admitting: Interventional Radiology

## 2018-09-28 ENCOUNTER — Telehealth: Payer: Self-pay | Admitting: Nurse Practitioner

## 2018-09-28 HISTORY — PX: IR GASTROSTOMY TUBE MOD SED: IMG625

## 2018-09-28 LAB — GLUCOSE, CAPILLARY
Glucose-Capillary: 103 mg/dL — ABNORMAL HIGH (ref 70–99)
Glucose-Capillary: 111 mg/dL — ABNORMAL HIGH (ref 70–99)
Glucose-Capillary: 112 mg/dL — ABNORMAL HIGH (ref 70–99)
Glucose-Capillary: 116 mg/dL — ABNORMAL HIGH (ref 70–99)
Glucose-Capillary: 91 mg/dL (ref 70–99)
Glucose-Capillary: 97 mg/dL (ref 70–99)
Glucose-Capillary: 99 mg/dL (ref 70–99)

## 2018-09-28 LAB — PROTIME-INR
INR: 1.09
Prothrombin Time: 14 seconds (ref 11.4–15.2)

## 2018-09-28 MED ORDER — GLUCAGON HCL RDNA (DIAGNOSTIC) 1 MG IJ SOLR
INTRAMUSCULAR | Status: AC
  Filled 2018-09-28: qty 1

## 2018-09-28 MED ORDER — CEFAZOLIN SODIUM-DEXTROSE 2-4 GM/100ML-% IV SOLN
2.0000 g | INTRAVENOUS | Status: AC
  Administered 2018-09-28: 2 g via INTRAVENOUS

## 2018-09-28 MED ORDER — IOPAMIDOL (ISOVUE-300) INJECTION 61%
INTRAVENOUS | Status: AC
  Administered 2018-09-28: 15 mL
  Filled 2018-09-28: qty 50

## 2018-09-28 MED ORDER — MIDAZOLAM HCL 2 MG/2ML IJ SOLN
INTRAMUSCULAR | Status: AC | PRN
  Administered 2018-09-28: 1 mg via INTRAVENOUS

## 2018-09-28 MED ORDER — FENTANYL CITRATE (PF) 100 MCG/2ML IJ SOLN
INTRAMUSCULAR | Status: AC
  Filled 2018-09-28: qty 2

## 2018-09-28 MED ORDER — CEFAZOLIN SODIUM-DEXTROSE 2-4 GM/100ML-% IV SOLN
INTRAVENOUS | Status: AC
  Filled 2018-09-28: qty 100

## 2018-09-28 MED ORDER — LIDOCAINE HCL 1 % IJ SOLN
INTRAMUSCULAR | Status: AC
  Filled 2018-09-28: qty 20

## 2018-09-28 MED ORDER — FENTANYL CITRATE (PF) 100 MCG/2ML IJ SOLN
INTRAMUSCULAR | Status: AC | PRN
  Administered 2018-09-28: 50 ug via INTRAVENOUS

## 2018-09-28 MED ORDER — GLUCAGON HCL RDNA (DIAGNOSTIC) 1 MG IJ SOLR
INTRAMUSCULAR | Status: AC | PRN
  Administered 2018-09-28: 1 mg via INTRAVENOUS

## 2018-09-28 MED ORDER — MIDAZOLAM HCL 2 MG/2ML IJ SOLN
INTRAMUSCULAR | Status: AC
  Filled 2018-09-28: qty 2

## 2018-09-28 NOTE — Telephone Encounter (Signed)
Copied from CRM [id number]. Topic: Quick Communication - See Telephone Encounter >> Sep 28, 2018  2:41 PM Arlyss Gandy, NT wrote: CRM for notification. See Telephone encounter for: 09/28/18. Amy with [facility] calling to see if Kendra Nixon will be attending of record for pt to start hospice once d/c'd from hospital. CB#: [phone]

## 2018-09-28 NOTE — Progress Notes (Signed)
[facility] Liaison: RN visit   Notified by Gae Gallop, [facility] of patient/family request for [facility] services at home after discharge. Chart and patient information under review by [facility] physician. Hospice eligibility pending at this time.   Writer spoke with daughter, [name] at bedside to initiate education related to hospice philosophy, services and team approach to care. Veronica verbalized understanding of information given. Per discussion, plan is for discharge to home by PTAR on 09/29/2018.   Please send signed and completed DNR form home with patient/family. Patient will need prescriptions for discharge comfort medications.   DME needs have been discussed, patient currently has the following equipment in the home: hospital bed and W/C.  Patient/family requests the following DME for delivery to the home: hoyer lift. [facility] equipment manager has been notified and will contact [facility] to arrange delivery to the home. Home address has been verified and is correct in the chart. [name] is the family member to contact to arrange time of delivery.   [facility] aware of the above. Please notify [facility] when patient is ready to leave the unit at discharge. (Call [phone] or [phone] after 5pm.) [facility] information and contact numbers given to [place] at time of visit. Above information shared with  Gae Gallop, CMRN.   Please call with any hospice related questions.   Thank you for this referral.   Elsie Saas, RN, [facility] Liaison [phone] ? Hospital liaisons are now on AMION.

## 2018-09-28 NOTE — Progress Notes (Signed)
ANTICOAGULATION CONSULT NOTE - Follow Up Consult  Pharmacy Consult for Lovenox Indication: DVT  No Known Allergies  Patient Measurements: Height: 5\' 5"  (165.1 cm) Weight: 120 lb 1.6 oz (54.5 kg) IBW/kg (Calculated) : 57  Vital Signs: Temp: 97.8 F (36.6 C) (01/23 0425) Temp Source: Oral (01/23 0425) BP: 116/72 (01/23 0425) Pulse Rate: 135 (01/23 0425)  Labs: Recent Labs    09/25/18 1111 09/26/18 0502 09/27/18 0422 09/28/18 0432  HGB 9.9* 10.1*  --   --   HCT 30.8* 31.5*  --   --   PLT 138* 177  --   --   LABPROT  --   --   --  14.0  INR  --   --   --  1.09  HEPARINUNFRC 0.18*  --   --   --   CREATININE 0.87 0.71 0.70  --     Estimated Creatinine Clearance: 49.1 mL/min (by C-G formula based on SCr of 0.7 mg/dL).  Assessment:  Anticoag: LMWH for LLE DVT. Hgb 10.1 (1/21). Plts 177 ok. Renal ok  Goal of Therapy:  Anti-Xa level 0.6-1 units/ml 4hrs after LMWH dose given Monitor platelets by anticoagulation protocol: Yes   Plan:  Plan Lovenox 55 mg SQ q12hrs.   CBC q 72hrs. Next 1/24 Plan 3 months OAC after tube placement (Warfarin or Rivaroxaban preferred) Gtube 1/23 IV>tube Depakote after tube placed  Dontae Minerva S. Merilynn Finland, PharmD, BCPS Clinical Staff Pharmacist Misty Stanley Stillinger 09/28/2018,8:17 AM

## 2018-09-28 NOTE — Care Management Note (Signed)
Case Management Note  Patient Details  Name: Kendra Nixon MRN: [id number] Date of Birth: 1941/02/20  Subjective/Objective:  Pt admitted with FTT. Hx of CVA and right-sided weakness, dementia, hypertension, dyslipidemia.                  Kendra Nixon (Daughter) Kendra Nixon (Son)    [phone]      Action/Plan:   NCM received consult for home hospice care. NCM spoke with daughter Kendra Nixon regarding d/c plan and daughter confirmed home with hospice care. Choice offered. [facility] selected per daughter. Referral made with [facility] for home hospice care. [facility] liaison, Chales Abrahams to f/u with pt's daughter.  NCM following for TOC needs .Marland Kitchen...  Expected Discharge Date:                  Expected Discharge Plan:  Home w Hospice Care  In-House Referral:  NA  Discharge planning Services  CM Consult  Post Acute Care Choice:  NA Choice offered to:  Adult Children(Kendra Nixon (daughter) ,[phone])  DME Arranged:  N/A DME Agency:  NA  HH Arranged:  NA HH Agency:  NA  Status of Service:  In process  If discussed at Long Length of Stay Meetings, dates discussed:    Additional Comments:  Epifanio Lesches, RN 09/28/2018, 1:54 PM

## 2018-09-28 NOTE — Progress Notes (Signed)
PROGRESS NOTE        PATIENT DETAILS Name: Kendra Nixon Age: 78 y.o. Sex: female Date of Birth: 1940-11-23 Admit Date: 09/22/2018 Admitting Physician Ripudeep Jenna LuoK Rai, MD WGN:[id number]:Shambley, Audie BoxAshleigh N, NP  Brief Narrative: Patient is a 78 y.o. female with prior CVA and right-sided weakness, dementia, hypertension, dyslipidemia-sent from PCPs office for worsening failure to thrive syndrome, found to have AKI, hypernatremia-admitted to the hospitalist service for supportive care.  See below for further details  Subjective: Lying comfortably in bed-not as agitated as yesterday when I examined her.  No family at bedside.  Assessment/Plan: AKI with hypernatremia: Secondary to poor oral intake/dehydration-resolved with IVF.  Repeat electrolytes tomorrow morning.  Very difficult situation-patient with very advanced dementia and severe failure to thrive syndrome-after extensive discussion with family by prior hospitalist MD and palliative care-family is elected to pursue PEG tube feeding to provide additional nutrition.    Acute metabolic encephalopathy on a background of dementia: Metabolic encephalopathy likely secondary to AKI and hyponatremia-very hard to assess but suspect she is not far from her usual baseline.  No family at bedside.    Acute left lower extremity DVT: Continue Lovenox-once PEG tube is placed-we will transition to an oral anticoagulation after discussion with family.   We will plan on 3 months of treatment-not a ideal candidate for long-term anticoagulation.  Suspect DVT provoked given her bed to wheelchair bound status.  History of seizures: Continue Depakote-we will change to tube feedings when possible.  History of CVA with chronic right-sided weakness-bed to wheelchair bound: Continue aspirin and statin  Dementia with delirium: Suspect mental status close to usual baseline-however no family present.  Continue Seroquel.  Multiple decubitus ulcers  stage II-stage III: At multiple sites including sacrum, right hip, right lateral malleolus and left lower extremity-appreciate wound care input.  Failure to thrive syndrome/palliative care: Difficult situation-appears to have advanced dementia-now with failure to thrive syndrome-prognosis is very poor.  Extensive discussion by prior hospitalist MD and palliative care team with family-family aware that placing a PEG tube will not improve patient's quality of life-in fact may place her at risk for infection, tube dislodgment etc.  However family is adamant on placing PEG tube and starting artificial feeding.  Remains on a dysphagia 1 diet but oral intake remains very poor.  DVT Prophylaxis: Full dose anticoagulation with Lovenox  Code Status: Full code  Family Communication: None at bedside  Disposition Plan: Remain inpatient-may require SNF or home health services-we will discuss with social work/family.  Antimicrobial agents: Anti-infectives (From admission, onward)   Start     Dose/Rate Route Frequency Ordered Stop   09/28/18 0115  ceFAZolin (ANCEF) IVPB 2g/100 mL premix     2 g 200 mL/hr over 30 Minutes Intravenous To Radiology 09/28/18 0042 09/29/18 0115   09/22/18 1800  cefTRIAXone (ROCEPHIN) 1 g in sodium chloride 0.9 % 100 mL IVPB  Status:  Discontinued     1 g 200 mL/hr over 30 Minutes Intravenous Every 24 hours 09/22/18 1727 09/24/18 0902      Procedures: None  CONSULTS:  Palliative and IR  Time spent: 25- minutes-Greater than 50% of this time was spent in counseling, explanation of diagnosis, planning of further management, and coordination of care.  MEDICATIONS: Scheduled Meds: . aspirin  325 mg Oral Daily  . atorvastatin  80 mg Oral QODAY  . enoxaparin (  LOVENOX) injection  55 mg Subcutaneous Q12H  . feeding supplement (ENSURE ENLIVE)  237 mL Oral BID BM  . insulin aspart  0-9 Units Subcutaneous Q4H  . multivitamin with minerals  1 tablet Oral Daily  .  QUEtiapine  12.5 mg Oral Daily  . QUEtiapine  25 mg Oral QHS   Continuous Infusions: .  ceFAZolin (ANCEF) IV    . dextrose 5 % and 0.45% NaCl 60 mL/hr at 09/28/18 0500  . valproate sodium 500 mg (09/28/18 0940)   PRN Meds:.acetaminophen **OR** acetaminophen, ondansetron **OR** ondansetron (ZOFRAN) IV   PHYSICAL EXAM: Vital signs: Vitals:   09/27/18 0439 09/27/18 2105 09/28/18 0425 09/28/18 1003  BP: 133/80 128/78 116/72 127/85  Pulse: 69 72 (!) 135 67  Resp: (!) 24 (!) 24 20   Temp: (!) 97.4 F (36.3 C) 97.6 F (36.4 C) 97.8 F (36.6 C) 98 F (36.7 C)  TempSrc: Oral Oral Oral   SpO2: 97% 96% 95% 95%  Weight:      Height:       Filed Weights   09/22/18 1731  Weight: 54.5 kg   Body mass index is 19.99 kg/m.   General appearance:Awake, confused-does not follow commands Eyes:no scleral icterus. HEENT: Atraumatic and Normocephalic Neck: supple Resp:Good air entry bilaterally,no rales or rhonchi CVS: S1 S2 regular, no murmurs.  GI: Bowel sounds present, Non tender and not distended with no gaurding, rigidity or rebound. Neurology: Hard to examine-given mental status Musculoskeletal:No digital cyanosis Skin:No Rash, warm and dry Wounds:N/A  I have personally reviewed following labs and imaging studies  LABORATORY DATA: CBC: Recent Labs  Lab 09/22/18 1241 09/22/18 1852 09/23/18 0508 09/25/18 1111 09/26/18 0502  WBC 11.6* 10.2 10.2 9.6 8.6  NEUTROABS 8.8*  --   --   --   --   HGB 13.2 12.4 12.4 9.9* 10.1*  HCT 43.9 38.8 37.8 30.8* 31.5*  MCV 95.0 89.6 88.3 87.0 86.8  PLT 170 160 136* 138* 177    Basic Metabolic Panel: Recent Labs  Lab 09/23/18 0508 09/24/18 1700 09/25/18 1111 09/26/18 0502 09/27/18 0422  NA 149* 145 143 141 139  K 3.9 3.9 3.4* 3.4* 3.7  CL 112* 115* 111 110 110  CO2 23 19* 21* 22 20*  GLUCOSE 153* 138* 304* 127* 107*  BUN 24* 15 12 10  7*  CREATININE 1.00 0.76 0.87 0.71 0.70  CALCIUM 9.2 8.6* 8.2* 8.7* 8.7*    GFR: Estimated  Creatinine Clearance: 49.1 mL/min (by C-G formula based on SCr of 0.7 mg/dL).  Liver Function Tests: Recent Labs  Lab 09/22/18 1241  AST 63*  ALT 26  ALKPHOS 71  BILITOT 1.2  PROT 7.5  ALBUMIN 2.9*   No results for input(s): LIPASE, AMYLASE in the last 168 hours. Recent Labs  Lab 09/22/18 1607  AMMONIA 38*    Coagulation Profile: Recent Labs  Lab 09/28/18 0432  INR 1.09    Cardiac Enzymes: Recent Labs  Lab 09/22/18 1852  CKTOTAL 194    BNP (last 3 results) Recent Labs    03/16/18 1341  PROBNP 90.0    HbA1C: Recent Labs    09/25/18 1307  HGBA1C 5.6    CBG: Recent Labs  Lab 09/27/18 1705 09/27/18 2101 09/28/18 0004 09/28/18 0422 09/28/18 0822  GLUCAP 95 117* 112* 111* 91    Lipid Profile: No results for input(s): CHOL, HDL, LDLCALC, TRIG, CHOLHDL, LDLDIRECT in the last 72 hours.  Thyroid Function Tests: No results for input(s): TSH, T4TOTAL, FREET4, T3FREE, THYROIDAB  in the last 72 hours.  Anemia Panel: No results for input(s): VITAMINB12, FOLATE, FERRITIN, TIBC, IRON, RETICCTPCT in the last 72 hours.  Urine analysis:    Component Value Date/Time   COLORURINE AMBER (A) 09/22/2018 1523   APPEARANCEUR HAZY (A) 09/22/2018 1523   LABSPEC 1.026 09/22/2018 1523   PHURINE 5.0 09/22/2018 1523   GLUCOSEU NEGATIVE 09/22/2018 1523   HGBUR NEGATIVE 09/22/2018 1523   BILIRUBINUR NEGATIVE 09/22/2018 1523   KETONESUR 20 (A) 09/22/2018 1523   PROTEINUR 30 (A) 09/22/2018 1523   UROBILINOGEN 1.0 01/30/2013 0322   NITRITE NEGATIVE 09/22/2018 1523   LEUKOCYTESUR NEGATIVE 09/22/2018 1523    Sepsis Labs: Lactic Acid, Venous    Component Value Date/Time   LATICACIDVEN 1.62 08/31/2016 1508    MICROBIOLOGY: Recent Results (from the past 240 hour(s))  Urine Culture     Status: None   Collection Time: 09/22/18  3:23 PM  Result Value Ref Range Status   Specimen Description URINE, CATHETERIZED  Final   Special Requests NONE  Final   Culture   Final      NO GROWTH Performed at [facility], [address] [place], [place] [address]    Report Status 09/23/2018 FINAL  Final    RADIOLOGY STUDIES/RESULTS: Ct Abdomen Wo Contrast  Result Date: 09/26/2018 CLINICAL DATA:  Evaluate anatomy for gastrostomy tube placement. Pt scanned Laterally with Right Side Down. EXAM: CT ABDOMEN WITHOUT CONTRAST TECHNIQUE: Multidetector CT imaging of the abdomen was performed following the standard protocol without IV contrast. COMPARISON:  None available FINDINGS: Lower chest: No acute abnormality. Hepatobiliary: No focal liver abnormality is seen. No gallstones, gallbladder wall thickening, or biliary dilatation. Pancreas: Unremarkable. No pancreatic ductal dilatation or surrounding inflammatory changes. Spleen: Normal in size without focal abnormality. Adrenals/Urinary Tract: Adrenal glands are unremarkable. Kidneys are normal, without renal calculi, focal lesion, or hydronephrosis. Stomach/Bowel: Stomach is nondistended. There is a safe percutaneous window for gastrostomy placement. Visualized portions of small bowel and colon are nondilated, unremarkable. Vascular/Lymphatic: Tortuous atheromatous aorta without aneurysm. No abdominal or mesenteric adenopathy. Other: No ascites. No free air. Musculoskeletal: Advanced degenerative disc disease in the visualized lower lumbar spine. Negative for fracture or worrisome bone lesion. IMPRESSION: 1.  Percutaneous window present  for gastrostomy placement. 2. No acute findings. Electronically Signed   By: Corlis Leak M.D.   On: 09/26/2018 14:40   Ct Head Wo Contrast  Result Date: 09/22/2018 CLINICAL DATA:  78 year old female with encephalopathy. EXAM: CT HEAD WITHOUT CONTRAST TECHNIQUE: Contiguous axial images were obtained from the base of the skull through the vertex without intravenous contrast. COMPARISON:  Brain MRI dated 09/01/2016 and CT dated 08/31/2016 FINDINGS: Brain: There is moderate age-related atrophy and  chronic microvascular ischemic changes. There is no acute intracranial hemorrhage. No mass effect or midline shift. Old left occipital infarct. Vascular: No hyperdense vessel or unexpected calcification. Skull: Normal. Negative for fracture or focal lesion. Sinuses/Orbits: No acute finding. Other: None IMPRESSION: 1. No acute intracranial hemorrhage. 2. Moderate age-related atrophy and chronic microvascular ischemic changes. Old left occipital infarct. Electronically Signed   By: Elgie Collard M.D.   On: 09/22/2018 21:23   Dg Chest Port 1 View  Result Date: 09/22/2018 CLINICAL DATA:  Aspiration. EXAM: PORTABLE CHEST 1 VIEW COMPARISON:  Radiographs of August 31, 2016. FINDINGS: No pneumothorax or pleural effusion is noted. Stable cardiomediastinal silhouette. Both lungs are clear. The visualized skeletal structures are unremarkable. IMPRESSION: No acute cardiopulmonary abnormality seen. Electronically Signed   By: Fayrene Fearing  Christen ButterGreen Jr, M.D.   On: 09/22/2018 13:17   Vas Koreas Lower Extremity Venous (dvt) (only Mc & Wl)  Result Date: 09/24/2018  Lower Venous Study Indications: Swelling, and Edema.  Limitations: Extremely contracted body with stiffness, combativeness. Comparison Study: lower extremity venous duplex exam on 03/21/2018 Performing Technologist: Melodie BouillonSelina Cole  Examination Guidelines: A complete evaluation includes B-mode imaging, spectral Doppler, color Doppler, and power Doppler as needed of all accessible portions of each vessel. Bilateral testing is considered an integral part of a complete examination. Limited examinations for reoccurring indications may be performed as noted.  Right Venous Findings: +---+---------------+---------+-----------+----------+-------------------------+    CompressibilityPhasicitySpontaneityPropertiesSummary                   +---+---------------+---------+-----------+----------+-------------------------+ CFV                                             unable  to obtain due to                                                   extremely contracted                                                      body, patient refused to                                                  coorperate                +---+---------------+---------+-----------+----------+-------------------------+  Left Venous Findings: +---------+---------------+---------+-----------+----------+-------------------+          CompressibilityPhasicitySpontaneityPropertiesSummary             +---------+---------------+---------+-----------+----------+-------------------+ CFV      None                    No                   Chronic             +---------+---------------+---------+-----------+----------+-------------------+ SFJ                                                   Not visualized      +---------+---------------+---------+-----------+----------+-------------------+ FV Prox                                               unable to obtain  due to extremely                                                          contracted body,                                                          patient refused to                                                        coorperate          +---------+---------------+---------+-----------+----------+-------------------+ FV Mid                                                unable to obtain                                                          due to extremely                                                          contracted body,                                                          patient refused to                                                        coorperate          +---------+---------------+---------+-----------+----------+-------------------+ FV Distal                                              unable to obtain                                                          due to extremely  contracted body,                                                          patient refused to                                                        coorperate          +---------+---------------+---------+-----------+----------+-------------------+ PFV                                                   Not visualized      +---------+---------------+---------+-----------+----------+-------------------+ POP      None           No       No                   Acute               +---------+---------------+---------+-----------+----------+-------------------+ PTV                                                   Not visualized      +---------+---------------+---------+-----------+----------+-------------------+ PERO                                                  Not visualized      +---------+---------------+---------+-----------+----------+-------------------+    Summary: Left: Findings consistent with acute deep vein thrombosis involving the left popliteal vein. Findings consistent with chronic deep vein thrombosis involving the left femoral vein.  *See table(s) above for measurements and observations. Electronically signed by Coral Else MD on 09/24/2018 at 5:04:08 PM.    Final      LOS: 6 days   Jeoffrey Massed, MD  [facility]  If 7PM-7AM, please contact night-coverage  Please page via [email or web address]-Password [email or web address]-click on MD name and type text message  09/28/2018, 10:13 AM

## 2018-09-28 NOTE — Telephone Encounter (Signed)
Called number below to advise Kendra Nixon will be attending of record for patient once d/c'd to Hospice. However, number provided below is a fax number.

## 2018-09-28 NOTE — Progress Notes (Signed)
OT Cancellation Note  Patient Details Name: Kendra Nixon MRN: [id number] DOB: 07-07-1941   Cancelled Treatment:    Reason Eval/Treat Not Completed: Patient at procedure or test/ unavailable. Pt to OR for  G-tube placement. Will check back on pt next appropriate/available time   Galen Manila 09/28/2018, 10:58 AM

## 2018-09-28 NOTE — Procedures (Signed)
Interventional Radiology Procedure Note  Procedure: Placement of percutaneous 20F pull-through gastrostomy tube. Complications: None Recommendations: - NPO except for sips and chips remainder of today and overnight - Maintain G-tube to LWS until tomorrow morning  - May advance diet as tolerated and begin using tube tomorrow morning  Signed,  Shawna Wearing K. Rabia Argote, MD   

## 2018-09-28 NOTE — Progress Notes (Signed)
Nutrition Follow-up  DOCUMENTATION CODES:   Not applicable, will assess for malnutrition at follow-up after completion of NFPE  INTERVENTION:   Once G-tube cleared for use, recommend initiating continuous TF regimen: - Start Osmolite 1.2 @ 20 ml/hr and increase by 10 ml q 8 hours until goal rate of 50 ml/hr is reached (1200 ml/day) - Free water per MD  Recommended tube feeding regimen provides 1440 kcal, 67 grams of protein, and 984 ml of H2O (100% of needs).  Monitor magnesium, potassium, and phosphorus daily for at least 3 days. MD to replete as needed, as pt is at risk for refeeding syndrome given inadequate PO intake x 6 days (since admission) in addition to at least 2 days PTA.  - d/c Ensure Enlive  - Continue liquid MVI daily via tube  NUTRITION DIAGNOSIS:   Inadequate oral intake related to lethargy/confusion, dysphagia, chronic illness (dementia) as evidenced by per patient/family report, meal completion < 25%.  Ongoing  GOAL:   Patient will meet greater than or equal to 90% of their needs  Unmet at this time  MONITOR:   PO intake, Diet advancement, Weight trends, Skin, Labs, Other (GOC)  REASON FOR ASSESSMENT:   Consult Assessment of nutrition requirement/status  ASSESSMENT:   78 year old female who presented to the ED on 1/17 with FTT and hypotension. PMH significant for CVA and right-sided weakness, dementia, HTN, HLD. Pt admitted with AKI.  Noted extensive discussions were had with pt's family regarding GOC and that pt's family has decided to proceed with G-tube placement despite risks (aspiration, infection, etc.) and that it is unlikely to improve quality or quantity of life. Noted plan for G-tube placement today by IR. RD to leave TF recommendations.  Discussed pt with RN. Pt leaving room with transport at time of RD visit for G-tube placement. Suspect some degree of malnutrition but unable to confirm at this time without completion of NFPE. Will  re-attempt at follow-up.  No family present at time of RD visit.  Prior to NPO for G-tube placement, pt was on a dysphagia 1 diet with thin liquids.  Meal Completion: 0-10% x last 4 recorded meals  Medications reviewed and include: Ensure Enlive BID, SSI q 4 hours, MVI with minerals daily, IV antibiotics IVF: D5 in 0.45% saline @ 60 ml/hr (provides 245 kcal daily)  Labs reviewed: BUN 7 (H) CBG's: 91, 111, 112, 117, 95 x 24 hours  NUTRITION - FOCUSED PHYSICAL EXAM:  Unable to complete. Pt being transported from room to IR at time of RD visit. Pt will need NFPE at follow-up.  Diet Order:   Diet Order            Diet NPO time specified Except for: Sips with Meds  Diet effective midnight              EDUCATION NEEDS:   No education needs have been identified at this time  Skin:  Skin Assessment: Skin Integrity Issues: DTI: right ankle Stage II: right hip, sacrum Stage III: ischial tuberostiy Other: non-pressure wound to left leg  Last BM:  1/22 (smear type 5)  Height:   Ht Readings from Last 1 Encounters:  09/22/18 5\' 5"  (1.651 m)    Weight:   Wt Readings from Last 1 Encounters:  09/22/18 54.5 kg    Ideal Body Weight:  56.8 kg  BMI:  Body mass index is 19.99 kg/m.  Estimated Nutritional Needs:   Kcal:  1250-1450  Protein:  65-75 grams  Fluid:  1.3-1.5  L    Gaynell Face, MS, RD, LDN Inpatient Clinical Dietitian Pager: [phone] Weekend/After Hours: [phone]

## 2018-09-29 LAB — GLUCOSE, CAPILLARY
Glucose-Capillary: 100 mg/dL — ABNORMAL HIGH (ref 70–99)
Glucose-Capillary: 95 mg/dL (ref 70–99)
Glucose-Capillary: 125 mg/dL — ABNORMAL HIGH (ref 70–99)
Glucose-Capillary: 81 mg/dL (ref 70–99)

## 2018-09-29 LAB — CBC
HCT: 29.5 % — ABNORMAL LOW (ref 36.0–46.0)
HEMOGLOBIN: 10.1 g/dL — AB (ref 12.0–15.0)
MCH: 29 pg (ref 26.0–34.0)
MCHC: 34.2 g/dL (ref 30.0–36.0)
MCV: 84.8 fL (ref 80.0–100.0)
PLATELETS: 234 10*3/uL (ref 150–400)
RBC: 3.48 MIL/uL — ABNORMAL LOW (ref 3.87–5.11)
RDW: 15.6 % — ABNORMAL HIGH (ref 11.5–15.5)
WBC: 8.1 10*3/uL (ref 4.0–10.5)
nRBC: 1 % — ABNORMAL HIGH (ref 0.0–0.2)

## 2018-09-29 LAB — BASIC METABOLIC PANEL
ANION GAP: 10 (ref 5–15)
BUN: 5 mg/dL — ABNORMAL LOW (ref 8–23)
CO2: 19 mmol/L — ABNORMAL LOW (ref 22–32)
Calcium: 8.5 mg/dL — ABNORMAL LOW (ref 8.9–10.3)
Chloride: 107 mmol/L (ref 98–111)
Creatinine, Ser: 0.83 mg/dL (ref 0.44–1.00)
GFR calc Af Amer: >60 mL/min (ref >60)
GFR calc non Af Amer: >60 mL/min (ref >60)
Glucose, Bld: 104 mg/dL — ABNORMAL HIGH (ref 70–99)
Potassium: 3.6 mmol/L (ref 3.5–5.1)
Sodium: 136 mmol/L (ref 135–145)

## 2018-09-29 MED ORDER — QUETIAPINE FUMARATE 25 MG PO TABS
12.5000 mg | ORAL_TABLET | Freq: Every day | ORAL | Status: DC
  Administered 2018-09-30: 12.5 mg
  Filled 2018-09-29: qty 1

## 2018-09-29 MED ORDER — ATORVASTATIN CALCIUM 80 MG PO TABS
80.0000 mg | ORAL_TABLET | ORAL | Status: DC
  Administered 2018-09-30: 80 mg
  Filled 2018-09-29: qty 1

## 2018-09-29 MED ORDER — ADULT MULTIVITAMIN LIQUID CH
15.0000 mL | Freq: Every day | ORAL | Status: DC
  Administered 2018-09-29 – 2018-09-30 (×2): 15 mL
  Filled 2018-09-29 (×2): qty 15

## 2018-09-29 MED ORDER — RIVAROXABAN 15 MG PO TABS
15.0000 mg | ORAL_TABLET | Freq: Two times a day (BID) | ORAL | Status: DC
  Administered 2018-09-29 – 2018-09-30 (×2): 15 mg
  Filled 2018-09-29 (×2): qty 1

## 2018-09-29 MED ORDER — DIVALPROEX SODIUM 250 MG PO DR TAB: 500.0000 mg | DELAYED_RELEASE_TABLET | Freq: Every day | ORAL | Status: DC

## 2018-09-29 MED ORDER — QUETIAPINE FUMARATE 25 MG PO TABS
25.0000 mg | ORAL_TABLET | Freq: Every day | ORAL | Status: DC
  Administered 2018-09-29: 25 mg
  Filled 2018-09-29: qty 1

## 2018-09-29 MED ORDER — ASPIRIN 325 MG PO TABS
325.0000 mg | ORAL_TABLET | Freq: Every day | ORAL | Status: DC
  Administered 2018-09-30: 325 mg
  Filled 2018-09-29: qty 1

## 2018-09-29 MED ORDER — VALPROIC ACID 250 MG/5ML PO SOLN
250.0000 mg | Freq: Two times a day (BID) | ORAL | Status: DC
  Administered 2018-09-30: 250 mg
  Filled 2018-09-29: qty 5

## 2018-09-29 MED ORDER — OSMOLITE 1.5 CAL PO LIQD
237.0000 mL | Freq: Three times a day (TID) | ORAL | Status: DC
  Administered 2018-09-29: 200 mL
  Administered 2018-09-29: 150 mL
  Administered 2018-09-29: 100 mL
  Administered 2018-09-30 (×2): 237 mL
  Filled 2018-09-29 (×7): qty 237

## 2018-09-29 NOTE — Progress Notes (Signed)
MD notfied Pt without iv access and when Rn attempted to place IV pt became combative and would not allow Rn to place, MD aware and advised nurse to monitor urine output and BP if either decrease Rn will attempt to place new IV Night shift Rn jenny made aware.

## 2018-09-29 NOTE — Progress Notes (Signed)
OT Cancellation Note  Patient Details Name: Kendra Nixon Hershman MRN: [id number] DOB: [birth year]   Cancelled Treatment:    Reason Eval/Treat Not Completed: OT screened, no needs identified, will sign off;Other (comment). Pt's family now taking pt home on hospice when appropriate for acute stay d/c  Galen ManilaSpencer, Amber Guthridge Jeanette 09/29/2018, 9:56 AM

## 2018-09-29 NOTE — Progress Notes (Signed)
Palliative: [name] is resting quietly in bed in the fetal position, her position of comfort.  She will open her eyes briefly, but not make or keep eye contact.  She appears to be quite frail, chronically ill.  She has known advanced dementia and is unable to make her basic needs known.  Present today at bedside is daughter Kendra Nixon.  Veronica and I talk about PEG tube placement, tube ready for use.  We talked about bolus feedings instead of continuous feeds, and free water.  I reassure Kendra Nixon that she will have help at home from hospice providers.  Kendra Nixon shares that her mother was able to take some coffee from a spoon yesterday, also some applesauce.  She tells me that she knows this is not enough to keep her mother alive, but that Kendra Nixon can enjoy the pleasure of food.   Kendra Nixon tells me that she would prefer her mother have by mouth blood thinner instead of injections if possible.  Share that both have positives, injectable blood thinner does not need as much testing.  I encourage Kendra Nixon and her family to lean on hospice for any further questions, and direction of care.  Kendra Nixon tells me that Kendra Nixon's youngest son, Kendra Nixon, will arrive later today.  Kendra Nixon shares that she feels it is important for Kendra Nixon to see Kendra Nixon's condition.  Conference with hospitalist related to tube feeding, home with hospice. Conference with [facility] liaison related to tube feeding, services are available on Saturday if discharged. Conference with case management related to patient needs, tube feeding.  40 minutes Kendra Carmel, NP  Palliative Medicine Team  Team Phone # [phone]  Greater than 50% of this time was spent counseling and coordinating care related to the above assessment and plan.

## 2018-09-29 NOTE — Progress Notes (Signed)
Referring Physician(s): Dr Leone Haven  Supervising Physician: Richarda Overlie  Patient Status:  [facility] - In-pt  Chief Complaint:  Dysphagia  Subjective:  Perc G tube placed in IR yesterday Seems to be in NAD  Allergies: Patient has no known allergies.  Medications: Prior to Admission medications   Medication Sig Start Date End Date Taking? Authorizing Provider  amLODipine (NORVASC) 10 MG tablet TAKE 1 TABLET BY MOUTH ONCE DAILY 07/10/18  Yes Evaristo Bury, NP  aspirin 325 MG tablet Take 1 tablet (325 mg total) by mouth daily. 09/03/16  Yes Dhungel, Nishant, MD  atorvastatin (LIPITOR) 80 MG tablet Take 1 tablet (80 mg total) by mouth every other day. 08/07/18  Yes Evaristo Bury, NP  divalproex (DEPAKOTE ER) 250 MG 24 hr tablet Take 2 tablets every evening 07/25/18  Yes Van Clines, MD  Menthol-Methyl Salicylate (MUSCLE RUB) 10-15 % CREA Apply 1 application topically as needed for muscle pain (shoulder pain).   Yes [provider]  Multiple Vitamin (MULTIVITAMIN) tablet Take 1 tablet by mouth daily.   Yes [provider]  QUEtiapine (SEROQUEL) 25 MG tablet Take 1/2 tablet in AM, then 1.5 tablets every night 03/17/18  Yes Van Clines, MD     Vital Signs: BP 118/60 (BP Location: Left Arm)   Pulse 60   Temp 98.4 F (36.9 C) (Oral)   Resp (!) 28   Ht 5\' 5"  (1.651 m)   Wt 120 lb 1.6 oz (54.5 kg)   SpO2 92%   BMI 19.99 kg/m   Physical Exam Vitals signs reviewed.  Skin:    Comments: Site is clean and dry Small amt old blood on bandage No hematoma +BS      Imaging: Ct Abdomen Wo Contrast  Result Date: 09/26/2018 CLINICAL DATA:  Evaluate anatomy for gastrostomy tube placement. Pt scanned Laterally with Right Side Down. EXAM: CT ABDOMEN WITHOUT CONTRAST TECHNIQUE: Multidetector CT imaging of the abdomen was performed following the standard protocol without IV contrast. COMPARISON:  None available FINDINGS: Lower chest: No acute abnormality.  Hepatobiliary: No focal liver abnormality is seen. No gallstones, gallbladder wall thickening, or biliary dilatation. Pancreas: Unremarkable. No pancreatic ductal dilatation or surrounding inflammatory changes. Spleen: Normal in size without focal abnormality. Adrenals/Urinary Tract: Adrenal glands are unremarkable. Kidneys are normal, without renal calculi, focal lesion, or hydronephrosis. Stomach/Bowel: Stomach is nondistended. There is a safe percutaneous window for gastrostomy placement. Visualized portions of small bowel and colon are nondilated, unremarkable. Vascular/Lymphatic: Tortuous atheromatous aorta without aneurysm. No abdominal or mesenteric adenopathy. Other: No ascites. No free air. Musculoskeletal: Advanced degenerative disc disease in the visualized lower lumbar spine. Negative for fracture or worrisome bone lesion. IMPRESSION: 1.  Percutaneous window present  for gastrostomy placement. 2. No acute findings. Electronically Signed   By: Corlis Leak M.D.   On: 09/26/2018 14:40   Ir Gastrostomy Tube Mod Sed  Result Date: 09/28/2018 INDICATION: 78 year old female with advanced dementia, poor oral intake, protein calorie malnutrition and failure to thrive. EXAM: Fluoroscopically guided placement of percutaneous pull-through gastrostomy tube Interventional Radiologist:  Sterling Big, MD MEDICATIONS: 2 g Ancef and 1 g glucagon; Antibiotics were administered within 1 hour of the procedure. ANESTHESIA/SEDATION: Versed 1 mg IV; Fentanyl 50 mcg IV Moderate Sedation Time:  12 minutes The patient was continuously monitored during the procedure by the interventional radiology nurse under my direct supervision. CONTRAST:  15 mL Isovue-300 FLUOROSCOPY TIME:  Fluoroscopy Time: 3 minutes 48 seconds (9 mGy). COMPLICATIONS: None  immediate. PROCEDURE: Informed written consent was obtained from the patient after a thorough discussion of the procedural risks, benefits and alternatives. All questions were  addressed. Maximal Sterile Barrier Technique was utilized including caps, mask, sterile gowns, sterile gloves, sterile drape, hand hygiene and skin antiseptic. A timeout was performed prior to the initiation of the procedure. Maximal barrier sterile technique utilized including caps, mask, sterile gowns, sterile gloves, large sterile drape, hand hygiene, and chlorhexadine skin prep. An angled catheter was advanced over a wire under fluoroscopic guidance through the nose, down the esophagus and into the body of the stomach. The stomach was then insufflated with several 100 ml of air. Fluoroscopy confirmed location of the gastric bubble, as well as inferior displacement of the barium stained colon. Under direct fluoroscopic guidance, a single T-tack was placed, and the anterior gastric wall drawn up against the anterior abdominal wall. Percutaneous access was then obtained into the mid gastric body with an 18 gauge sheath needle. Aspiration of air, and injection of contrast material under fluoroscopy confirmed needle placement. An Amplatz wire was advanced in the gastric body and the access needle exchanged for a 9-French vascular sheath. A snare device was advanced through the vascular sheath and an Amplatz wire advanced through the angled catheter. The Amplatz wire was successfully snared and this was pulled up through the esophagus and out the mouth. A 20-French Burnell BlanksKimberly Clark MIC-PEG tube was then connected to the snare and pulled through the mouth, down the esophagus, into the stomach and out to the anterior abdominal wall. Hand injection of contrast material confirmed intragastric location. The T-tack retention suture was then cut. The pull through peg tube was then secured with the external bumper and capped. The patient will be observed for several hours with the newly placed tube on low wall suction to evaluate for any post procedure complication. The patient tolerated the procedure well, there is no  immediate complication. IMPRESSION: Successful placement of a 20 French pull through gastrostomy tube. Electronically Signed   By: Malachy MoanHeath  McCullough M.D.   On: 09/28/2018 12:42    Labs:  CBC: Recent Labs    09/23/18 0508 09/25/18 1111 09/26/18 0502 09/29/18 0604  WBC 10.2 9.6 8.6 8.1  HGB 12.4 9.9* 10.1* 10.1*  HCT 37.8 30.8* 31.5* 29.5*  PLT 136* 138* 177 234    COAGS: Recent Labs    09/28/18 0432  INR 1.09    BMP: Recent Labs    09/25/18 1111 09/26/18 0502 09/27/18 0422 09/29/18 0604  NA 143 141 139 136  K 3.4* 3.4* 3.7 3.6  CL 111 110 110 107  CO2 21* 22 20* 19*  GLUCOSE 304* 127* 107* 104*  BUN 12 10 7* 5*  CALCIUM 8.2* 8.7* 8.7* 8.5*  CREATININE 0.87 0.71 0.70 0.83  GFRNONAA >60 >60 >60 >60  GFRAA >60 >60 >60 >60    LIVER FUNCTION TESTS: Recent Labs    03/16/18 1341 09/22/18 1241  BILITOT 0.5 1.2  AST 21 63*  ALT 9 26  ALKPHOS 95 71  PROT 7.8 7.5  ALBUMIN 3.9 2.9*    Assessment and Plan:  Percutaneous gastric tube May use now  Electronically Signed: Robet LeuPamela A Hadlea Furuya, PA-C 09/29/2018, 7:36 AM   I spent a total of 15 Minutes at the the patient's bedside AND on the patient's hospital floor or unit, greater than 50% of which was counseling/coordinating care for perc G tube

## 2018-09-29 NOTE — Telephone Encounter (Signed)
Notified Amy

## 2018-09-29 NOTE — Care Management Note (Signed)
Case Management Note  Patient Details  Name: [name] MRN: [id number] Date of Birth: November 27, 1940  Subjective/Objective:              Pt admitted with FTT. Hx of CVA and right-sided weakness, dementia, hypertension, dyslipidemia. Resides with daughter [name].  [name] (Daughter) [name] (Son)    [phone]           PCP: Alphonse Guild  Action/Plan: Transition to home with hospice care on 09/30/2018 once pt has achieved goal bolus tube feeding. [facility] will need to be called and arranged for pt pick up transportation to home.  Gold "DNR" will need to be completed per MD and placed with d/c packet.  Expected Discharge Date:    09/30/2018        Expected Discharge Plan:  Home w Hospice Care  In-House Referral:  NA  Discharge planning Services  CM Consult  Post Acute Care Choice:  NA Choice offered to:  Adult Children(Veronica (daughter) ,[phone])  DME Arranged:  N/A, hospice ([facility]) to supply pt with equipment needed for home. DME Agency:  NA  HH Arranged:  NA HH Agency:  NA  Status of Service:  Completed, signed off  If discussed at Long Length of Stay Meetings, dates discussed:    Additional Comments:  Epifanio Lesches, RN 09/29/2018, 1:50 PM

## 2018-09-29 NOTE — Progress Notes (Signed)
Nutrition Follow-up  DOCUMENTATION CODES:   Not applicable  INTERVENTION:   Monitor magnesium, potassium, and phosphorus daily for at least 3 days, MD to replete as needed, as pt is at risk for refeeding syndrome.  Initiate bolus tube feeding regimen via G-tube: - Provide Osmolite 1.5 cal formula 100 ml for first bolus feeding and increase by 50 ml each subsequent feeding until goal volume of 237 ml (1 can) is reached - Provide free water flush of 30 ml before and after each feeding  First feeding: 100 ml of Osmolite 1.5 cal Second feeding: 150 ml of Osmolite 1.5 cal Third feeding: 200 ml of Osmolite 1.5 cal Fourth feeding and goal: 237 ml (1 can) of Osmolite 1.5 ca  Goal bolus tube feeding regimen: - 1 can (237 ml) Osmolite 1.5 QID (total of 4 cans daily) - Provide free water flush of 30 ml before and after each feeding - Provide an additional free water flush of 100 ml TID between feeds (do not provide at the same time as bolus feeding)  Goal bolus tube feeding regimen and free water flushes provide 1422 kcal, 59 grams of protein, and 1262 ml of H2O.  - Provide liquid MVI daily via tube  NUTRITION DIAGNOSIS:   Inadequate oral intake related to lethargy/confusion, dysphagia, chronic illness (dementia) as evidenced by per patient/family report, meal completion < 25%.  Ongoing  GOAL:   Patient will meet greater than or equal to 90% of their needs  Met with TF regimen at goal volume  MONITOR:   Diet advancement, Labs, Skin, TF tolerance, Weight trends  REASON FOR ASSESSMENT:   Consult Assessment of nutrition requirement/status  ASSESSMENT:   78 year old female who presented to the ED on 1/17 with FTT and hypotension. PMH significant for CVA and right-sided weakness, dementia, HTN, HLD. Pt admitted with AKI.  MD paged RD regarding switching TF regimen to bolus as pt is to d/c with hospice. RD to place orders remotely.  Continue to suspect some degree of  malnutrition but unable to confirm at this time without completion of NFPE.  Medications reviewed and include: SSI, D5 in 0.45% saline @ 60 ml/hr (provides 245 kcal daily)  Labs reviewed. CBG's: 95, 100, 97, 103, 99 x 24 hours  NUTRITION - FOCUSED PHYSICAL EXAM:  Unable to complete. RD not on [facility] campus today.  Diet Order:   Diet Order            Diet NPO time specified Except for: Ice Chips  Diet effective now              EDUCATION NEEDS:   No education needs have been identified at this time  Skin:  Skin Assessment: Skin Integrity Issues: DTI: right ankle Stage II: right hip, sacrum Stage III: ischial tuberostiy Other: non-pressure wound to left leg  Last BM:  1/24  Height:   Ht Readings from Last 1 Encounters:  09/22/18 _0  (1.651 m)    Weight:   Wt Readings from Last 1 Encounters:  09/22/18 54.5 kg    Ideal Body Weight:  56.8 kg  BMI:  Body mass index is 19.99 kg/m.  Estimated Nutritional Needs:   Kcal:  1250-1450  Protein:  65-75 grams  Fluid:  1.3-1.5 L    Gaynell Face, MS, RD, LDN Inpatient Clinical Dietitian Pager: [phone] Weekend/After Hours: [phone]

## 2018-09-29 NOTE — Progress Notes (Signed)
Pt refused blood sugar check.

## 2018-09-29 NOTE — Progress Notes (Signed)
PROGRESS NOTE    Kendra Nixon  ZOX:[id number] DOB: Sep 19, 1940 DOA: 09/22/2018 PCP: Evaristo BuryShambley, [name], NP   Brief Narrative:  78 year old with history of previous CVA and right-sided weakness, dementia, hypertension, hyperlipidemia was sent from primary care provider office for worsening of failure to thrive, signs of dehydration leading to acute kidney injury and hyponatremia.  After extensive discussion with the family and previous hospitalist provider it was determined patient to get PEG tube.   Assessment & Plan:   Principal Problem:   AKI (acute kidney injury) (HCC) Active Problems:   Hyperlipidemia   Essential hypertension   Dementia (HCC)   CVA (cerebral vascular accident) (HCC)   Seizure-like activity (HCC)   Acute hypernatremia   Failure to thrive in adult   Pressure injury of skin   Goals of care, counseling/discussion   Palliative care by specialist   Encounter for hospice care discussion   Dehydration  Failure to thrive in adult Advanced dementia with infrequent delirium - Currently patient has PEG tube in place-we will plan on starting feedings today.  Prefer bolus feeding per hospice.  Will slowly increase it over next 24 hours, once at goal she can be discharged.  In the meantime we will monitor for refeeding syndrome. -I spoke with the dietitian who will place recommendations for bolus feeding and eventual goal -Continue Seroquel -Spoke with the palliative care team.  Patient can be transitioned to hospice over the weekend once we have achieved goal feeding.  Acute kidney injury with hypernatremia, resolved - This was likely result of poor oral intake.  This has improved with IV fluids.  History of seizures - Continue Depakote  History of CVA with right-sided weakness -Continue aspirin and statin  Multiple decubitus of sacral ulcers-stage II, present on admission - Wound care team following.  Encourage oral diet to improve nutrition to help with healing  process.  I have changed all the medications to per tube feeding.   DVT prophylaxis: Xarelto Code Status: DNR Family Communication: Daughter at bedside Disposition Plan: Transition to hospice in next 24 hours once she has achieved goal feeding in the meantime monitor for refeeding syndrome  Consultants:   Dilated care  IR  Procedures:   PEG tube placement 1/23  Antimicrobials:   Perioperative   Subjective: Patient is very cachectic frail laying in the bed.  Does not have any complaints.  Daughter is at the bedside  Review of Systems Otherwise negative except as per HPI, including: General: Denies fever, chills, night sweats or unintended weight loss. Resp: Denies cough, wheezing, shortness of breath. Cardiac: Denies chest pain, palpitations, orthopnea, paroxysmal nocturnal dyspnea. GI: Denies abdominal pain, nausea, vomiting, diarrhea or constipation GU: Denies dysuria, frequency, hesitancy or incontinence MS: Denies muscle aches, joint pain or swelling Neuro: Denies headache, neurologic deficits (focal weakness, numbness, tingling), abnormal gait Psych: Denies anxiety, depression, SI/HI/AVH Skin: Denies new rashes or lesions ID: Denies sick contacts, exotic exposures, travel  Objective: Vitals:   09/28/18 1251 09/28/18 1400 09/28/18 2212 09/29/18 0503  BP: 128/82 116/72 (!) 117/91 118/60  Pulse: 61 76 66 60  Resp:  (!) 28    Temp: 98.2 F (36.8 C)  98.6 F (37 C) 98.4 F (36.9 C)  TempSrc: Oral   Oral  SpO2: 94% 90% 93% 92%  Weight:      Height:        Intake/Output Summary (Last 24 hours) at 09/29/2018 1100 Last data filed at 09/28/2018 2200 Gross per 24 hour  Intake 283.1 ml  Output -  Net 283.1 ml   Filed Weights   09/22/18 1731  Weight: 54.5 kg    Examination:  General exam: Cachectic elderly frail laying in the bed.  Bilateral temporal wasting. Respiratory system: Poor respiratory effort, mild basilar crackles Cardiovascular system: S1 & S2  heard, RRR. No JVD, murmurs, rubs, gallops or clicks. No pedal edema. Gastrointestinal system: PEG tube site noted without any evidence of bleeding.  Dressing appears to be dry around that area. Central nervous system: Alert and oriented. No focal neurological deficits. Extremities: Symmetric 3 x 5 power. Skin: Stage II-3 sacral ulcers-no obvious evidence of infection Psychiatry: Poor judgment    Data Reviewed:   CBC: Recent Labs  Lab 09/22/18 1241 09/22/18 1852 09/23/18 0508 09/25/18 1111 09/26/18 0502 09/29/18 0604  WBC 11.6* 10.2 10.2 9.6 8.6 8.1  NEUTROABS 8.8*  --   --   --   --   --   HGB 13.2 12.4 12.4 9.9* 10.1* 10.1*  HCT 43.9 38.8 37.8 30.8* 31.5* 29.5*  MCV 95.0 89.6 88.3 87.0 86.8 84.8  PLT 170 160 136* 138* 177 234   Basic Metabolic Panel: Recent Labs  Lab 09/24/18 1700 09/25/18 1111 09/26/18 0502 09/27/18 0422 09/29/18 0604  NA 145 143 141 139 136  K 3.9 3.4* 3.4* 3.7 3.6  CL 115* 111 110 110 107  CO2 19* 21* 22 20* 19*  GLUCOSE 138* 304* 127* 107* 104*  BUN 15 12 10  7* 5*  CREATININE 0.76 0.87 0.71 0.70 0.83  CALCIUM 8.6* 8.2* 8.7* 8.7* 8.5*   GFR: Estimated Creatinine Clearance: 47.3 mL/min (by C-G formula based on SCr of 0.83 mg/dL). Liver Function Tests: Recent Labs  Lab 09/22/18 1241  AST 63*  ALT 26  ALKPHOS 71  BILITOT 1.2  PROT 7.5  ALBUMIN 2.9*   No results for input(s): LIPASE, AMYLASE in the last 168 hours. Recent Labs  Lab 09/22/18 1607  AMMONIA 38*   Coagulation Profile: Recent Labs  Lab 09/28/18 0432  INR 1.09   Cardiac Enzymes: Recent Labs  Lab 09/22/18 1852  CKTOTAL 194   BNP (last 3 results) Recent Labs    03/16/18 1341  PROBNP 90.0   HbA1C: No results for input(s): HGBA1C in the last 72 hours. CBG: Recent Labs  Lab 09/28/18 1603 09/28/18 1936 09/28/18 2343 09/29/18 0401 09/29/18 0819  GLUCAP 99 103* 97 100* 95   Lipid Profile: No results for input(s): CHOL, HDL, LDLCALC, TRIG, CHOLHDL,  LDLDIRECT in the last 72 hours. Thyroid Function Tests: No results for input(s): TSH, T4TOTAL, FREET4, T3FREE, THYROIDAB in the last 72 hours. Anemia Panel: No results for input(s): VITAMINB12, FOLATE, FERRITIN, TIBC, IRON, RETICCTPCT in the last 72 hours. Sepsis Labs: No results for input(s): PROCALCITON, LATICACIDVEN in the last 168 hours.  Recent Results (from the past 240 hour(s))  Urine Culture     Status: None   Collection Time: 09/22/18  3:23 PM  Result Value Ref Range Status   Specimen Description URINE, CATHETERIZED  Final   Special Requests NONE  Final   Culture   Final    NO GROWTH Performed at [facility], [address] [place], [place] [address]    Report Status 09/23/2018 FINAL  Final         Radiology Studies: Ir Gastrostomy Tube Mod Sed  Result Date: 09/28/2018 INDICATION: 78 year old female with advanced dementia, poor oral intake, protein calorie malnutrition and failure to thrive. EXAM: Fluoroscopically guided placement of percutaneous pull-through gastrostomy tube  Interventional Radiologist:  Sterling Big, MD MEDICATIONS: 2 g Ancef and 1 g glucagon; Antibiotics were administered within 1 hour of the procedure. ANESTHESIA/SEDATION: Versed 1 mg IV; Fentanyl 50 mcg IV Moderate Sedation Time:  12 minutes The patient was continuously monitored during the procedure by the interventional radiology nurse under my direct supervision. CONTRAST:  15 mL Isovue-300 FLUOROSCOPY TIME:  Fluoroscopy Time: 3 minutes 48 seconds (9 mGy). COMPLICATIONS: None immediate. PROCEDURE: Informed written consent was obtained from the patient after a thorough discussion of the procedural risks, benefits and alternatives. All questions were addressed. Maximal Sterile Barrier Technique was utilized including caps, mask, sterile gowns, sterile gloves, sterile drape, hand hygiene and skin antiseptic. A timeout was performed prior to the initiation of the procedure. Maximal barrier  sterile technique utilized including caps, mask, sterile gowns, sterile gloves, large sterile drape, hand hygiene, and chlorhexadine skin prep. An angled catheter was advanced over a wire under fluoroscopic guidance through the nose, down the esophagus and into the body of the stomach. The stomach was then insufflated with several 100 ml of air. Fluoroscopy confirmed location of the gastric bubble, as well as inferior displacement of the barium stained colon. Under direct fluoroscopic guidance, a single T-tack was placed, and the anterior gastric wall drawn up against the anterior abdominal wall. Percutaneous access was then obtained into the mid gastric body with an 18 gauge sheath needle. Aspiration of air, and injection of contrast material under fluoroscopy confirmed needle placement. An Amplatz wire was advanced in the gastric body and the access needle exchanged for a 9-French vascular sheath. A snare device was advanced through the vascular sheath and an Amplatz wire advanced through the angled catheter. The Amplatz wire was successfully snared and this was pulled up through the esophagus and out the mouth. A 20-French Burnell Blanks MIC-PEG tube was then connected to the snare and pulled through the mouth, down the esophagus, into the stomach and out to the anterior abdominal wall. Hand injection of contrast material confirmed intragastric location. The T-tack retention suture was then cut. The pull through peg tube was then secured with the external bumper and capped. The patient will be observed for several hours with the newly placed tube on low wall suction to evaluate for any post procedure complication. The patient tolerated the procedure well, there is no immediate complication. IMPRESSION: Successful placement of a 20 French pull through gastrostomy tube. Electronically Signed   By: Malachy Moan M.D.   On: 09/28/2018 12:42        Scheduled Meds: . aspirin  325 mg Oral Daily  .  atorvastatin  80 mg Oral QODAY  . feeding supplement (OSMOLITE 1.5 CAL)  237 mL Per Tube TID WC & HS  . insulin aspart  0-9 Units Subcutaneous Q4H  . multivitamin  15 mL Per Tube Daily  . QUEtiapine  12.5 mg Oral Daily  . QUEtiapine  25 mg Oral QHS  . rivaroxaban  15 mg Per Tube BID  . [START ON 09/30/2018] valproic acid  250 mg Per Tube BID   Continuous Infusions: . dextrose 5 % and 0.45% NaCl 60 mL/hr at 09/29/18 0401     LOS: 7 days   Time spent= 25 mins    Joziah Dollins Joline Maxcy, MD [facility]  If 7PM-7AM, please contact night-coverage [email or web address] 09/29/2018, 11:00 AM

## 2018-09-29 NOTE — Care Management Important Message (Signed)
Important Message  Patient Details  Name: Kendra Nixon MRN: [id number] Date of Birth: [birth year]   Medicare Important Message Given:  Yes    [name] [name] 09/29/2018, 11:26 AM

## 2018-09-30 LAB — GLUCOSE, CAPILLARY
GLUCOSE-CAPILLARY: 119 mg/dL — AB (ref 70–99)
GLUCOSE-CAPILLARY: 143 mg/dL — AB (ref 70–99)
Glucose-Capillary: 127 mg/dL — ABNORMAL HIGH (ref 70–99)
Glucose-Capillary: 95 mg/dL (ref 70–99)

## 2018-09-30 LAB — BASIC METABOLIC PANEL
Anion gap: 10 (ref 5–15)
BUN: 10 mg/dL (ref 8–23)
CO2: 19 mmol/L — AB (ref 22–32)
Calcium: 8.5 mg/dL — ABNORMAL LOW (ref 8.9–10.3)
Chloride: 109 mmol/L (ref 98–111)
Creatinine, Ser: 0.78 mg/dL (ref 0.44–1.00)
GFR calc Af Amer: >60 mL/min (ref >60)
GFR calc non Af Amer: >60 mL/min (ref >60)
Glucose, Bld: 94 mg/dL (ref 70–99)
Potassium: 3.9 mmol/L (ref 3.5–5.1)
Sodium: 138 mmol/L (ref 135–145)

## 2018-09-30 LAB — MAGNESIUM: Magnesium: 1.6 mg/dL — ABNORMAL LOW (ref 1.7–2.4)

## 2018-09-30 LAB — PHOSPHORUS: Phosphorus: 2.3 mg/dL — ABNORMAL LOW (ref 2.5–4.6)

## 2018-09-30 MED ORDER — OSMOLITE 1.5 CAL PO LIQD: 237.0000 mL | Freq: Three times a day (TID) | ORAL | 0 refills | Status: AC

## 2018-09-30 MED ORDER — ASPIRIN 325 MG PO TABS: 325.0000 mg | ORAL_TABLET | Freq: Every day | ORAL | 0 refills | Status: AC

## 2018-09-30 MED ORDER — AMLODIPINE BESYLATE 10 MG PO TABS: 10.0000 mg | ORAL_TABLET | Freq: Every day | ORAL | 0 refills | Status: DC

## 2018-09-30 MED ORDER — ADULT MULTIVITAMIN LIQUID CH: 15.0000 mL | Freq: Every day | ORAL | 0 refills | Status: AC

## 2018-09-30 MED ORDER — ATORVASTATIN CALCIUM 80 MG PO TABS: 80.0000 mg | ORAL_TABLET | Freq: Every day | ORAL | 0 refills | Status: AC

## 2018-09-30 MED ORDER — RIVAROXABAN (XARELTO) VTE STARTER PACK (15 & 20 MG): ORAL_TABLET | ORAL | 0 refills | Status: AC

## 2018-09-30 MED ORDER — MAGNESIUM OXIDE 400 (241.3 MG) MG PO TABS
400.0000 mg | ORAL_TABLET | Freq: Two times a day (BID) | ORAL | Status: DC
  Administered 2018-09-30: 400 mg
  Filled 2018-09-30: qty 1

## 2018-09-30 MED ORDER — MAGNESIUM SULFATE 2 GM/50ML IV SOLN
2.0000 g | Freq: Once | INTRAVENOUS | Status: DC
  Filled 2018-09-30: qty 50

## 2018-09-30 MED ORDER — VALPROIC ACID 250 MG/5ML PO SOLN: 250.0000 mg | Freq: Two times a day (BID) | ORAL | 0 refills | Status: AC

## 2018-09-30 NOTE — Progress Notes (Signed)
CSW was consulted by RN Vickie for transportation needs for pt to go home.  CSW has arranged for pt to be transported by [facility] at 2:00pm.  No further needs are noted.  CSW signing off.  Budd Palmer LCSWA [phone]

## 2018-09-30 NOTE — Progress Notes (Signed)
Nsg Discharge Note  Admit Date:  09/22/2018 Discharge date: 09/30/2018   Windell Mouldingunice Porcelli to be D/C'd Home with hospice per MD order.  AVS completed.  Copy for chart, and copy for patient signed, and dated. Patient/caregiver able to verbalize understanding.  Discharge Medication: Allergies as of 09/30/2018   No Known Allergies     Medication List    STOP taking these medications   amLODipine 10 MG tablet Commonly known as:  NORVASC   divalproex 250 MG 24 hr tablet Commonly known as:  DEPAKOTE ER   multivitamin tablet     TAKE these medications   aspirin 325 MG tablet Place 1 tablet (325 mg total) into feeding tube daily for 30 days. What changed:  how to take this   atorvastatin 80 MG tablet Commonly known as:  LIPITOR Place 1 tablet (80 mg total) into feeding tube daily at 6 PM for 30 days. What changed:    how to take this  when to take this   feeding supplement (OSMOLITE 1.5 CAL) Liqd Place 237 mLs into feeding tube 4 (four) times daily -  with meals and at bedtime for 30 days.   multivitamin Liqd Place 15 mLs into feeding tube daily for 30 days. Start taking on:  October 01, 2018   MUSCLE RUB 10-15 % Crea Apply 1 application topically as needed for muscle pain (shoulder pain).   QUEtiapine 25 MG tablet Commonly known as:  SEROQUEL Take 1/2 tablet in AM, then 1.5 tablets every night   Rivaroxaban 15 & 20 MG Tbpk Take as directed on package: Start with one 15mg  tablet by mouth twice a day with food. On Day 22, switch to one 20mg  tablet once a day with food.   valproic acid 250 MG/5ML Soln solution Commonly known as:  DEPAKENE Place 5 mLs (250 mg total) into feeding tube 2 (two) times daily for 30 days.            Durable Medical Equipment  (From admission, onward)         Start     Ordered   09/25/18 1310  For home use only DME Air overlay mattress  Once     09/25/18 1309           Discharge Care Instructions  (From admission, onward)         Start     Ordered   09/30/18 0000  Discharge wound care:    Comments:  G - tube site: Cleanse site with soap and water, dress with triple-antibiotic ointment, and cover with gauze for 7 days. Do not use scissors near the catheter.  Wound care to left medial lower extremity, right ischial tuberosity, sacrum and right hip:  Cleanse with NS, pat gently dry. Cover with saline moistened gauze, top with dry gauze and protect with silicone foam dressings. Change saline dressing twice daily, may reuse silicone cover dressing unless soiled or edges rolling.  Turn side to side.  Wound care to right ankle and left distal lower extremity areas of deep tissue pressure injury:  Cleanse with NS, and pat dry. Cover with silicone foam.  Change every 3 days per protocol, peeling back each shift to assess.   09/30/18 1221          Discharge Assessment: Vitals:   09/29/18 2100 09/30/18 0415  BP: 112/71 121/88  Pulse: 77 81  Resp: 20 18  Temp: 98.3 F (36.8 C) 97.8 F (36.6 C)  SpO2: 95% 97%   Skin  clean, dry and intact without evidence of skin break down, no evidence of skin tears noted. IV catheter discontinued intact. Site without signs and symptoms of complications - no redness or edema noted at insertion site, patient denies c/o pain - only slight tenderness at site.  Dressing with slight pressure applied.  D/c Instructions-Education: Discharge instructions given to patient/family with verbalized understanding. D/c education completed with patient/family including follow up instructions, medication list, d/c activities limitations if indicated, with other d/c instructions as indicated by MD - patient able to verbalize understanding, all questions fully answered. Patient instructed to return to ED, call 911, or call MD for any changes in condition.  Patient will be transported via [facility] with expected arrival time of 1400  Camillo Flaming, RN 09/30/2018 1:58 PM

## 2018-09-30 NOTE — Discharge Summary (Signed)
Physician Discharge Summary  Kendra Nixon WUJ:[id number] DOB: [birth year] DOA: 09/22/2018  PCP: Evaristo BuryShambley, [name], NP  Admit date: 09/22/2018 Discharge date: 09/30/2018  Time spent: 40 minutes  Recommendations for Outpatient Follow-up:  1. Follow up with hospice as outpatient   2. Further labs, treatment per family and goals of care with hospice 3. Wound care as noted below 4. Follow up tube feeds with hospice - was unable to get osmolite today at pharmacy.  Discussed with family and recommended ensure plus until she sees hospice.  Discussed with hospice who will follow up with her tomorrow and provide assistance then.  Discussed instructions for tube feeding/free water with daughter.  Goal bolus tube feeding regimen: - 1 can (237 ml) Osmolite 1.5 QID (total of 4 cans daily) - Provide free water flush of 30 ml before and after each feeding - Provide an additional free water flush of 100 ml TID between feeds (do not provide at the same time as bolus feeding)  Discharge Diagnoses:  Principal Problem:   AKI (acute kidney injury) (HCC) Active Problems:   Hyperlipidemia   Essential hypertension   Dementia (HCC)   CVA (cerebral vascular accident) (HCC)   Seizure-like activity (HCC)   Acute hypernatremia   Failure to thrive in adult   Pressure injury of skin   Goals of care, counseling/discussion   Palliative care by specialist   Encounter for hospice care discussion   Dehydration   Discharge Condition: stable  Diet recommendation: as noted below  [facility]   09/22/18 1731  Weight: 54.5 kg    History of present illness:  78 year old with history of previous CVA and right-sided weakness, dementia, hypertension, hyperlipidemia was sent from primary care provider office for worsening of failure to thrive, signs of dehydration leading to acute kidney injury and hyponatremia.  After extensive discussion with the family and previous hospitalist provider it was determined patient  to get PEG tube.  She was admitted for failure to thrive, dehydration.  She was found to have a left lower extremity DVT.  She was started on a heparin gtt and transitioned to xarelto on discharge.  Due to her poor PO intake, she had a PEG tube placed.  Palliative care was consulted and plan is for patient to be discharged with hospice following on bolus feeds.    Hospital Course:  Failure to thrive in adult Advanced dementia with infrequent delirium - Currently patient has PEG tube in place - She's being discharged on bolus feeds as noted above -Continue Seroquel -Spoke with the palliative care team.  Patient can be transitioned to hospice over the weekend once we have achieved goal feeding.  Acute kidney injury with hypernatremia, resolved - resolved  History of seizures - Continue Depakote  History of CVA with right-sided weakness -Continue aspirin and statin  Multiple decubitus of sacral ulcers-stage II, present on admission - Wound care team following.  Encourage oral diet to improve nutrition to help with healing process.  Left Lower extremity DVT: - continue xarelto  Procedures:  Palliative Care  IR  Consultations:  PEG tube placement on 1/23  Discharge Exam: Vitals:   09/29/18 2100 09/30/18 0415  BP: 112/71 121/88  Pulse: 77 81  Resp: 20 18  Temp: 98.3 F (36.8 C) 97.8 F (36.6 C)  SpO2: 95% 97%   Confused.  Agitated. Children at bedside. Discussed DNR.  Agreed to DNR (changed from partial code).  General: No acute distress. Cardiovascular: Heart sounds show a regular rate, and rhythm  Lungs: Clear to auscultation bilaterally Abdomen: Soft, nontender, nondistended  Neurological: Disoriented. Moves all extremities 4. Cranial nerves II through XII grossly intact. Skin: Several decubitus ulcers with foam dressings in place Extremities: No clubbing or cyanosis. No edema.   Discharge Instructions   Discharge Instructions    Discharge diet:    Complete by:  As directed    Diet recommendations: Dysphagia 1 (puree);Thin liquid Medication Administration: Crushed with puree Supervision: Full supervision/cueing for compensatory strategies            Oral Care Recommendations: Oral care BID   Discharge instructions   Complete by:  As directed    You were seen for dementia with failure to thrive.  A feeding tube was placed and you'll be discharged with bolus feeds.  Your medicines should be given crushed per tube.  We've stopped your amlodipine.  You also had a blood clot.  You were started on xarelto.  You'll take this twice daily (15 mg) for the next 20 days (you already completed 1 day of the twice daily dosing).  After completing this, you'll transition to 20 mg once daily.   Hospice will follow you after discharge.    Please ask your PCP to request records from this hospitalization so they know what was done and what the next steps will be.   Discharge wound care:   Complete by:  As directed    G - tube site: Cleanse site with soap and water, dress with triple-antibiotic ointment, and cover with gauze for 7 days. Do not use scissors near the catheter.  Wound care to left medial lower extremity, right ischial tuberosity, sacrum and right hip:  Cleanse with NS, pat gently dry. Cover with saline moistened gauze, top with dry gauze and protect with silicone foam dressings. Change saline dressing twice daily, may reuse silicone cover dressing unless soiled or edges rolling.  Turn side to side.  Wound care to right ankle and left distal lower extremity areas of deep tissue pressure injury:  Cleanse with NS, and pat dry. Cover with silicone foam.  Change every 3 days per protocol, peeling back each shift to assess.   Increase activity slowly   Complete by:  As directed    Increase activity slowly   Complete by:  As directed      Allergies as of 09/30/2018   No Known Allergies     Medication List    STOP taking  these medications   amLODipine 10 MG tablet Commonly known as:  NORVASC   divalproex 250 MG 24 hr tablet Commonly known as:  DEPAKOTE ER   multivitamin tablet     TAKE these medications   aspirin 325 MG tablet Place 1 tablet (325 mg total) into feeding tube daily for 30 days. What changed:  how to take this   atorvastatin 80 MG tablet Commonly known as:  LIPITOR Place 1 tablet (80 mg total) into feeding tube daily at 6 PM for 30 days. What changed:    how to take this  when to take this   feeding supplement (OSMOLITE 1.5 CAL) Liqd Place 237 mLs into feeding tube 4 (four) times daily -  with meals and at bedtime for 30 days.   multivitamin Liqd Place 15 mLs into feeding tube daily for 30 days. Start taking on:  October 01, 2018   MUSCLE RUB 10-15 % Crea Apply 1 application topically as needed for muscle pain (shoulder pain).   QUEtiapine 25 MG tablet Commonly known  as:  SEROQUEL Take 1/2 tablet in AM, then 1.5 tablets every night   Rivaroxaban 15 & 20 MG Tbpk Take as directed on package: Start with one 15mg  tablet by mouth twice a day with food. On Day 22, switch to one 20mg  tablet once a day with food.   valproic acid 250 MG/5ML Soln solution Commonly known as:  DEPAKENE Place 5 mLs (250 mg total) into feeding tube 2 (two) times daily for 30 days.            Durable Medical Equipment  (From admission, onward)         Start     Ordered   09/25/18 1310  For home use only DME Air overlay mattress  Once     09/25/18 1309           Discharge Care Instructions  (From admission, onward)         Start     Ordered   09/30/18 0000  Discharge wound care:    Comments:  G - tube site: Cleanse site with soap and water, dress with triple-antibiotic ointment, and cover with gauze for 7 days. Do not use scissors near the catheter.  Wound care to left medial lower extremity, right ischial tuberosity, sacrum and right hip:  Cleanse with NS, pat gently dry. Cover  with saline moistened gauze, top with dry gauze and protect with silicone foam dressings. Change saline dressing twice daily, may reuse silicone cover dressing unless soiled or edges rolling.  Turn side to side.  Wound care to right ankle and left distal lower extremity areas of deep tissue pressure injury:  Cleanse with NS, and pat dry. Cover with silicone foam.  Change every 3 days per protocol, peeling back each shift to assess.   09/30/18 1221         No Known Allergies    The results of significant diagnostics from this hospitalization (including imaging, microbiology, ancillary and laboratory) are listed below for reference.    Significant Diagnostic Studies: Ct Abdomen Wo Contrast  Result Date: 09/26/2018 CLINICAL DATA:  Evaluate anatomy for gastrostomy tube placement. Pt scanned Laterally with Right Side Down. EXAM: CT ABDOMEN WITHOUT CONTRAST TECHNIQUE: Multidetector CT imaging of the abdomen was performed following the standard protocol without IV contrast. COMPARISON:  None available FINDINGS: Lower chest: No acute abnormality. Hepatobiliary: No focal liver abnormality is seen. No gallstones, gallbladder wall thickening, or biliary dilatation. Pancreas: Unremarkable. No pancreatic ductal dilatation or surrounding inflammatory changes. Spleen: Normal in size without focal abnormality. Adrenals/Urinary Tract: Adrenal glands are unremarkable. Kidneys are normal, without renal calculi, focal lesion, or hydronephrosis. Stomach/Bowel: Stomach is nondistended. There is a safe percutaneous window for gastrostomy placement. Visualized portions of small bowel and colon are nondilated, unremarkable. Vascular/Lymphatic: Tortuous atheromatous aorta without aneurysm. No abdominal or mesenteric adenopathy. Other: No ascites. No free air. Musculoskeletal: Advanced degenerative disc disease in the visualized lower lumbar spine. Negative for fracture or worrisome bone lesion. IMPRESSION: 1.  Percutaneous  window present  for gastrostomy placement. 2. No acute findings. Electronically Signed   By: Corlis Leak M.D.   On: 09/26/2018 14:40   Ct Head Wo Contrast  Result Date: 09/22/2018 CLINICAL DATA:  78 year old female with encephalopathy. EXAM: CT HEAD WITHOUT CONTRAST TECHNIQUE: Contiguous axial images were obtained from the base of the skull through the vertex without intravenous contrast. COMPARISON:  Brain MRI dated 09/01/2016 and CT dated 08/31/2016 FINDINGS: Brain: There is moderate age-related atrophy and chronic microvascular ischemic changes. There  is no acute intracranial hemorrhage. No mass effect or midline shift. Old left occipital infarct. Vascular: No hyperdense vessel or unexpected calcification. Skull: Normal. Negative for fracture or focal lesion. Sinuses/Orbits: No acute finding. Other: None IMPRESSION: 1. No acute intracranial hemorrhage. 2. Moderate age-related atrophy and chronic microvascular ischemic changes. Old left occipital infarct. Electronically Signed   By: Elgie Collard M.D.   On: 09/22/2018 21:23   Ir Gastrostomy Tube Mod Sed  Result Date: 09/28/2018 INDICATION: 78 year old female with advanced dementia, poor oral intake, protein calorie malnutrition and failure to thrive. EXAM: Fluoroscopically guided placement of percutaneous pull-through gastrostomy tube Interventional Radiologist:  Sterling Big, MD MEDICATIONS: 2 g Ancef and 1 g glucagon; Antibiotics were administered within 1 hour of the procedure. ANESTHESIA/SEDATION: Versed 1 mg IV; Fentanyl 50 mcg IV Moderate Sedation Time:  12 minutes The patient was continuously monitored during the procedure by the interventional radiology nurse under my direct supervision. CONTRAST:  15 mL Isovue-300 FLUOROSCOPY TIME:  Fluoroscopy Time: 3 minutes 48 seconds (9 mGy). COMPLICATIONS: None immediate. PROCEDURE: Informed written consent was obtained from the patient after a thorough discussion of the procedural risks, benefits  and alternatives. All questions were addressed. Maximal Sterile Barrier Technique was utilized including caps, mask, sterile gowns, sterile gloves, sterile drape, hand hygiene and skin antiseptic. A timeout was performed prior to the initiation of the procedure. Maximal barrier sterile technique utilized including caps, mask, sterile gowns, sterile gloves, large sterile drape, hand hygiene, and chlorhexadine skin prep. An angled catheter was advanced over a wire under fluoroscopic guidance through the nose, down the esophagus and into the body of the stomach. The stomach was then insufflated with several 100 ml of air. Fluoroscopy confirmed location of the gastric bubble, as well as inferior displacement of the barium stained colon. Under direct fluoroscopic guidance, a single T-tack was placed, and the anterior gastric wall drawn up against the anterior abdominal wall. Percutaneous access was then obtained into the mid gastric body with an 18 gauge sheath needle. Aspiration of air, and injection of contrast material under fluoroscopy confirmed needle placement. An Amplatz wire was advanced in the gastric body and the access needle exchanged for a 9-French vascular sheath. A snare device was advanced through the vascular sheath and an Amplatz wire advanced through the angled catheter. The Amplatz wire was successfully snared and this was pulled up through the esophagus and out the mouth. A 20-French Burnell Blanks MIC-PEG tube was then connected to the snare and pulled through the mouth, down the esophagus, into the stomach and out to the anterior abdominal wall. Hand injection of contrast material confirmed intragastric location. The T-tack retention suture was then cut. The pull through peg tube was then secured with the external bumper and capped. The patient will be observed for several hours with the newly placed tube on low wall suction to evaluate for any post procedure complication. The patient tolerated the  procedure well, there is no immediate complication. IMPRESSION: Successful placement of a 20 French pull through gastrostomy tube. Electronically Signed   By: Malachy Moan M.D.   On: 09/28/2018 12:42   Dg Chest Port 1 View  Result Date: 09/22/2018 CLINICAL DATA:  Aspiration. EXAM: PORTABLE CHEST 1 VIEW COMPARISON:  Radiographs of August 31, 2016. FINDINGS: No pneumothorax or pleural effusion is noted. Stable cardiomediastinal silhouette. Both lungs are clear. The visualized skeletal structures are unremarkable. IMPRESSION: No acute cardiopulmonary abnormality seen. Electronically Signed   By: Lupita Raider, M.D.  On: 09/22/2018 13:17   Vas [place] Lower Extremity Venous (dvt) (only [facility])  Result Date: 09/24/2018  Lower Venous Study Indications: Swelling, and Edema.  Limitations: Extremely contracted body with stiffness, combativeness. Comparison Study: lower extremity venous duplex exam on 03/21/2018 Performing Technologist: Melodie Bouillon  Examination Guidelines: A complete evaluation includes B-mode imaging, spectral Doppler, color Doppler, and power Doppler as needed of all accessible portions of each vessel. Bilateral testing is considered an integral part of a complete examination. Limited examinations for reoccurring indications may be performed as noted.  Right Venous Findings: +---+---------------+---------+-----------+----------+-------------------------+    CompressibilityPhasicitySpontaneityPropertiesSummary                   +---+---------------+---------+-----------+----------+-------------------------+ CFV                                             unable to obtain due to                                                   extremely contracted                                                      body, patient refused to                                                  coorperate                 +---+---------------+---------+-----------+----------+-------------------------+  Left Venous Findings: +---------+---------------+---------+-----------+----------+-------------------+          CompressibilityPhasicitySpontaneityPropertiesSummary             +---------+---------------+---------+-----------+----------+-------------------+ CFV      None                    No                   Chronic             +---------+---------------+---------+-----------+----------+-------------------+ SFJ                                                   Not visualized      +---------+---------------+---------+-----------+----------+-------------------+ FV Prox                                               unable to obtain  due to extremely                                                          contracted body,                                                          patient refused to                                                        coorperate          +---------+---------------+---------+-----------+----------+-------------------+ FV Mid                                                unable to obtain                                                          due to extremely                                                          contracted body,                                                          patient refused to                                                        coorperate          +---------+---------------+---------+-----------+----------+-------------------+ FV Distal                                             unable to obtain                                                          due to extremely  contracted body,                                                           patient refused to                                                        coorperate          +---------+---------------+---------+-----------+----------+-------------------+ PFV                                                   Not visualized      +---------+---------------+---------+-----------+----------+-------------------+ POP      None           No       No                   Acute               +---------+---------------+---------+-----------+----------+-------------------+ PTV                                                   Not visualized      +---------+---------------+---------+-----------+----------+-------------------+ PERO                                                  Not visualized      +---------+---------------+---------+-----------+----------+-------------------+    Summary: Left: Findings consistent with acute deep vein thrombosis involving the left popliteal vein. Findings consistent with chronic deep vein thrombosis involving the left femoral vein.  *See table(s) above for measurements and observations. Electronically signed by Coral ElseVance Brabham MD on 09/24/2018 at 5:04:08 PM.    Final     Microbiology: Recent Results (from the past 240 hour(s))  Urine Culture     Status: None   Collection Time: 09/22/18  3:23 PM  Result Value Ref Range Status   Specimen Description URINE, CATHETERIZED  Final   Special Requests NONE  Final   Culture   Final    NO GROWTH Performed at [facility], [address] [place], [place] [address]    Report Status 09/23/2018 FINAL  Final     Labs: Basic Metabolic Panel: Recent Labs  Lab 09/25/18 1111 09/26/18 0502 09/27/18 0422 09/29/18 0604 09/30/18 0339  NA 143 141 139 136 138  K 3.4* 3.4* 3.7 3.6 3.9  CL 111 110 110 107 109  CO2 21* 22 20* 19* 19*  GLUCOSE 304* 127* 107* 104* 94  BUN 12 10 7* 5* 10  CREATININE 0.87 0.71 0.70 0.83 0.78  CALCIUM 8.2* 8.7* 8.7* 8.5* 8.5*  MG  --   --   --   --   1.6*  PHOS  --   --   --   --  2.3*   Liver Function Tests: No results for input(s): AST, ALT, ALKPHOS, BILITOT, PROT, ALBUMIN in the last 168 hours. No results for input(s): LIPASE, AMYLASE in the last 168 hours. No results for input(s): AMMONIA in the last 168 hours. CBC: Recent Labs  Lab 09/25/18 1111 09/26/18 0502 09/29/18 0604  WBC 9.6 8.6 8.1  HGB 9.9* 10.1* 10.1*  HCT 30.8* 31.5* 29.5*  MCV 87.0 86.8 84.8  PLT 138* 177 234   Cardiac Enzymes: No results for input(s): CKTOTAL, CKMB, CKMBINDEX, TROPONINI in the last 168 hours. BNP: BNP (last 3 results) No results for input(s): BNP in the last 8760 hours.  ProBNP (last 3 results) Recent Labs    03/16/18 1341  PROBNP 90.0    CBG: Recent Labs  Lab 09/29/18 1942 09/30/18 0118 09/30/18 0412 09/30/18 0759 09/30/18 1219  GLUCAP 81 127* 95 119* 143*       Signed:  Lacretia Nicks MD.  [facility] 09/30/2018, 12:23 PM

## 2018-09-30 NOTE — Plan of Care (Signed)
  Problem: Education: Goal: Knowledge of General Education information will improve Description Including pain rating scale, medication(s)/side effects and non-pharmacologic comfort measures Outcome: Completed/Met   Problem: Health Behavior/Discharge Planning: Goal: Ability to manage health-related needs will improve Outcome: Completed/Met   Problem: Clinical Measurements: Goal: Ability to maintain clinical measurements within normal limits will improve Outcome: Completed/Met Goal: Will remain free from infection Outcome: Completed/Met Goal: Diagnostic test results will improve Outcome: Completed/Met Goal: Cardiovascular complication will be avoided Outcome: Completed/Met   Problem: Activity: Goal: Risk for activity intolerance will decrease Outcome: Completed/Met   Problem: Nutrition: Goal: Adequate nutrition will be maintained Outcome: Completed/Met   Problem: Coping: Goal: Level of anxiety will decrease Outcome: Completed/Met   Problem: Elimination: Goal: Will not experience complications related to bowel motility Outcome: Completed/Met Goal: Will not experience complications related to urinary retention Outcome: Completed/Met   Problem: Skin Integrity: Goal: Risk for impaired skin integrity will decrease Outcome: Completed/Met

## 2018-09-30 NOTE — Discharge Instructions (Signed)
Information on my medicine - XARELTO (rivaroxaban)  This medication education was reviewed with me or my healthcare representative as part of my discharge preparation.  The pharmacist that spoke with me during my hospital stay was:  Varney Baas Swade [place], [facility]  WHY WAS Kendra Nixon PRESCRIBED FOR YOU? Xarelto was prescribed to treat blood clots that may have been found in the veins of your legs (deep vein thrombosis) or in your lungs (pulmonary embolism) and to reduce the risk of them occurring again.  What do you need to know about Xarelto? The starting dose is one 15 mg tablet taken TWICE daily with food for the FIRST 21 DAYS then on 10/20/18 the dose is changed to one 20 mg tablet taken ONCE A DAY with your evening meal.  DO NOT stop taking Xarelto without talking to the health care provider who prescribed the medication.  Refill your prescription for 20 mg tablets before you run out.  After discharge, you should have regular check-up appointments with your healthcare provider that is prescribing your Xarelto.  In the future your dose may need to be changed if your kidney function changes by a significant amount.  What do you do if you miss a dose? If you are taking Xarelto TWICE DAILY and you miss a dose, take it as soon as you remember. You may take two 15 mg tablets (total 30 mg) at the same time then resume your regularly scheduled 15 mg twice daily the next day.  If you are taking Xarelto ONCE DAILY and you miss a dose, take it as soon as you remember on the same day then continue your regularly scheduled once daily regimen the next day. Do not take two doses of Xarelto at the same time.   Important Safety Information Xarelto is a blood thinner medicine that can cause bleeding. You should call your healthcare provider right away if you experience any of the following: ? Bleeding from an injury or your nose that does not stop. ? Unusual colored urine (red or dark brown) or unusual colored  stools (red or black). ? Unusual bruising for unknown reasons. ? A serious fall or if you hit your head (even if there is no bleeding).  Some medicines may interact with Xarelto and might increase your risk of bleeding while on Xarelto. To help avoid this, consult your healthcare provider or pharmacist prior to using any new prescription or non-prescription medications, including herbals, vitamins, non-steroidal anti-inflammatory drugs (NSAIDs) and supplements.  This website has more information on Xarelto: VisitDestination.com.br.

## 2018-09-30 NOTE — Progress Notes (Signed)
Spoke w patient's family in room and daughter over the phone. They are ready to DC to home w hospice. Verified w [facility] that all equipment etc is at the home. [facility] has been called for transport. Family given Xaralto 30 day coupon.

## 2018-10-02 ENCOUNTER — Telehealth: Payer: Self-pay | Admitting: *Deleted

## 2018-10-02 ENCOUNTER — Ambulatory Visit: Payer: Self-pay | Admitting: *Deleted

## 2018-10-02 NOTE — Telephone Encounter (Signed)
Pt was on TCM report admitted 09/22/18  for failure to thrive, dehydration.  She was found to have a left lower extremity DVT.  She was started on a heparin gtt and transitioned to xarelto on discharge.  Due to her poor PO intake, she had a PEG tube placed.  Palliative care was consulted and patient discharged 09/30/18 with hospice care following on bolus feeds...Raechel Chute/lmb

## 2018-10-02 NOTE — Telephone Encounter (Signed)
Per initial encounter 10/02/2018 at 0730, "Patient Daughter Kendra Nixon [phone] calling stating that the hospital gave her mother a feeding tube and they didn't send any food to pharmacy then they said that hospice was going to give her some and Hospice said that they not set up for that she has a little bit of cleat fluid to flush food down but she doesn't have any food Daughter was told to call to get food for mother she has not had anything to eat since Saturday she was told to give her mother ensure but it keep coming up out of her mouth the hospital told her to take ensure but the nurse from Hospice told her not to give it to her anymore until Daughter Kendra Nixon talk to her Mothers provider please daughter Kendra Nixon back at [phone]"; Medication list shows nutritional supplement sent to [facility] #[id number]; attempted to contact pt's daughter; unable to leave message; pre-recorded message states "the mailbox is full" will route to office for final disposition.

## 2018-10-02 NOTE — Telephone Encounter (Signed)
Pt's daughter, [name], called back and expressed multiple concerns:  1.) Pt has no tube feeding; they bought ensure until they received the prescription for correct food 2.) Pt was having drainage of the ensure tube feeding from her mouth; the pt had 4 episodes of this on 10/01/2018; they notified the hospice nurse and was instructed not to feed pt until they talk with her PCP  3.) The pharmacy says that they did not receive the order for osmolyte tube feeding; medication list show that order was sent to [facility] #[id number]   4.) In addition to not having the tube feeding; they only have a little bit of flush for the tube feeding 5.) Pt was having rattling in chest; hospice RN started the pt on oxygen on 10/01/2018 and started the pt on mucinex (symptoms have improved  [name] can be contacted at [phone]; will route to office for notification and  final disposition       Reason for Disposition . [1] Prescription not at pharmacy AND [2] was prescribed today by PCP  Answer Assessment - Initial Assessment Questions 1. SYMPTOMS: "Do you have any symptoms?"     Tube feeding coming back up  2. SEVERITY: If symptoms are present, ask "Are they mild, moderate or severe?"     n/a  Protocols used: MEDICATION QUESTION CALL-A-AH

## 2018-10-02 NOTE — Telephone Encounter (Signed)
I spoke to Drea at [facility]- they can special order the Osmolite feeding supplement, but she can only get 5000 ml for $50 and this can not be billed to ins/Medicare. Patient's daughter informed. She is requesting this be sent to [facility].   I called [facility] @ [phone] and spoke to [place].   I called [facility] and spoke to [place].  I gave her the order for Osmolite per script from Hospital- she states she will get this ordered with [facility] and take care of it.  [phone]. Patient's daughter, [name] informed.

## 2018-10-02 NOTE — Telephone Encounter (Signed)
Can you reach out to home/hospice nurse to find out what they are requesting from me at this time?

## 2018-10-06 ENCOUNTER — Telehealth: Payer: Self-pay | Admitting: Emergency Medicine

## 2018-10-06 NOTE — Telephone Encounter (Signed)
Copied from CRM [id number]. Topic: General - Other >> November 03, 2018  3:14 PM Jaquita Rector A wrote: Gillian Scarce called from [facility] called to report the patient passing away at 2.50 p.m. this afternoon.

## 2018-10-07 DEATH — deceased

## 2018-10-09 ENCOUNTER — Telehealth: Payer: Self-pay | Admitting: Nurse Practitioner

## 2018-10-09 NOTE — Telephone Encounter (Unsigned)
Copied from CRM [phone]. Topic: General - Other >> Oct 09, 2018 10:53 AM Jilda Rocheemaray, Melissa wrote: Reason for CRM: Angelique Blonderenise from [facility] called and is asking who is going to sign her death certificate? Please advise  Best call back is [phone]

## 2018-10-11 ENCOUNTER — Telehealth: Payer: Self-pay | Admitting: *Deleted

## 2018-10-11 NOTE — Telephone Encounter (Signed)
Received D/C from [facility], D/C forwarded to [employer] to be signed .

## 2018-10-11 NOTE — Telephone Encounter (Signed)
Received signed D/C [facility] notified for Pick up

## 2018-10-30 IMAGING — MR MR HEAD W/O CM
9 of 11 series · 31 of 48 positions shown · non-contrast
Comparison: CT HEAD August 31, 2016

CLINICAL DATA: Confusion and agitation. Word-finding difficulty and
incontinence. Assess stroke. History of hypertension,
hyperlipidemia, dementia.

EXAM:
MRI HEAD WITHOUT CONTRAST
MRA HEAD WITHOUT CONTRAST
TECHNIQUE: Multiplanar, multiecho pulse sequences of the brain and surrounding
structures were obtained without intravenous contrast. Angiographic
images of the head were obtained using MRA technique without
contrast.

[Series 3: DWI · axial · 3.0mm · 1.09mm/px · z∈[-44,+100]mm · 7 of 98 slices shown (1 of 4)]
[im 1/98]
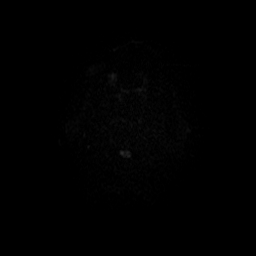
[im 17/98]
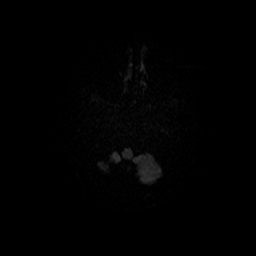
[im 33/98]
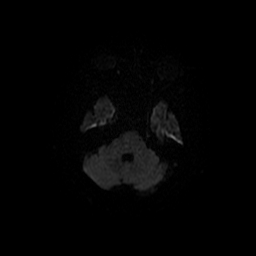
[im 49/98]
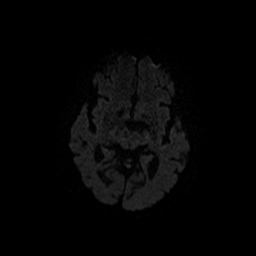
[im 65/98]
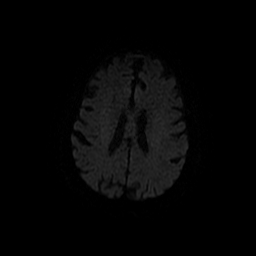
[im 81/98]
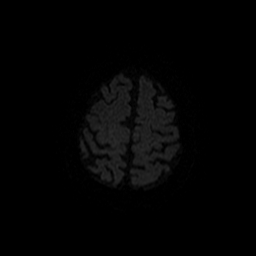
[im 98/98]
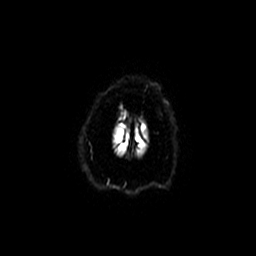

[Series 4: (id) mt fs · axial · 1.4mm · 0.43mm/px · z∈[-40,-2]mm · 4 of 136 slices shown]
[im 1/136]
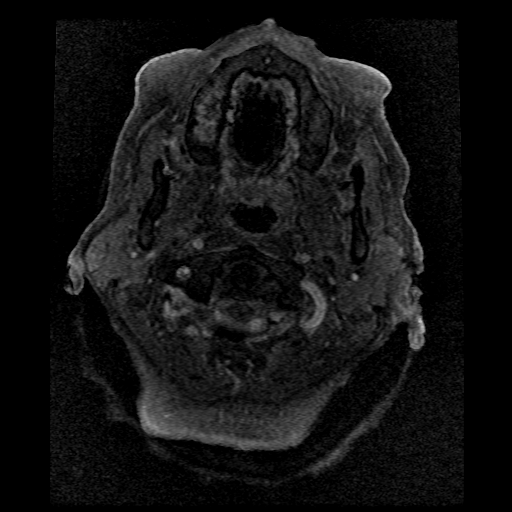
[im 28/136]
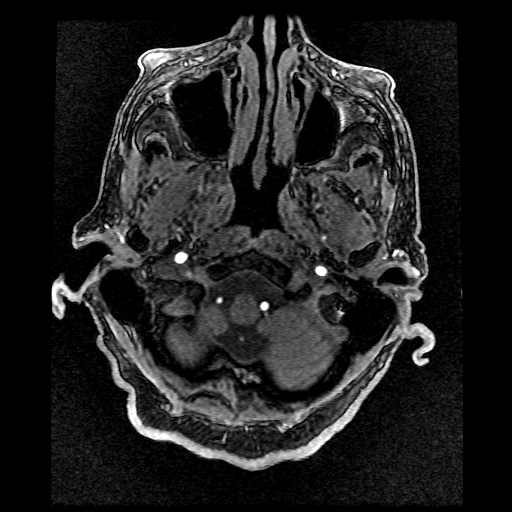
[im 41/136]
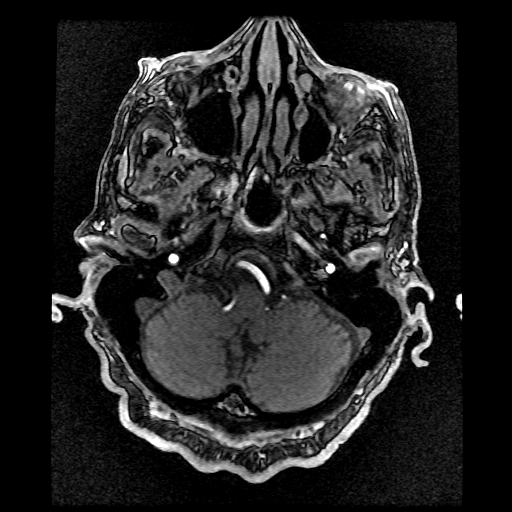
[im 55/136]
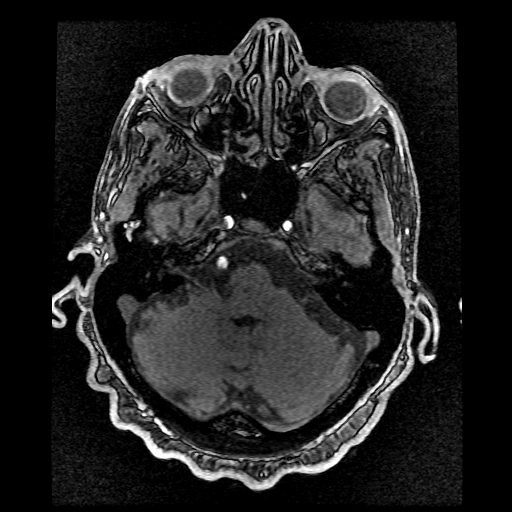

[Series 5: DWI · coronal · 5.0mm · 1.09mm/px · 5 of 66 slices shown (2 of 4)]
[im 1/66]
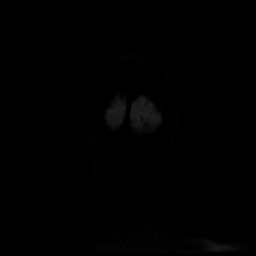
[im 17/66]
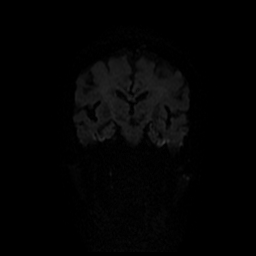
[im 33/66]
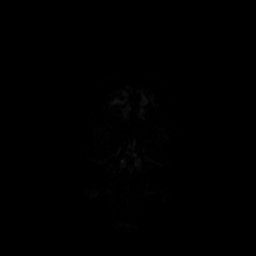
[im 49/66]
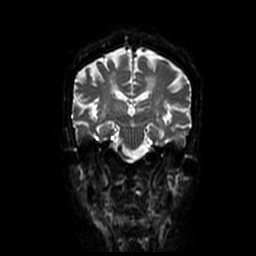
[im 66/66]
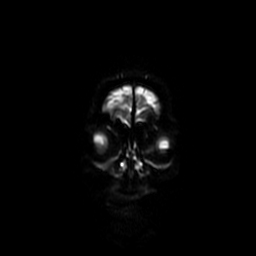

[Series 6: T1 · sagittal · 5.0mm · 0.47mm/px · 2 of 23 slices shown]
[im 1/23]
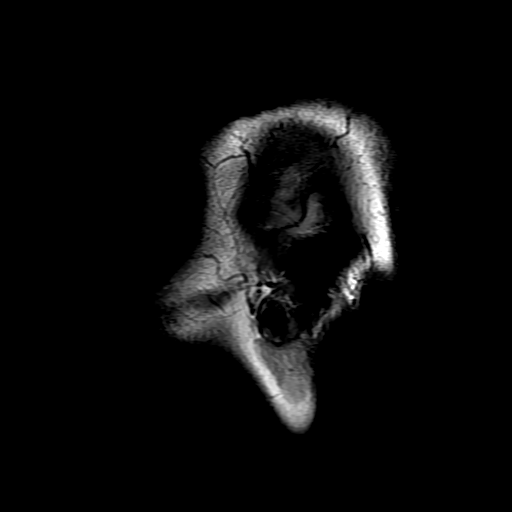
[im 23/23]
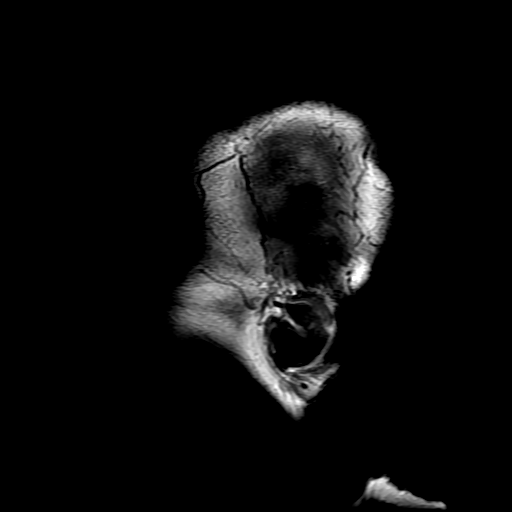

[Series 7: T2 · axial · 5.0mm · 0.43mm/px · z∈[-48,+102]mm · 2 of 26 slices shown (1 of 2)]
[im 1/26]
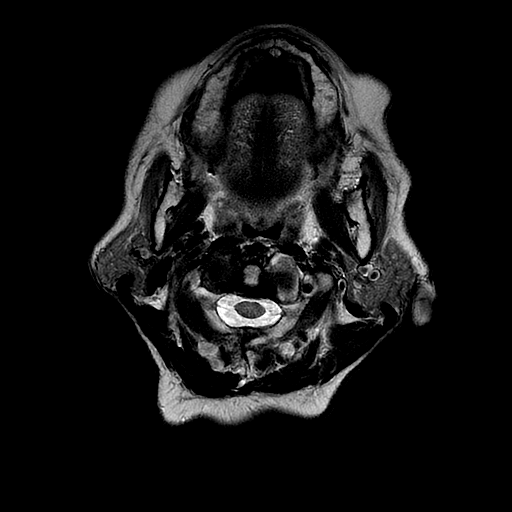
[im 26/26]
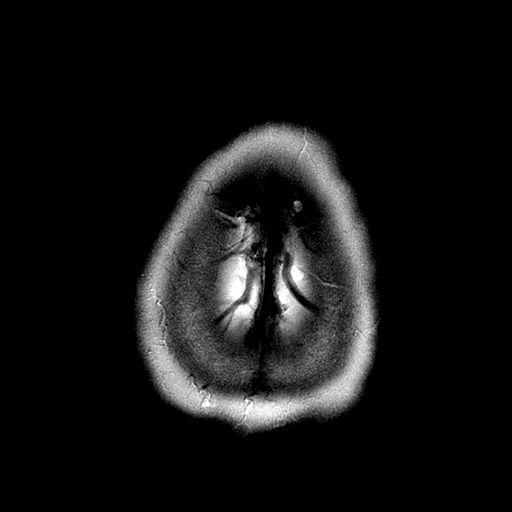

[Series 8: FLAIR · axial · 5.0mm · 0.43mm/px · z∈[-48,+102]mm · 2 of 26 slices shown]
[im 1/26]
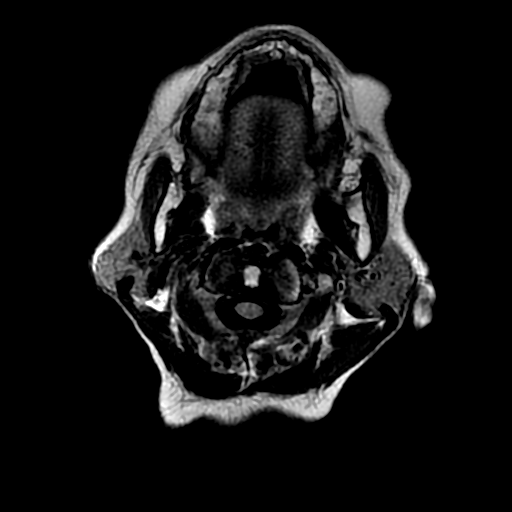
[im 26/26]
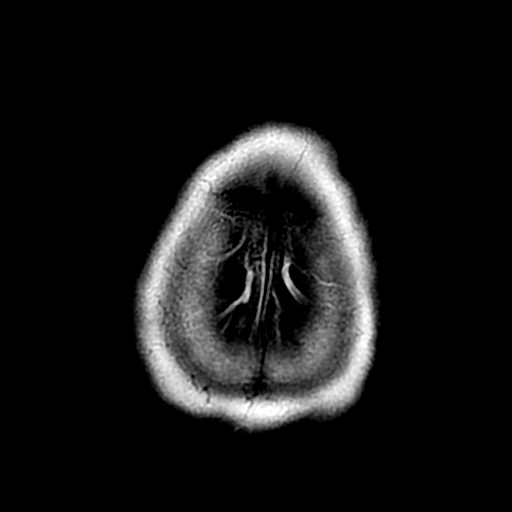

[Series 11: T2 · coronal · 5.0mm · 0.43mm/px · 2 of 28 slices shown (2 of 2)]
[im 1/28]
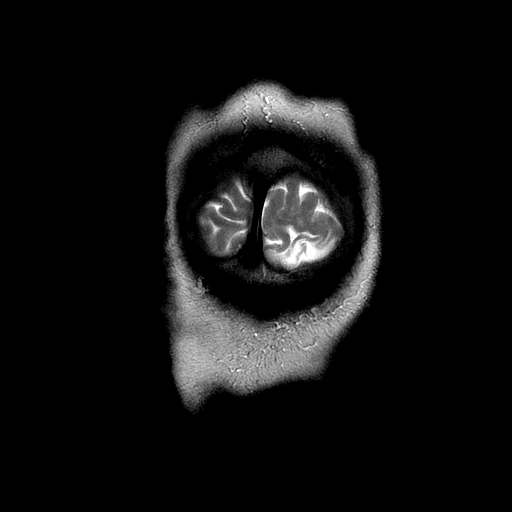
[im 28/28]
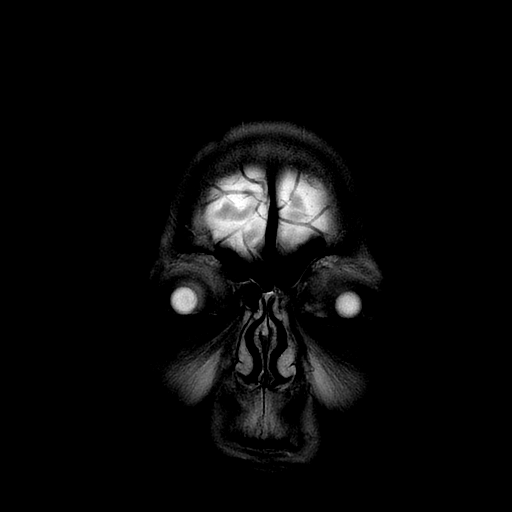

[Series 300: DWI · axial · 3.0mm · 1.09mm/px · z∈[-44,+100]mm · 4 of 49 slices shown (3 of 4)]
[im 1/49]
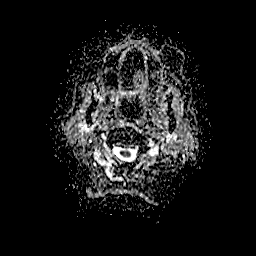
[im 17/49]
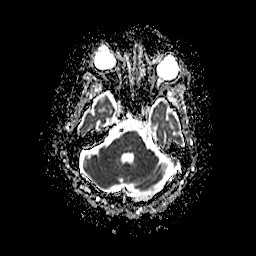
[im 33/49]
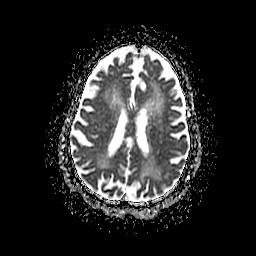
[im 49/49]
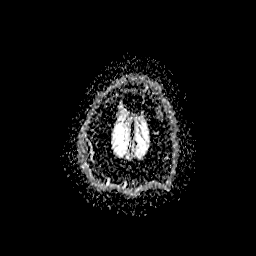

[Series 500: DWI · coronal · 5.0mm · 1.09mm/px · 3 of 33 slices shown (4 of 4)]
[im 1/33]
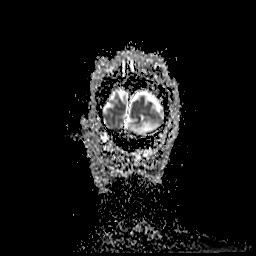
[im 17/33]
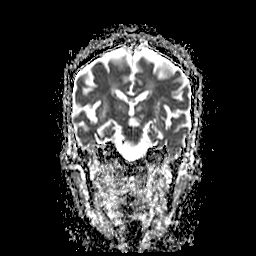
[im 33/33]
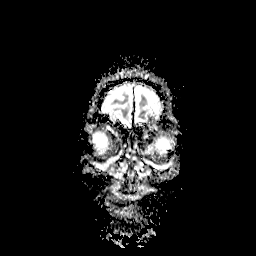

[31 of 48 positions shown; findings below may reference images not displayed]

FINDINGS: MRI HEAD FINDINGS

BRAIN: 3 mm focus of reduced diffusion LEFT posterior thalamus with
low ADC values. Numerous chronic microhemorrhage in the deep gray
nuclei associated with chronic hypertension. Old LEFT occipital lobe
infarct with susceptibility artifact most compatible with
mineralization. Multiple old bilateral basal ganglia and thalamus
lacunar infarcts. Mild ex vacuo dilatation LEFT lateral ventricle,
no hydrocephalus. Patchy to confluent supratentorial and pontine
white matter FLAIR T2 hyperintensities without midline shift, mass
effect or masses.

VASCULAR: Dolichoectatic major intracranial vascular flow voids
present at skull base.

SKULL AND UPPER CERVICAL SPINE: No abnormal sellar expansion. No
suspicious calvarial bone marrow signal. Craniocervical junction
maintained. Moderate C3-4 broad-based disc osteophyte complex
resulting in moderate canal stenosis with ventral cord deformity.
Trace LEFT atlanto axial joint effusion.

SINUSES/ORBITS: Trace paranasal sinus mucosal thickening. Mildly
atretic LEFT maxillary sinus most compatible with chronic sinusitis.
Mastoid air cells are well aerated. Status post bilateral ocular
lens implants. The included ocular globes and orbital contents are
non-suspicious.

OTHER: Patient is edentulous.

MRA HEAD FINDINGS

ANTERIOR CIRCULATION: Normal flow related enhancement of the
included cervical, petrous, cavernous and supraclinoid internal
carotid arteries. Patent anterior communicating artery with like
coli fenestration. Normal flow related enhancement of the anterior
and middle cerebral arteries, including distal segments. Mild
probable stenosis RIGHT M2 origin. Asymmetrically decreased number
of distal RIGHT middle cerebral artery segments, attributed to
slight patient tilt in the scanner.

No large vessel occlusion, high-grade stenosis, abnormal luminal
irregularity, aneurysm.

POSTERIOR CIRCULATION: LEFT vertebral artery is dominant.
Dolicoectatic basilar artery. Basilar artery is patent, with flow
related enhancement of the main branch vessels. Likely stenotic LEFT
superior cerebellar origin. Normal flow related enhancement of the
posterior cerebral arteries.

No large vessel occlusion, high-grade stenosis, abnormal luminal
irregularity, aneurysm.
IMPRESSION: MRI HEAD: Acute 3 mm LEFT thalamus lacunar infarct.

Findings of chronic hypertension including moderate to severe
chronic small vessel ischemic disease and old deep gray nuclei
lacunar infarcts.

Old LEFT occipital lobe/PCA territory infarct.

MRA HEAD: No emergent large vessel occlusion.

Suspected stenosis LEFT superior cerebellar origin and mildly
stenotic RIGHT M2 origin.

## 2018-11-10 ENCOUNTER — Encounter

## 2018-11-10 ENCOUNTER — Ambulatory Visit: Payer: Medicare Other | Admitting: Neurology
